# Patient Record
Sex: Female | Born: 1995 | Hispanic: No | Marital: Single | Smoking: Never smoker
Health system: Southern US, Community
[De-identification: ages and names within clinical notes are randomized; demographics above are authoritative.]

## PROBLEM LIST (undated history)

## (undated) ENCOUNTER — Inpatient Hospital Stay (HOSPITAL_COMMUNITY): Payer: Self-pay

## (undated) DIAGNOSIS — F329 Major depressive disorder, single episode, unspecified: Secondary | ICD-10-CM

## (undated) DIAGNOSIS — F419 Anxiety disorder, unspecified: Secondary | ICD-10-CM

## (undated) DIAGNOSIS — F319 Bipolar disorder, unspecified: Secondary | ICD-10-CM

## (undated) DIAGNOSIS — D649 Anemia, unspecified: Secondary | ICD-10-CM

## (undated) DIAGNOSIS — O2301 Infections of kidney in pregnancy, first trimester: Secondary | ICD-10-CM

## (undated) DIAGNOSIS — F32A Depression, unspecified: Secondary | ICD-10-CM

## (undated) DIAGNOSIS — R51 Headache: Secondary | ICD-10-CM

## (undated) DIAGNOSIS — R519 Headache, unspecified: Secondary | ICD-10-CM

## (undated) HISTORY — DX: Anxiety disorder, unspecified: F41.9

## (undated) HISTORY — PX: TONSILLECTOMY: SUR1361

## (undated) HISTORY — DX: Infections of kidney in pregnancy, first trimester: O23.01

---
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
## 2017-01-02 ENCOUNTER — Encounter: Payer: Self-pay | Admitting: Family Medicine

## 2017-02-02 ENCOUNTER — Ambulatory Visit: Payer: Medicaid Other | Admitting: Family Medicine

## 2017-02-15 ENCOUNTER — Encounter: Payer: Self-pay | Admitting: Family Medicine

## 2017-02-15 ENCOUNTER — Other Ambulatory Visit (HOSPITAL_COMMUNITY)
Admission: RE | Admit: 2017-02-15 | Discharge: 2017-02-15 | Disposition: A | Discharge status: Home / Self Care | Payer: Medicaid Other | Source: Ambulatory Visit | Attending: Family Medicine | Admitting: Family Medicine

## 2017-02-15 ENCOUNTER — Ambulatory Visit (INDEPENDENT_AMBULATORY_CARE_PROVIDER_SITE_OTHER): Payer: Medicaid Other | Admitting: Internal Medicine

## 2017-02-15 ENCOUNTER — Encounter: Payer: Self-pay | Admitting: Internal Medicine

## 2017-02-15 VITALS — BP 122/80 | HR 57 | Temp 98.7°F | Ht 62.0 in | Wt 129.8 lb

## 2017-02-15 DIAGNOSIS — N898 Other specified noninflammatory disorders of vagina: Secondary | ICD-10-CM | POA: Diagnosis present

## 2017-02-15 LAB — POCT WET PREP (WET MOUNT)
CLUE CELLS WET PREP WHIFF POC: POSITIVE
TRICHOMONAS WET PREP HPF POC: ABSENT

## 2017-02-15 MED ORDER — METRONIDAZOLE 500 MG PO TABS: 500.0000 mg | ORAL_TABLET | Freq: Two times a day (BID) | ORAL | 0 refills | Status: DC

## 2017-02-15 NOTE — Progress Notes (Signed)
   Subjective:    Tara Gonzalez [place] - 21 y.o. female MRN [id number]  Date of birth: 08/05/1996  HPI  Tara Gonzalez Strayer initially presents for concern for UTI. Reports that symptoms are white, thick vaginal discharge without pruritis. Very minimal odor to the discharge. She denies dysuria, urinary frequency, and urgency. Initially declines STD testing but then asks to be tested. Denies fevers, chills, back pain, nausea and vomiting.   -  reports that she has never smoked. She has never used smokeless tobacco. - Review of Systems: Per HPI. - Past Medical History: Patient Active Problem List   Diagnosis Date Noted  . Grief reaction 09/11/2015  . Abnormal liver function tests 08/01/2015  . Supervision of normal pregnancy in third trimester 05/13/2015  . Family history of high cholesterol 04/16/2013  . Bipolar disorder (HCC) 05/02/2012   - Medications: reviewed and updated   Objective:   Physical Exam BP 122/80 (BP Location: Left Arm, Patient Position: Sitting, Cuff Size: Normal)   Pulse (!) 57   Temp 98.7 F (37.1 C) (Oral)   Ht 5\' 2"  (1.575 m)   Wt 129 lb 12.8 oz (58.9 kg)   SpO2 98%   BMI 23.74 kg/m  Gen: NAD, alert, cooperative with exam, well-appearing Abd: SNTND, BS present, no guarding or organomegaly  Assessment & Plan:   1. Vaginal discharge Patient refused pelvic exam but was willing to perform self collected swabs. Although she initially presented with concern for UTI, patient does not have any symptoms concerning for cystitis and she also refused UA collection. We prep demonstrated BV.  - POCT Wet Prep [facility](Wet [place]) - Cervicovaginal ancillary only - metroNIDAZOLE (FLAGYL) 500 MG tablet; Take 1 tablet (500 mg total) by mouth 2 (two) times daily.  Dispense: 14 tablet; Refill: 0   Marcy Sirenatherine Alezandra Egli, D.O. 02/15/2017, 10:59 AM PGY-2, [facility]

## 2017-02-16 LAB — CERVICOVAGINAL ANCILLARY ONLY
CHLAMYDIA, DNA PROBE: NEGATIVE
NEISSERIA GONORRHEA: NEGATIVE

## 2017-02-23 ENCOUNTER — Encounter: Payer: Self-pay | Admitting: *Deleted

## 2017-03-28 ENCOUNTER — Ambulatory Visit: Payer: Medicaid Other | Admitting: Student in an Organized Health Care Education/Training Program

## 2017-03-28 NOTE — Progress Notes (Deleted)
   CC: ***  HPI: Tara Gonzalez is a 21 y.o. female with PMH significant for bipolar disorder, family history of high cholesterol who [facility] with *** of *** duration.   Overdue health maintenance ***  Review of Symptoms:  See HPI for ROS.   CC, SH/smoking status, and VS noted.  Objective: There were no vitals taken for this visit. GEN: NAD, alert, cooperative, and pleasant.*** EYE: no conjunctival injection, pupils equally round and reactive to light ENMT: normal tympanic light reflex, no nasal polyps,no rhinorrhea, no pharyngeal erythema or exudates NECK: full ROM, no thyromegaly RESPIRATORY: clear to auscultation bilaterally with no wheezes, rhonchi or rales, good effort CV: RRR, no m/r/g, no peripheral edema GI: soft, non-tender, non-distended, no hepatosplenomegaly SKIN: warm and dry, no rashes or lesions NEURO: II-XII grossly intact, normal gait, peripheral sensation intact PSYCH: AAOx3, appropriate affect  Flu Vaccine: *** Tdap Vaccine: *** - every 9527yrs - (<3 lifetime doses or unknown): all wounds -- look up need for Tetanus IG - (>=3 lifetime doses): clean/minor wound if >6027yrs from previous; all other wounds if >476yrs from previous Zoster Vaccine: *** (those >50yo, once) Pneumonia Vaccine: *** (those w/ risk factors) - (<4475yr) Both: Immunocompromised, cochlear implant, CSF leak, asplenic, sickle cell, Chronic Renal Failure - (<1475yr) PPSV-23 only: Heart dz, lung disease, DM, tobacco abuse, alcoholism, cirrhosis/liver disease. - (>6475yr): PPSV13 then PPSV23 in 6-12mths;  - (>3775yr): repeat PPSV23 once if pt received prior to 19yo and 176yrs have passed  Assessment and plan:  No problem-specific Assessment & Plan notes found for this encounter.   No orders of the defined types were placed in this encounter.   No orders of the defined types were placed in this encounter.    Howard PouchLauren Ayden Hardwick, MD,MS,  PGY2 03/28/2017 5:29 AM

## 2017-03-30 ENCOUNTER — Encounter: Payer: Self-pay | Admitting: Student in an Organized Health Care Education/Training Program

## 2017-03-30 ENCOUNTER — Ambulatory Visit (INDEPENDENT_AMBULATORY_CARE_PROVIDER_SITE_OTHER): Payer: Medicaid Other | Admitting: Student in an Organized Health Care Education/Training Program

## 2017-03-30 VITALS — BP 120/52 | HR 85 | Temp 98.5°F | Wt 130.8 lb

## 2017-03-30 DIAGNOSIS — Z3491 Encounter for supervision of normal pregnancy, unspecified, first trimester: Secondary | ICD-10-CM | POA: Diagnosis not present

## 2017-03-30 DIAGNOSIS — N912 Amenorrhea, unspecified: Secondary | ICD-10-CM | POA: Diagnosis not present

## 2017-03-30 DIAGNOSIS — Z3490 Encounter for supervision of normal pregnancy, unspecified, unspecified trimester: Secondary | ICD-10-CM | POA: Insufficient documentation

## 2017-03-30 LAB — POCT URINE PREGNANCY: PREG TEST UR: POSITIVE — AB

## 2017-03-30 NOTE — Patient Instructions (Signed)
Congratulations on your pregnancy! It was a pleasure seeing you today in our clinic.   - On your way out today please schedule a lab visit for your initial prenatal blood work and urine studies - Please schedule an initial OB visit for about 4-6 weeks from now  Our clinic's number is [phone]. Please call with questions or concerns about what we discussed today.  Be well, Dr. Mosetta PuttFeng

## 2017-03-30 NOTE — Progress Notes (Signed)
   CC: pregnancy test  HPI: Dagmar Haityana Dorey is a 21 y.o. female with [facility] pregnancy test.  Review of Symptoms:  See HPI for ROS.   CC, SH/smoking status, and VS noted.  Objective: BP (!) 120/52   Pulse 85   Temp 98.5 F (36.9 C) (Oral)   Wt 130 lb 12.8 oz (59.3 kg)   SpO2 100%   BMI 23.92 kg/m  GEN: NAD, alert, cooperative, and pleasant. RESPIRATORY: clear to auscultation bilaterally with no wheezes, rhonchi or rales, good effort CV: RRR, no m/r/g, no peripheral edema GI: soft, non-tender, non-distended, no hepatosplenomegaly  Assessment and plan:  Pregnant and not yet delivered in first trimester - dates ~[redacted] weeks pregnant by LMP - recommend prenatal vitamin - OB labs ordered, patient prefers to come back for future lab visit - f/u in 4 weeks for initial prenatal visit - return precautions advised   Orders Placed This Encounter  Procedures  . Culture, OB Urine    Standing Status:   Future    Standing Expiration Date:   03/30/2018  . Obstetric Panel, Including HIV    Standing Status:   Future    Standing Expiration Date:   03/30/2018  . POCT urine pregnancy  . POCT urinalysis dipstick    future   Howard PouchLauren Shantera Monts, MD,MS,  PGY2 03/30/2017 5:30 PM

## 2017-03-30 NOTE — Assessment & Plan Note (Signed)
-   dates ~[redacted] weeks pregnant by LMP - recommend prenatal vitamin - OB labs ordered, patient prefers to come back for future lab visit - f/u in 4 weeks for initial prenatal visit - return precautions advised

## 2017-03-31 ENCOUNTER — Other Ambulatory Visit: Payer: Medicaid Other

## 2017-03-31 DIAGNOSIS — Z3491 Encounter for supervision of normal pregnancy, unspecified, first trimester: Secondary | ICD-10-CM

## 2017-04-01 LAB — OBSTETRIC PANEL, INCLUDING HIV
Antibody Screen: NEGATIVE
BASOS ABS: 0 10*3/uL (ref 0.0–0.2)
Basos: 0 %
EOS (ABSOLUTE): 0.1 10*3/uL (ref 0.0–0.4)
Eos: 1 %
HIV SCREEN 4TH GENERATION: NONREACTIVE
Hematocrit: 38.4 % (ref 34.0–46.6)
Hemoglobin: 12.3 g/dL (ref 11.1–15.9)
Hepatitis B Surface Ag: NEGATIVE
Immature Grans (Abs): 0 10*3/uL (ref 0.0–0.1)
Immature Granulocytes: 0 %
LYMPHS ABS: 2.1 10*3/uL (ref 0.7–3.1)
Lymphs: 24 %
MCH: 29.7 pg (ref 26.6–33.0)
MCHC: 32 g/dL (ref 31.5–35.7)
MCV: 93 fL (ref 79–97)
Monocytes Absolute: 0.6 10*3/uL (ref 0.1–0.9)
Monocytes: 7 %
NEUTROS ABS: 5.9 10*3/uL (ref 1.4–7.0)
Neutrophils: 68 %
PLATELETS: 212 10*3/uL (ref 150–379)
RBC: 4.14 x10E6/uL (ref 3.77–5.28)
RDW: 13.7 % (ref 12.3–15.4)
RPR: NONREACTIVE
Rh Factor: POSITIVE
Rubella Antibodies, IGG: 4.82 index (ref >0.99)
WBC: 8.7 10*3/uL (ref 3.4–10.8)

## 2017-04-03 LAB — URINE CULTURE, OB REFLEX

## 2017-04-03 LAB — CULTURE, OB URINE

## 2017-04-05 ENCOUNTER — Other Ambulatory Visit: Payer: Self-pay | Admitting: Student in an Organized Health Care Education/Training Program

## 2017-04-05 MED ORDER — CEFDINIR 300 MG PO CAPS: 300.0000 mg | ORAL_CAPSULE | Freq: Two times a day (BID) | ORAL | 0 refills | Status: DC

## 2017-04-05 NOTE — Progress Notes (Unsigned)
Called patient regarding positive urine culture on OB screening. No option to LVM on her machine. Emergency contact is her mother who was present at the patient's lab visit. I called to get a good phone number for the patient, however the mother's phone numbers both went to voicemail. LVM asking patient to call back the office.  It is a appropriate to treat as a UTI despite being asymptomatic because she is pregnant. I spoke with pharmacy because the bacteria is not sensitive to several antibiotics, and will plan to treat with cefdinir for 10 days.   Will send 10 day course to pharmacy. Will continue to try to get in touch with patient. Plan for test of cure urine at first prenatal visit.

## 2017-04-06 ENCOUNTER — Telehealth: Payer: Self-pay | Admitting: Student in an Organized Health Care Education/Training Program

## 2017-04-06 NOTE — Telephone Encounter (Signed)
Attempted to call patient, patient's mom at home and at her mobile number again this AM. Unable to reach anyone.  Will forward to CMA's - please try to get in contact with patient and let her know that she has bacteria in her urine that needs to be treated with antibiotics because she is pregnant. I sent a course of antibiotics to her pharmacy.

## 2017-04-07 NOTE — Telephone Encounter (Signed)
Pt mom informed at visit on 04/07/17 when she brought pt son in. Lamonte SakaiZimmerman Rumple, April D, [place]

## 2017-04-10 ENCOUNTER — Encounter: Payer: Self-pay | Admitting: Student in an Organized Health Care Education/Training Program

## 2017-04-19 ENCOUNTER — Ambulatory Visit: Payer: Medicaid Other | Admitting: Student in an Organized Health Care Education/Training Program

## 2017-04-19 ENCOUNTER — Encounter: Payer: Self-pay | Admitting: Student in an Organized Health Care Education/Training Program

## 2017-04-19 ENCOUNTER — Ambulatory Visit (INDEPENDENT_AMBULATORY_CARE_PROVIDER_SITE_OTHER): Payer: Medicaid Other | Admitting: Student in an Organized Health Care Education/Training Program

## 2017-04-19 VITALS — BP 118/70 | HR 94 | Temp 97.9°F | Wt 129.4 lb

## 2017-04-19 DIAGNOSIS — R8290 Unspecified abnormal findings in urine: Secondary | ICD-10-CM | POA: Diagnosis not present

## 2017-04-19 DIAGNOSIS — R8271 Bacteriuria: Secondary | ICD-10-CM

## 2017-04-19 MED ORDER — VITAMIN B-6 25 MG PO TABS: 25.0000 mg | ORAL_TABLET | Freq: Every day | ORAL | 0 refills | Status: DC

## 2017-04-19 MED ORDER — CEFDINIR 300 MG PO CAPS: 300.0000 mg | ORAL_CAPSULE | Freq: Two times a day (BID) | ORAL | 0 refills | Status: DC

## 2017-04-19 MED ORDER — CEFTRIAXONE SODIUM 1 G IJ SOLR
1.0000 g | Freq: Once | INTRAMUSCULAR | Status: AC
  Administered 2017-04-19: 1 g via INTRAMUSCULAR

## 2017-04-19 NOTE — Patient Instructions (Signed)
It was a pleasure seeing you today in our clinic. Today we discussed the bacteria in your urine. Here is the treatment plan we have discussed and agreed upon together:  - I sent a script to your pharmacy for antibiotics. You will also receive antibiotics as a shot today in the office. - I look forward to seeing you at your first OB prenatal visit on Monday!   Our clinic's number is [phone]. Please call with questions or concerns about what we discussed today.  Be well, Dr. Mosetta PuttFeng

## 2017-04-24 ENCOUNTER — Other Ambulatory Visit (HOSPITAL_COMMUNITY)
Admission: RE | Admit: 2017-04-24 | Discharge: 2017-04-24 | Disposition: A | Discharge status: Home / Self Care | Payer: Medicaid Other | Source: Ambulatory Visit | Attending: Family Medicine | Admitting: Family Medicine

## 2017-04-24 ENCOUNTER — Ambulatory Visit (INDEPENDENT_AMBULATORY_CARE_PROVIDER_SITE_OTHER): Payer: Medicaid Other | Admitting: Student in an Organized Health Care Education/Training Program

## 2017-04-24 ENCOUNTER — Encounter: Payer: Self-pay | Admitting: Student in an Organized Health Care Education/Training Program

## 2017-04-24 VITALS — BP 102/60 | HR 71 | Temp 98.3°F | Wt 131.4 lb

## 2017-04-24 DIAGNOSIS — Z3A08 8 weeks gestation of pregnancy: Secondary | ICD-10-CM | POA: Diagnosis not present

## 2017-04-24 DIAGNOSIS — Z3481 Encounter for supervision of other normal pregnancy, first trimester: Secondary | ICD-10-CM | POA: Diagnosis not present

## 2017-04-24 MED ORDER — COMPLETENATE 29-1 MG PO CHEW: 1.0000 | CHEWABLE_TABLET | Freq: Every day | ORAL | 6 refills | Status: DC

## 2017-04-24 NOTE — Progress Notes (Signed)
Tara Gonzalez is a 21 y.o. yo G2P1001 at Unknown who presents for her initial prenatal visit. Pregnancy is not planned She reports morning sickness. She  Is not taking PNV - asks that a chewable PNC be prescribed. See flow sheet for details.  PMH, POBH, FH, meds, allergies and Social Hx reviewed.  PMH  POBH Patient has one prior pregnancy delivered with induction for postdates. Complications during that pregnancy include pyelonephritis requiring hospitalization for abx. No high blood pressures or high sugars during that pregnancy per patient report.  Social Hx Lives at home with Mom and two brothers.  Prenatal Exam: Gen: Well nourished, well developed.  No distress.  Vitals noted. HEENT: Normocephalic, atraumatic.  Neck supple without cervical lymphadenopathy, thyromegaly or thyroid nodules.  Fair dentition. CV: RRR no murmur, gallops or rubs Lungs: CTAB.  Normal respiratory effort without wheezes or rales. Abd: soft, NTND. +BS.  Uterus not appreciated above pelvis. GU: Normal external female genitalia without lesions.  Normal vaginal, well rugated without lesions. No vaginal discharge.  Bimanual exam: No adnexal mass or TTP. No CMT.  Uterus size 2-3 finger breadths below umbilicus Ext: No clubbing, cyanosis or edema. Psych: Normal grooming and dress.  Not depressed or anxious appearing.  Normal thought content and process without flight of ideas or looseness of associations.  Assessment & Plan: 1) 21 y.o. yo G2P1001 at 8 weeks via LMP doing well.  Current pregnancy issues include morning sickness - B6 prescribed. - Dating is not reliable. Ultrasound ordered at today's visit - Prenatal labs reviewed, notable for asymptomatic bacteruria, currently completing antibiotic course. Plan for TOC at next visit. - Genetic screening offered: patient declines. - Early glucola is not indicated.  - Patient has a history of +GC/Chla - will retest at today's visit - denies pain, loss of fluid, or  bleeding - PHQ-9 and Pregnancy Medical Home forms completed and reviewed. PHQ9 score of 3 at today's visit.  Bleeding and pain precautions reviewed. Importance of prenatal vitamins reviewed.  Follow up in 4 weeks.

## 2017-04-24 NOTE — Patient Instructions (Addendum)
It was a pleasure seeing you today in our clinic. Congratulations on your pregnancy! Here is the treatment plan we have discussed and agreed upon together:  - Please complete your course of antibiotics. We can retest your urine at your follow up visit - You will need an ultrasound for reliable dating for your pregnancy - I sent prenatal vitamins (chewable) to your pharmacy. You should take these daily. - We drew labs at today's visit. I can call you with these results. - If you notice bleeding or severe pain, these would be reasons to seek medical assistance right away.  Please schedule your next OB visit for 4 weeks from now.  Our clinic's number is [phone]. Please call with questions or concerns about what we discussed today.   Be well, Dr. Mosetta Putt

## 2017-04-24 NOTE — Progress Notes (Signed)
   CC: Urinary Tract Infection  HPI: Tara Gonzalez is a 21 y.o. female who is pregnant and was found to have asymptomatic bacturia. She was contacted to be treated outpatient however did not fill her prescriptions and presents today for medication and bacturia treatment.  Urine cultures from initial OB 7/27 notable for 50-100K species enterobacter aerogenes, sensitive to ceftriaxone and cefepime.   Patient denies all urinary symptoms.  Attempts were made to notify the patient of these results. Eventually she was contacted and she came in today to the office.  Review of Symptoms:  See HPI for ROS.   CC, SH/smoking status, and VS noted.  Objective: BP 118/70   Pulse 94   Temp 97.9 F (36.6 C)   Wt 129 lb 6.4 oz (58.7 kg)   LMP 02/28/2017   BMI 23.67 kg/m  GEN: NAD, alert, cooperative, and pleasant. PSYCH: AAOx3, appropriate affect  Assessment and plan:  Asymptomatic bacteriuria - ceftriaxone 1g in the office today - omnicef 300 mg BID x10 days - TOC at 12 week OB visit  Patient has initial OB visit scheduled for Monday. Gave B6 today because she mentions morning sickness.  Meds ordered this encounter  Medications  . cefdinir (OMNICEF) 300 MG capsule    Sig: Take 1 capsule (300 mg total) by mouth 2 (two) times daily.    Dispense:  20 capsule    Refill:  0  . vitamin B-6 (PYRIDOXINE) 25 MG tablet    Sig: Take 1 tablet (25 mg total) by mouth daily.    Dispense:  30 tablet    Refill:  0  . cefTRIAXone (ROCEPHIN) injection 1 g     Howard Pouch, MD,MS,  PGY2 04/24/2017 3:11 PM

## 2017-04-24 NOTE — Assessment & Plan Note (Signed)
-   ceftriaxone 1g in the office today - omnicef 300 mg BID x10 days - TOC at 12 week OB visit

## 2017-04-26 ENCOUNTER — Encounter: Payer: Self-pay | Admitting: Student in an Organized Health Care Education/Training Program

## 2017-04-26 LAB — CERVICOVAGINAL ANCILLARY ONLY
CHLAMYDIA, DNA PROBE: NEGATIVE
NEISSERIA GONORRHEA: NEGATIVE

## 2017-05-02 ENCOUNTER — Ambulatory Visit (HOSPITAL_COMMUNITY)
Admission: RE | Admit: 2017-05-02 | Discharge: 2017-05-02 | Disposition: A | Discharge status: Home / Self Care | Payer: Medicaid Other | Source: Ambulatory Visit | Attending: Family Medicine | Admitting: Family Medicine

## 2017-05-02 DIAGNOSIS — Z3689 Encounter for other specified antenatal screening: Secondary | ICD-10-CM | POA: Diagnosis not present

## 2017-05-02 DIAGNOSIS — Z3A08 8 weeks gestation of pregnancy: Secondary | ICD-10-CM

## 2017-05-04 ENCOUNTER — Inpatient Hospital Stay (HOSPITAL_COMMUNITY)
Admission: AD | Admit: 2017-05-04 | Discharge: 2017-05-04 | Disposition: A | Discharge status: Home / Self Care | Payer: Medicaid Other | Source: Ambulatory Visit | Attending: Obstetrics & Gynecology | Admitting: Obstetrics & Gynecology

## 2017-05-04 ENCOUNTER — Encounter (HOSPITAL_COMMUNITY): Payer: Self-pay

## 2017-05-04 DIAGNOSIS — F319 Bipolar disorder, unspecified: Secondary | ICD-10-CM | POA: Diagnosis not present

## 2017-05-04 DIAGNOSIS — R1010 Upper abdominal pain, unspecified: Secondary | ICD-10-CM | POA: Diagnosis not present

## 2017-05-04 DIAGNOSIS — F419 Anxiety disorder, unspecified: Secondary | ICD-10-CM | POA: Diagnosis not present

## 2017-05-04 DIAGNOSIS — Z79899 Other long term (current) drug therapy: Secondary | ICD-10-CM | POA: Insufficient documentation

## 2017-05-04 DIAGNOSIS — R12 Heartburn: Secondary | ICD-10-CM | POA: Insufficient documentation

## 2017-05-04 DIAGNOSIS — O26892 Other specified pregnancy related conditions, second trimester: Secondary | ICD-10-CM | POA: Insufficient documentation

## 2017-05-04 DIAGNOSIS — O99342 Other mental disorders complicating pregnancy, second trimester: Secondary | ICD-10-CM | POA: Diagnosis not present

## 2017-05-04 DIAGNOSIS — O212 Late vomiting of pregnancy: Secondary | ICD-10-CM | POA: Diagnosis present

## 2017-05-04 DIAGNOSIS — O219 Vomiting of pregnancy, unspecified: Secondary | ICD-10-CM

## 2017-05-04 DIAGNOSIS — Z3A09 9 weeks gestation of pregnancy: Secondary | ICD-10-CM | POA: Insufficient documentation

## 2017-05-04 HISTORY — DX: Headache: R51

## 2017-05-04 HISTORY — DX: Depression, unspecified: F32.A

## 2017-05-04 HISTORY — DX: Anemia, unspecified: D64.9

## 2017-05-04 HISTORY — DX: Headache, unspecified: R51.9

## 2017-05-04 HISTORY — DX: Major depressive disorder, single episode, unspecified: F32.9

## 2017-05-04 LAB — COMPREHENSIVE METABOLIC PANEL
ALBUMIN: 4.8 g/dL (ref 3.5–5.0)
ALK PHOS: 62 U/L (ref 38–126)
ALT: 19 U/L (ref 14–54)
AST: 28 U/L (ref 15–41)
Anion gap: 11 (ref 5–15)
BILIRUBIN TOTAL: 1 mg/dL (ref 0.3–1.2)
BUN: 9 mg/dL (ref 6–20)
CO2: 22 mmol/L (ref 22–32)
CREATININE: 0.54 mg/dL (ref 0.44–1.00)
Calcium: 9.9 mg/dL (ref 8.9–10.3)
Chloride: 102 mmol/L (ref 101–111)
GFR calc Af Amer: >60 mL/min (ref >60)
GFR calc non Af Amer: >60 mL/min (ref >60)
GLUCOSE: 92 mg/dL (ref 65–99)
POTASSIUM: 4.1 mmol/L (ref 3.5–5.1)
Sodium: 135 mmol/L (ref 135–145)
TOTAL PROTEIN: 8.2 g/dL — AB (ref 6.5–8.1)

## 2017-05-04 LAB — CBC WITH DIFFERENTIAL/PLATELET
BASOS PCT: 0 %
Basophils Absolute: 0 10*3/uL (ref 0.0–0.1)
Eosinophils Absolute: 0.1 10*3/uL (ref 0.0–0.7)
Eosinophils Relative: 1 %
HEMATOCRIT: 39 % (ref 36.0–46.0)
Hemoglobin: 13.7 g/dL (ref 12.0–15.0)
LYMPHS PCT: 25 %
Lymphs Abs: 2.2 10*3/uL (ref 0.7–4.0)
MCH: 30.9 pg (ref 26.0–34.0)
MCHC: 35.1 g/dL (ref 30.0–36.0)
MCV: 87.8 fL (ref 78.0–100.0)
Monocytes Absolute: 0.4 10*3/uL (ref 0.1–1.0)
Monocytes Relative: 4 %
NEUTROS ABS: 6.2 10*3/uL (ref 1.7–7.7)
NEUTROS PCT: 70 %
Platelets: 249 10*3/uL (ref 150–400)
RBC: 4.44 MIL/uL (ref 3.87–5.11)
RDW: 13 % (ref 11.5–15.5)
WBC: 8.8 10*3/uL (ref 4.0–10.5)

## 2017-05-04 LAB — URINALYSIS, ROUTINE W REFLEX MICROSCOPIC
Bacteria, UA: NONE SEEN
Bilirubin Urine: NEGATIVE
Glucose, UA: NEGATIVE mg/dL
Hgb urine dipstick: NEGATIVE
KETONES UR: NEGATIVE mg/dL
Nitrite: NEGATIVE
Protein, ur: 30 mg/dL — AB
Specific Gravity, Urine: 1.023 (ref 1.005–1.030)
pH: 7 (ref 5.0–8.0)

## 2017-05-04 LAB — LIPASE, BLOOD: Lipase: 23 U/L (ref 11–51)

## 2017-05-04 MED ORDER — METOCLOPRAMIDE HCL 5 MG/ML IJ SOLN
10.0000 mg | Freq: Once | INTRAMUSCULAR | Status: AC
  Administered 2017-05-04: 10 mg via INTRAVENOUS
  Filled 2017-05-04: qty 2

## 2017-05-04 MED ORDER — FAMOTIDINE IN NACL 20-0.9 MG/50ML-% IV SOLN
20.0000 mg | Freq: Once | INTRAVENOUS | Status: AC
  Administered 2017-05-04: 20 mg via INTRAVENOUS
  Filled 2017-05-04: qty 50

## 2017-05-04 MED ORDER — PROMETHAZINE HCL 25 MG PO TABS: 25.0000 mg | ORAL_TABLET | Freq: Four times a day (QID) | ORAL | 0 refills | Status: DC | PRN

## 2017-05-04 MED ORDER — PROMETHAZINE HCL 25 MG/ML IJ SOLN
25.0000 mg | Freq: Once | INTRAVENOUS | Status: AC
  Administered 2017-05-04: 25 mg via INTRAVENOUS
  Filled 2017-05-04: qty 1

## 2017-05-04 MED ORDER — METOCLOPRAMIDE HCL 10 MG PO TABS: 10.0000 mg | ORAL_TABLET | Freq: Four times a day (QID) | ORAL | 0 refills | Status: DC

## 2017-05-04 MED ORDER — LACTATED RINGERS IV BOLUS (SEPSIS)
1000.0000 mL | Freq: Once | INTRAVENOUS | Status: AC
  Administered 2017-05-04: 1000 mL via INTRAVENOUS

## 2017-05-04 MED ORDER — FAMOTIDINE 20 MG PO TABS: 20.0000 mg | ORAL_TABLET | Freq: Two times a day (BID) | ORAL | 2 refills | Status: DC

## 2017-05-04 NOTE — MAU Provider Note (Signed)
History     CSN: [id number]  Arrival date and time: 05/04/17 0927   First Provider Initiated Contact with Patient 05/04/17 1003      Chief Complaint  Patient presents with  . Emesis   HPI  Ms. Tara Gonzalez is a 21 y.o. G2P1001 at 8372w2d who presents to MAU today with complaint of nausea and vomiting. This has been an issue throughout the pregnancy, but worse over the last few days. She has been taking B6 but does not feel it is helping. She has had heartburn and upper abdominal pain associated with this. She denies lower abdominal pain, vaginal bleeding or fever.    OB History    Gravida Para Term Preterm AB Living   2 1 1     1    SAB TAB Ectopic Multiple Live Births         0 1      Past Medical History:  Diagnosis Date  . Anemia    during last pregnancy diagnosed   . Anxiety   . Bipolar disorder (HCC)   . Bipolar disorder (HCC)   . Depression    on going started diagnosed as child. not currently on medication   . Headache   . Pyelonephritis affecting pregnancy in first trimester     Past Surgical History:  Procedure Laterality Date  . TONSILLECTOMY      Family History  Problem Relation Age of Onset  . Hypertension Mother   . Hyperlipidemia Mother   . Diabetes Maternal Grandmother        type II  . Congenital Murmur Maternal Grandmother        with VSD  . Diabetes Maternal Grandfather        type II  . Asthma Brother   . Autism spectrum disorder Brother     Social History  Substance Use Topics  . Smoking status: Never Smoker  . Smokeless tobacco: Never Used  . Alcohol use No    Allergies: No Known Allergies  Prescriptions Prior to Admission  Medication Sig Dispense Refill Last Dose  . cefdinir (OMNICEF) 300 MG capsule Take 1 capsule (300 mg total) by mouth 2 (two) times daily. 20 capsule 0 05/03/2017 at Unknown time  . vitamin B-6 (PYRIDOXINE) 25 MG tablet Take 1 tablet (25 mg total) by mouth daily. 30 tablet 0 Past Week at Unknown time  .  acetaminophen (TYLENOL) 325 MG tablet Take 2 tablets (650 mg total) by mouth every 4 (four) hours as needed (for pain scale < 4). (Patient not taking: Reported on 05/04/2017) 60 tablet 1 Not Taking at Unknown time  . prenatal vitamin w/FE, FA (NATACHEW) 29-1 MG CHEW chewable tablet Chew 1 tablet by mouth daily at 12 noon. (Patient not taking: Reported on 05/04/2017) 30 tablet 6 Not Taking at Unknown time    Review of Systems  Constitutional: Negative for fever.  Gastrointestinal: Positive for abdominal pain, nausea and vomiting. Negative for constipation and diarrhea.  Genitourinary: Negative for dysuria, vaginal bleeding and vaginal discharge.   Physical Exam   Blood pressure 117/68, pulse 75, temperature 98.2 F (36.8 C), temperature source Oral, resp. rate 16, weight 125 lb 8 oz (56.9 kg), last menstrual period 02/28/2017, unknown if currently breastfeeding.  Physical Exam  Nursing note and vitals reviewed. Constitutional: She is oriented to person, place, and time. She appears well-developed and well-nourished. No distress.  HENT:  Head: Normocephalic and atraumatic.  Cardiovascular: Normal rate.   Respiratory: Effort normal.  GI: Soft. Bowel  sounds are normal. She exhibits no distension and no mass. There is tenderness (mild epigastric). There is no rebound and no guarding.  Neurological: She is alert and oriented to person, place, and time.  Skin: Skin is warm and dry. No erythema.  Psychiatric: She has a normal mood and affect.    Results for orders placed or performed during the hospital encounter of 05/04/17 (from the past 24 hour(s))  Urinalysis, Routine w reflex microscopic     Status: Abnormal   Collection Time: 05/04/17  9:50 AM  Result Value Ref Range   Color, Urine YELLOW YELLOW   APPearance HAZY (A) CLEAR   Specific Gravity, Urine 1.023 1.005 - 1.030   pH 7.0 5.0 - 8.0   Glucose, UA NEGATIVE NEGATIVE mg/dL   Hgb urine dipstick NEGATIVE NEGATIVE   Bilirubin Urine  NEGATIVE NEGATIVE   Ketones, ur NEGATIVE NEGATIVE mg/dL   Protein, ur 30 (A) NEGATIVE mg/dL   Nitrite NEGATIVE NEGATIVE   Leukocytes, UA MODERATE (A) NEGATIVE   RBC / HPF 0-5 0 - 5 RBC/hpf   WBC, UA 0-5 0 - 5 WBC/hpf   Bacteria, UA NONE SEEN NONE SEEN   Squamous Epithelial / LPF 0-5 (A) NONE SEEN   Mucus PRESENT   CBC with Differential/Platelet     Status: None   Collection Time: 05/04/17 10:50 AM  Result Value Ref Range   WBC 8.8 4.0 - 10.5 K/uL   RBC 4.44 3.87 - 5.11 MIL/uL   Hemoglobin 13.7 12.0 - 15.0 g/dL   HCT 40.9 81.1 - 91.4 %   MCV 87.8 78.0 - 100.0 fL   MCH 30.9 26.0 - 34.0 pg   MCHC 35.1 30.0 - 36.0 g/dL   RDW 78.2 95.6 - 21.3 %   Platelets 249 150 - 400 K/uL   Neutrophils Relative % 70 %   Neutro Abs 6.2 1.7 - 7.7 K/uL   Lymphocytes Relative 25 %   Lymphs Abs 2.2 0.7 - 4.0 K/uL   Monocytes Relative 4 %   Monocytes Absolute 0.4 0.1 - 1.0 K/uL   Eosinophils Relative 1 %   Eosinophils Absolute 0.1 0.0 - 0.7 K/uL   Basophils Relative 0 %   Basophils Absolute 0.0 0.0 - 0.1 K/uL  Comprehensive metabolic panel     Status: Abnormal   Collection Time: 05/04/17 10:50 AM  Result Value Ref Range   Sodium 135 135 - 145 mmol/L   Potassium 4.1 3.5 - 5.1 mmol/L   Chloride 102 101 - 111 mmol/L   CO2 22 22 - 32 mmol/L   Glucose, Bld 92 65 - 99 mg/dL   BUN 9 6 - 20 mg/dL   Creatinine, Ser 0.86 0.44 - 1.00 mg/dL   Calcium 9.9 8.9 - 57.8 mg/dL   Total Protein 8.2 (H) 6.5 - 8.1 g/dL   Albumin 4.8 3.5 - 5.0 g/dL   AST 28 15 - 41 U/L   ALT 19 14 - 54 U/L   Alkaline Phosphatase 62 38 - 126 U/L   Total Bilirubin 1.0 0.3 - 1.2 mg/dL   GFR calc non Af Amer >60 >60 mL/min   GFR calc Af Amer >60 >60 mL/min   Anion gap 11 5 - 15  Lipase, blood     Status: None   Collection Time: 05/04/17 10:50 AM  Result Value Ref Range   Lipase 23 11 - 51 U/L    MAU Course  Procedures None  MDM UA, CBC, CMP today  IV fluids - 2  liters IV LR given  Phenergan 25 mg infusion 10 mg Reglan  given Patient able to tolerate PO while in MAU  Assessment and Plan  A: SIUP at [redacted]w[redacted]d Nausea and vomiting in pregnancy prior to [redacted] weeks gestation   P:  Discharge home Rx for Phenergan, Reglan and Pepcid given/sent to patient's pharmacy  Diet for N/V in pregnancy discussed and included on AVS Patient advised to follow-up with [facility] as scheduled for routine prenatal visits or sooner if symptoms worsen Patient may return to MAU as needed or if her condition were to change or worsen   Vonzella Nipple, PA-C 05/04/2017, 2:51 PM

## 2017-05-04 NOTE — Discharge Instructions (Signed)
Morning Sickness °Morning sickness is when you feel sick to your stomach (nauseous) during pregnancy. You may feel sick to your stomach and throw up (vomit). You may feel sick in the morning, but you can feel this way any time of day. Some women feel very sick to their stomach and cannot stop throwing up (hyperemesis gravidarum). °Follow these instructions at home: °· Only take medicines as told by your doctor. °· Take multivitamins as told by your doctor. Taking multivitamins before getting pregnant can stop or lessen the harshness of morning sickness. °· Eat dry toast or unsalted crackers before getting out of bed. °· Eat 5 to 6 small meals a day. °· Eat dry and bland foods like rice and baked potatoes. °· Do not drink liquids with meals. Drink between meals. °· Do not eat greasy, fatty, or spicy foods. °· Have someone cook for you if the smell of food causes you to feel sick or throw up. °· If you feel sick to your stomach after taking prenatal vitamins, take them at night or with a snack. °· Eat protein when you need a snack (nuts, yogurt, cheese). °· Eat unsweetened gelatins for dessert. °· Wear a bracelet used for sea sickness (acupressure wristband). °· Go to a doctor that puts thin needles into certain body points (acupuncture) to improve how you feel. °· Do not smoke. °· Use a humidifier to keep the air in your house free of odors. °· Get lots of fresh air. °Contact a doctor if: °· You need medicine to feel better. °· You feel dizzy or lightheaded. °· You are losing weight. °Get help right away if: °· You feel very sick to your stomach and cannot stop throwing up. °· You pass out (faint). °This information is not intended to replace advice given to you by your health care provider. Make sure you discuss any questions you have with your health care provider. °Document Released: 09/29/2004 Document Revised: 01/28/2016 Document Reviewed: 02/06/2013 °Elsevier Interactive Patient Education © 2017 [employer] °Eating Plan for Hyperemesis Gravidarum °Hyperemesis gravidarum is a severe form of morning sickness. Because this condition causes severe nausea and vomiting, it can lead to dehydration, malnutrition, and weight loss. One way to lessen the symptoms of nausea and vomiting is to follow the eating plan for hyperemesis gravidarum. It is often used along with prescribed medicines to control your symptoms. °What can I do to relieve my symptoms? °Listen to your body. Everyone is different and has different preferences. Find what works best for you. Take any of the following actions that are helpful to you: °· Eat and drink slowly. °· Eat 5-6 small meals daily instead of 3 large meals. °· Eat crackers before you get out of bed in the morning. °· Try having a snack in the middle of the night. °· Starchy foods are usually tolerated well. Examples include cereal, toast, bread, potatoes, pasta, rice, and pretzels. °· Ginger may help with nausea. Add ¼ tsp ground ginger to hot tea or choose ginger tea. °· Try drinking 100% fruit juice or an electrolyte drink. An electrolyte drink contains sodium, potassium, and chloride. °· Continue to take your prenatal vitamins as told by your health care provider. If you are having trouble taking your prenatal vitamins, talk with your health care provider about different options. °· Include at least 1 serving of protein with your meals and snacks. Protein options include meats or poultry, beans, nuts, eggs, and yogurt. Try eating a protein-rich snack before bed. Examples   of these snacks include cheese and crackers or half of a peanut butter or turkey sandwich. °· Consider eliminating foods that trigger your symptoms. These may include spicy foods, coffee, high-fat foods, very sweet foods, and acidic foods. °· Try meals that have more protein combined with bland, salty, lower-fat, and dry foods, such as nuts, seeds, pretzels, crackers, and cereal. °· Talk with your healthcare provider  about starting a supplement of vitamin B6. °· Have fluids that are cold, clear, and carbonated or sour. Examples include lemonade, ginger ale, lemon-lime soda, ice water, and sparkling water. °· Try lemon or mint tea. °· Try brushing your teeth or using a mouth rinse after meals. ° °What should I avoid to reduce my symptoms? °Avoiding some of the following things may help reduce your symptoms. °· Foods with strong smells. Try eating meals in well-ventilated areas that are free of odors. °· Drinking water or other beverages with meals. Try not to drink anything during the 30 minutes before and after your meals. °· Drinking more than 1 cup of fluid at a time. Sometimes using a straw helps. °· Fried or high-fat foods, such as butter and cream sauces. °· Spicy foods. °· Skipping meals as best as you can. Nausea can be more intense on an empty stomach. If you cannot tolerate food at that time, do not force it. Try sucking on ice chips or other frozen items, and make up for missed calories later. °· Lying down within 2 hours after eating. °· Environmental triggers. These may include smoky rooms, closed spaces, rooms with strong smells, warm or humid places, overly loud and noisy rooms, and rooms with motion or flickering lights. °· Quick and sudden changes in your movement. ° °This information is not intended to replace advice given to you by your health care provider. Make sure you discuss any questions you have with your health care provider. °Document Released: 06/19/2007 Document Revised: 04/20/2016 Document Reviewed: 03/22/2016 °Elsevier Interactive Patient Education © 2018 Elsevier Inc. ° °

## 2017-05-04 NOTE — MAU Note (Signed)
Throwing up non stop past week. Can't hold anything down. Pain in upper abdomen and upper back. Had kidney infection with last pregnancy. Feeling like having another one now. Feels like body is "shuting down".

## 2017-05-18 ENCOUNTER — Other Ambulatory Visit: Payer: Self-pay | Admitting: Student in an Organized Health Care Education/Training Program

## 2017-05-18 ENCOUNTER — Telehealth: Payer: Self-pay | Admitting: Student in an Organized Health Care Education/Training Program

## 2017-05-18 DIAGNOSIS — O210 Mild hyperemesis gravidarum: Secondary | ICD-10-CM

## 2017-05-18 MED ORDER — PROMETHAZINE HCL 25 MG PO TABS: 25.0000 mg | ORAL_TABLET | Freq: Three times a day (TID) | ORAL | 0 refills | Status: DC | PRN

## 2017-05-18 NOTE — Telephone Encounter (Signed)
Precepted with Dr. Leveda AnnaHensel and Dr. Doroteo GlassmanPhelps. Phenergan refilled.

## 2017-05-18 NOTE — Telephone Encounter (Signed)
Pt informed. Sharon T Saunders, CMA  

## 2017-05-18 NOTE — Telephone Encounter (Signed)
Mother is calling to get a refill on her daughter's Phenergan. She is pregnant and can not keep any food down. She went to MAU and they gave her the Phenergan. She has been on nausea medication with her first pregnancy. Please call ASAP so that they know we are sending this in. jw

## 2017-05-18 NOTE — Telephone Encounter (Signed)
Please call and let the patient know I refilled her phenergan.  Thank you, LF

## 2017-05-20 ENCOUNTER — Inpatient Hospital Stay (HOSPITAL_COMMUNITY)
Admission: AD | Admit: 2017-05-20 | Discharge: 2017-05-20 | Disposition: A | Discharge status: Home / Self Care | Payer: Medicaid Other | Source: Ambulatory Visit | Attending: Obstetrics and Gynecology | Admitting: Obstetrics and Gynecology

## 2017-05-20 ENCOUNTER — Encounter (HOSPITAL_COMMUNITY): Payer: Self-pay

## 2017-05-20 DIAGNOSIS — Z3A11 11 weeks gestation of pregnancy: Secondary | ICD-10-CM | POA: Diagnosis not present

## 2017-05-20 DIAGNOSIS — R1010 Upper abdominal pain, unspecified: Secondary | ICD-10-CM | POA: Diagnosis present

## 2017-05-20 DIAGNOSIS — R1110 Vomiting, unspecified: Secondary | ICD-10-CM | POA: Diagnosis present

## 2017-05-20 DIAGNOSIS — O9989 Other specified diseases and conditions complicating pregnancy, childbirth and the puerperium: Secondary | ICD-10-CM | POA: Diagnosis not present

## 2017-05-20 DIAGNOSIS — O26891 Other specified pregnancy related conditions, first trimester: Secondary | ICD-10-CM | POA: Diagnosis present

## 2017-05-20 DIAGNOSIS — O219 Vomiting of pregnancy, unspecified: Secondary | ICD-10-CM | POA: Insufficient documentation

## 2017-05-20 LAB — URINALYSIS, ROUTINE W REFLEX MICROSCOPIC
BILIRUBIN URINE: NEGATIVE
GLUCOSE, UA: NEGATIVE mg/dL
HGB URINE DIPSTICK: NEGATIVE
Ketones, ur: 20 mg/dL — AB
Leukocytes, UA: NEGATIVE
NITRITE: NEGATIVE
Protein, ur: 100 mg/dL — AB
SPECIFIC GRAVITY, URINE: 1.023 (ref 1.005–1.030)
pH: 9 — ABNORMAL HIGH (ref 5.0–8.0)

## 2017-05-20 MED ORDER — METOCLOPRAMIDE HCL 5 MG/ML IJ SOLN
10.0000 mg | INTRAMUSCULAR | Status: AC
  Administered 2017-05-20: 10 mg via INTRAVENOUS
  Filled 2017-05-20: qty 2

## 2017-05-20 MED ORDER — DEXTROSE 5 % IN LACTATED RINGERS IV BOLUS
1000.0000 mL | Freq: Once | INTRAVENOUS | Status: AC
  Administered 2017-05-20: 1000 mL via INTRAVENOUS

## 2017-05-20 MED ORDER — ONDANSETRON HCL 40 MG/20ML IJ SOLN
8.0000 mg | Freq: Once | INTRAMUSCULAR | Status: AC
  Administered 2017-05-20: 8 mg via INTRAVENOUS
  Filled 2017-05-20: qty 4

## 2017-05-20 MED ORDER — ONDANSETRON 8 MG PO TBDP: 8.0000 mg | ORAL_TABLET | Freq: Three times a day (TID) | ORAL | 0 refills | Status: DC | PRN

## 2017-05-20 MED ORDER — M.V.I. ADULT IV INJ
Freq: Once | INTRAVENOUS | Status: AC
  Administered 2017-05-20: 10:00:00 via INTRAVENOUS
  Filled 2017-05-20: qty 1000

## 2017-05-20 MED ORDER — FAMOTIDINE IN NACL 20-0.9 MG/50ML-% IV SOLN
20.0000 mg | Freq: Once | INTRAVENOUS | Status: AC
  Administered 2017-05-20: 20 mg via INTRAVENOUS
  Filled 2017-05-20: qty 50

## 2017-05-20 NOTE — MAU Note (Signed)
Ongoing vomiting,hasn't kept anything down in 4 days. Bad indigestion.

## 2017-05-20 NOTE — MAU Provider Note (Signed)
History     CSN: [id number]  Arrival date and time: 05/20/17 9629   First Provider Initiated Contact with Patient 05/20/17 [phone]      Chief Complaint  Patient presents with  . Emesis  . Heartburn   Ms. Tara Gonzalez is a 21 y.o. G2P1001 at [redacted]w[redacted]d gestation presenting with complaints of ongoing vomiting -- "unable to keep anything down" for 4 days.  This is an on-going issue throughout this pregnancy.  She takes Phenergan, Reglan and Vitamin B6 with minimal to no relief.  She also complains of upper abdominal pain from "bad" indigestion.  She denies VB or fever.  Emesis   This is a recurrent problem. The current episode started more than 1 month ago. The problem occurs 5 to 10 times per day. The problem has been waxing and waning. The emesis has an appearance of bile. There has been no fever. Associated symptoms include abdominal pain (upper abdomen; indigestion). She has tried diet change, increased fluids and sleep (phenergan, reglan & vitamin B6) for the symptoms. The treatment provided no relief.    Past Medical History:  Diagnosis Date  . Anemia    during last pregnancy diagnosed   . Anxiety   . Bipolar disorder (HCC)   . Bipolar disorder (HCC)   . Depression    on going started diagnosed as child. not currently on medication   . Headache   . Pyelonephritis affecting pregnancy in first trimester     Past Surgical History:  Procedure Laterality Date  . TONSILLECTOMY      Family History  Problem Relation Age of Onset  . Hypertension Mother   . Hyperlipidemia Mother   . Diabetes Maternal Grandmother        type II  . Congenital Murmur Maternal Grandmother        with VSD  . Diabetes Maternal Grandfather        type II  . Asthma Brother   . Autism spectrum disorder Brother     Social History  Substance Use Topics  . Smoking status: Never Smoker  . Smokeless tobacco: Never Used  . Alcohol use No    Allergies: No Known Allergies  No prescriptions prior to  admission.    Review of Systems  Constitutional: Positive for appetite change and fatigue.  HENT: Negative.   Eyes: Negative.   Respiratory: Negative.   Cardiovascular: Negative.   Gastrointestinal: Positive for abdominal pain (upper abdomen; indigestion), nausea and vomiting.  Endocrine: Negative.   Genitourinary: Negative.   Musculoskeletal: Negative.   Skin: Negative.   Allergic/Immunologic: Negative.   Neurological: Negative.   Hematological: Negative.   Psychiatric/Behavioral: Negative.    Physical Exam   Patient Vitals for the past 24 hrs:  BP Temp Temp src Pulse Resp Weight  05/20/17 1145 120/75 - - - - -  05/20/17 0812 135/80 98.4 F (36.9 C) Oral (!) 101 16 -  05/20/17 0802 - - - - - 54.1 kg (119 lb 4 oz)  Weight 54.1 kg (119 lb 4 oz), last menstrual period 02/28/2017.  Physical Exam  Nursing note and vitals reviewed. Constitutional: She is oriented to person, place, and time. She appears well-developed and well-nourished.  HENT:  Head: Normocephalic.  Eyes: Pupils are equal, round, and reactive to light.  Neck: Normal range of motion.  Cardiovascular: Normal rate, regular rhythm and normal heart sounds.   Respiratory: Effort normal and breath sounds normal.  GI: Soft. Bowel sounds are decreased. There is tenderness in the epigastric  area.  Genitourinary:  Genitourinary Comments: deferred  Musculoskeletal: Normal range of motion.  Neurological: She is alert and oriented to person, place, and time.  Skin: Skin is warm and dry.  Psychiatric: She has a normal mood and affect. Her behavior is normal. Judgment and thought content normal.    MAU Course  Procedures  MDM [facility] IVFs: D5LR 1000 ml @ bolus rate Zofran 8 mg IVPB Pepcid 20 mg IVPB MVI in LR 1000 ml @ 500 ml/hr Reglan 10 mg IVP -- vomiting improved  Results for orders placed or performed during the hospital encounter of 05/20/17 (from the past 24 hour(s))  Urinalysis, Routine w reflex microscopic      Status: Abnormal   Collection Time: 05/20/17  8:00 AM  Result Value Ref Range   Color, Urine AMBER (A) YELLOW   APPearance HAZY (A) CLEAR   Specific Gravity, Urine 1.023 1.005 - 1.030   pH 9.0 (H) 5.0 - 8.0   Glucose, UA NEGATIVE NEGATIVE mg/dL   Hgb urine dipstick NEGATIVE NEGATIVE   Bilirubin Urine NEGATIVE NEGATIVE   Ketones, ur 20 (A) NEGATIVE mg/dL   Protein, ur 161 (A) NEGATIVE mg/dL   Nitrite NEGATIVE NEGATIVE   Leukocytes, UA NEGATIVE NEGATIVE   RBC / HPF 0-5 0 - 5 RBC/hpf   WBC, UA 0-5 0 - 5 WBC/hpf   Bacteria, UA RARE (A) NONE SEEN   Squamous Epithelial / LPF 0-5 (A) NONE SEEN   Mucus PRESENT     Assessment and Plan  Hyperemesis - Rx for Zofran 8 mg ODT every 8 hours prn N/V  - Advised to take Zofran only when Phenergan and Reglan are not working - Keep scheduled appt with [facility] on 9/26  Discharge home Patient verbalized an understanding of the plan of care and agrees.   Raelyn Mora, MSN, CNM 05/20/2017, 8:30 AM

## 2017-05-31 ENCOUNTER — Ambulatory Visit (INDEPENDENT_AMBULATORY_CARE_PROVIDER_SITE_OTHER): Payer: Medicaid Other | Admitting: Student in an Organized Health Care Education/Training Program

## 2017-05-31 VITALS — BP 112/66 | HR 113 | Temp 98.5°F | Wt 125.4 lb

## 2017-05-31 DIAGNOSIS — Z3482 Encounter for supervision of other normal pregnancy, second trimester: Secondary | ICD-10-CM

## 2017-05-31 DIAGNOSIS — Z3480 Encounter for supervision of other normal pregnancy, unspecified trimester: Secondary | ICD-10-CM | POA: Diagnosis present

## 2017-05-31 DIAGNOSIS — R8271 Bacteriuria: Secondary | ICD-10-CM | POA: Diagnosis not present

## 2017-05-31 LAB — POCT URINALYSIS DIP (MANUAL ENTRY)
BILIRUBIN UA: NEGATIVE
Blood, UA: NEGATIVE
GLUCOSE UA: NEGATIVE mg/dL
Ketones, POC UA: NEGATIVE mg/dL
NITRITE UA: NEGATIVE
Protein Ur, POC: NEGATIVE mg/dL
Spec Grav, UA: >=1.03 — AB (ref 1.010–1.025)
Urobilinogen, UA: 0.2 E.U./dL
pH, UA: 6 (ref 5.0–8.0)

## 2017-05-31 NOTE — Progress Notes (Signed)
Tara Gonzalez is a 21 y.o. G2P1001 at [redacted]w[redacted]d here for routine follow up.  Patient had significant nausea and vomiting in the first trimester. This week she reports that nausea and vomiting is significantly improved. Likely because she is in the second trimester.  She reports no complaints. See flow sheet for details.  FHT: 164 Fundal height: 13 cm Pain/pressure: none LOF none N/V none Contractions None  She is now gaining weight. Discussed importance of staying hydrated. Patient is not interested in genetic screening. This pregnancy patient did have asymptomatic positive urine culture, test of cure was collected at today's visit. Anatomy ultrasound scheduled.  Bleeding and pain precautions reviewed. Follow up 4 weeks.  Howard Pouch, MD PGY-2 [facility]

## 2017-05-31 NOTE — Patient Instructions (Addendum)
It was a pleasure seeing you today in our clinic. Today we discussed your pregnancy. Here is the treatment plan we have discussed and agreed upon together: Your baby's heart rate was 164 today! You are measuring right at 13 weeks, and you are 13 weeks one day today. We scheduled your anatomy scan today. We tested your urine today to make sure the bacteria has resolved.   Our clinic's number is [phone]. Please call with questions or concerns about what we discussed today.  Be well, Dr. Mosetta Putt

## 2017-06-01 ENCOUNTER — Other Ambulatory Visit: Payer: Self-pay | Admitting: Student in an Organized Health Care Education/Training Program

## 2017-06-01 DIAGNOSIS — R8271 Bacteriuria: Secondary | ICD-10-CM

## 2017-06-01 NOTE — Telephone Encounter (Signed)
Called and spoke with Melah's mom to let her know we need Alysha to come in to give another urine sample. We meant to get a urine culture at yesterday's visit but today we only got a UA. Urine culture order placed in epic, she'll come in as a lab visit.  Howard Pouch, MD PGY-2 [facility]

## 2017-06-18 ENCOUNTER — Encounter (HOSPITAL_COMMUNITY): Payer: Self-pay

## 2017-06-18 ENCOUNTER — Inpatient Hospital Stay (HOSPITAL_COMMUNITY)
Admission: AD | Admit: 2017-06-18 | Discharge: 2017-06-19 | Disposition: A | Discharge status: Home / Self Care | Payer: Medicaid Other | Source: Ambulatory Visit | Attending: Obstetrics & Gynecology | Admitting: Obstetrics & Gynecology

## 2017-06-18 DIAGNOSIS — E876 Hypokalemia: Secondary | ICD-10-CM

## 2017-06-18 DIAGNOSIS — O210 Mild hyperemesis gravidarum: Secondary | ICD-10-CM | POA: Diagnosis not present

## 2017-06-18 DIAGNOSIS — O211 Hyperemesis gravidarum with metabolic disturbance: Secondary | ICD-10-CM | POA: Insufficient documentation

## 2017-06-18 DIAGNOSIS — Z3A15 15 weeks gestation of pregnancy: Secondary | ICD-10-CM | POA: Insufficient documentation

## 2017-06-18 LAB — URINALYSIS, ROUTINE W REFLEX MICROSCOPIC
Bacteria, UA: NONE SEEN
Bilirubin Urine: NEGATIVE
Glucose, UA: NEGATIVE mg/dL
Hgb urine dipstick: NEGATIVE
Ketones, ur: 80 mg/dL — AB
Leukocytes, UA: NEGATIVE
NITRITE: NEGATIVE
PROTEIN: 30 mg/dL — AB
SPECIFIC GRAVITY, URINE: 1.027 (ref 1.005–1.030)
pH: 5 (ref 5.0–8.0)

## 2017-06-18 MED ORDER — LACTATED RINGERS IV SOLN: INTRAVENOUS | Status: DC

## 2017-06-18 MED ORDER — PROMETHAZINE HCL 25 MG/ML IJ SOLN: 12.5000 mg | Freq: Once | INTRAMUSCULAR | Status: DC

## 2017-06-18 MED ORDER — FAMOTIDINE IN NACL 20-0.9 MG/50ML-% IV SOLN
20.0000 mg | Freq: Once | INTRAVENOUS | Status: AC
  Administered 2017-06-19: 20 mg via INTRAVENOUS
  Filled 2017-06-18: qty 50

## 2017-06-18 MED ORDER — LACTATED RINGERS IV BOLUS (SEPSIS)
500.0000 mL | Freq: Once | INTRAVENOUS | Status: AC
  Administered 2017-06-18: 1000 mL via INTRAVENOUS

## 2017-06-18 MED ORDER — PROMETHAZINE HCL 25 MG/ML IJ SOLN
12.5000 mg | Freq: Once | INTRAMUSCULAR | Status: AC
  Administered 2017-06-19: 12.5 mg via INTRAVENOUS
  Filled 2017-06-18: qty 1

## 2017-06-18 NOTE — MAU Note (Signed)
Caught a cold 3-4 days ago, since then has had increased n/v, body aches and chest congestion.  No diarrhea.  No VB or discharge.  Does not remember whether she has taken any cold medications.  States her son recently had a cold also. Has not taken her temperature in past few days so not sure if she's been febrile.

## 2017-06-19 DIAGNOSIS — O210 Mild hyperemesis gravidarum: Secondary | ICD-10-CM

## 2017-06-19 LAB — CBC
HCT: 38.1 % (ref 36.0–46.0)
HEMOGLOBIN: 13.4 g/dL (ref 12.0–15.0)
MCH: 30.5 pg (ref 26.0–34.0)
MCHC: 35.2 g/dL (ref 30.0–36.0)
MCV: 86.8 fL (ref 78.0–100.0)
Platelets: 299 10*3/uL (ref 150–400)
RBC: 4.39 MIL/uL (ref 3.87–5.11)
RDW: 13.3 % (ref 11.5–15.5)
WBC: 13.8 10*3/uL — AB (ref 4.0–10.5)

## 2017-06-19 LAB — COMPREHENSIVE METABOLIC PANEL
ALBUMIN: 3.8 g/dL (ref 3.5–5.0)
ALK PHOS: 64 U/L (ref 38–126)
ALT: 52 U/L (ref 14–54)
AST: 34 U/L (ref 15–41)
Anion gap: 11 (ref 5–15)
BUN: 14 mg/dL (ref 6–20)
CALCIUM: 9.7 mg/dL (ref 8.9–10.3)
CO2: 28 mmol/L (ref 22–32)
CREATININE: 0.53 mg/dL (ref 0.44–1.00)
Chloride: 97 mmol/L — ABNORMAL LOW (ref 101–111)
GFR calc Af Amer: >60 mL/min (ref >60)
GFR calc non Af Amer: >60 mL/min (ref >60)
Glucose, Bld: 100 mg/dL — ABNORMAL HIGH (ref 65–99)
Potassium: 3.3 mmol/L — ABNORMAL LOW (ref 3.5–5.1)
SODIUM: 136 mmol/L (ref 135–145)
TOTAL PROTEIN: 7.3 g/dL (ref 6.5–8.1)
Total Bilirubin: 0.9 mg/dL (ref 0.3–1.2)

## 2017-06-19 MED ORDER — SODIUM CHLORIDE 0.9 % IV SOLN
8.0000 mg | Freq: Once | INTRAVENOUS | Status: AC
  Administered 2017-06-19: 8 mg via INTRAVENOUS
  Filled 2017-06-19: qty 4

## 2017-06-19 MED ORDER — POTASSIUM CHLORIDE CRYS ER 20 MEQ PO TBCR: 20.0000 meq | EXTENDED_RELEASE_TABLET | Freq: Two times a day (BID) | ORAL | 0 refills | Status: DC

## 2017-06-19 MED ORDER — ONDANSETRON 8 MG PO TBDP: 8.0000 mg | ORAL_TABLET | Freq: Three times a day (TID) | ORAL | 0 refills | Status: DC | PRN

## 2017-06-19 MED ORDER — LACTATED RINGERS IV SOLN: INTRAVENOUS | Status: DC

## 2017-06-19 NOTE — MAU Note (Signed)
NO VOMITING WHILE IN MAU 

## 2017-06-19 NOTE — MAU Note (Signed)
PO ICE CHIPS  

## 2017-06-19 NOTE — MAU Note (Signed)
PO ICE CHIPS AGAIN

## 2017-06-19 NOTE — MAU Note (Signed)
VOMITED 75CC CLEAR LIQUID

## 2017-06-19 NOTE — MAU Provider Note (Signed)
Patient Tara Gonzalez is a 21 y.o. G2P1001 at [redacted]w[redacted]d here with complaints of N/V for the past three days.  She also has acid reflux and back pain from vomiting.  She denies bleeding, leaking of fluid.  She says that she had not had problems keeping any food down until she got sick with a cold 4 days ago and stopped taking her medication.    History     CSN: [id number]  Arrival date and time: 06/18/17 2227   None     Chief Complaint  Patient presents with  . Emesis  . Nausea   Emesis   This is a new problem. The current episode started in the past 7 days. The problem occurs more than 10 times per day. The problem has been unchanged. The emesis has an appearance of stomach contents and bile. There has been no fever. Associated symptoms include abdominal pain, chills and coughing.   Patient has an inconsistent explanation of how she has been taking her medication. She states that she was taking her anti-nausea medication daily, but that it wasn't working so she stopped taking it daily and started taking it PRN.   OB History    Gravida Para Term Preterm AB Living   SAB TAB Ectopic Multiple Live Births         0 1      Past Medical History:  Diagnosis Date  . Anemia    during last pregnancy diagnosed   . Anxiety   . Bipolar disorder (HCC)   . Bipolar disorder (HCC)   . Depression    on going started diagnosed as child. not currently on medication   . Headache   . Pyelonephritis affecting pregnancy in first trimester     Past Surgical History:  Procedure Laterality Date  . TONSILLECTOMY      Family History  Problem Relation Age of Onset  . Hypertension Mother   . Hyperlipidemia Mother   . Diabetes Maternal Grandmother        type II  . Congenital Murmur Maternal Grandmother        with VSD  . Diabetes Maternal Grandfather        type II  . Asthma Brother   . Autism spectrum disorder Brother     Social History  Substance Use Topics  . Smoking  status: Never Smoker  . Smokeless tobacco: Never Used  . Alcohol use No    Allergies: No Known Allergies  Prescriptions Prior to Admission  Medication Sig Dispense Refill Last Dose  . famotidine (PEPCID) 20 MG tablet Take 1 tablet (20 mg total) by mouth 2 (two) times daily. 30 tablet 2 05/20/2017 at Unknown time  . prenatal vitamin w/FE, FA (NATACHEW) 29-1 MG CHEW chewable tablet Chew 1 tablet by mouth daily at 12 noon. (Patient not taking: Reported on 05/04/2017) 30 tablet 6 Not Taking at Unknown time    Review of Systems  Constitutional: Positive for chills.  Respiratory: Positive for cough.   Gastrointestinal: Positive for abdominal pain and vomiting.   Physical Exam   Blood pressure 115/80, pulse (!) 103, temperature 97.8 F (36.6 C), temperature source Axillary, resp. rate 19, height  (1.549 m), weight 121 lb (54.9 kg), last menstrual period 02/28/2017, SpO2 100 %, unknown if currently breastfeeding.  Physical Exam  Constitutional: She is oriented to person, place, and time. She appears well-developed.  HENT:  Head: Normocephalic.  Neck: Normal range  of motion.  Respiratory: Effort normal.  GI: Soft.  Musculoskeletal: Normal range of motion.  Neurological: She is alert and oriented to person, place, and time. She has normal reflexes.  Skin: Skin is warm.  Psychiatric: She has a normal mood and affect.    MAU Course  Procedures  MDM -phenergan 12.5 in LR/ Patient did not feel better so then given 8 mg of zofran IV.  -UA: positive for ketones -pepcid IV -FHR is 165 by doppler  Patient feeling better now; has not had any more vomiting since Zofran and is keeping down sprite and water.  Assessment and Plan   1. Hyperemesis affecting pregnancy, antepartum   2. Hypokalemia    2. Patient stable for discharge with RX for Zofran ODT and 3 days of potassium chloride. Recommended to patient that she follow eating plan for hyperemesis and take her Zofran q 8 hours,  regardless of symptoms or not.  3. Encouraged patient to stay hydrated; she should return to the MAU if she cannot keep down liquids.  4. Return precautions given.  Charlesetta Garibaldi Kooistra 06/19/2017, 12:27 AM

## 2017-06-19 NOTE — Discharge Instructions (Signed)
Eating Plan for Hyperemesis Gravidarum °Hyperemesis gravidarum is a severe form of morning sickness. Because this condition causes severe nausea and vomiting, it can lead to dehydration, malnutrition, and weight loss. One way to lessen the symptoms of nausea and vomiting is to follow the eating plan for hyperemesis gravidarum. It is often used along with prescribed medicines to control your symptoms. °What can I do to relieve my symptoms? °Listen to your body. Everyone is different and has different preferences. Find what works best for you. Take any of the following actions that are helpful to you: °· Eat and drink slowly. °· Eat 5-6 small meals daily instead of 3 large meals. °· Eat crackers before you get out of bed in the morning. °· Try having a snack in the middle of the night. °· Starchy foods are usually tolerated well. Examples include cereal, toast, bread, potatoes, pasta, rice, and pretzels. °· Ginger may help with nausea. Add ¼ tsp ground ginger to hot tea or choose ginger tea. °· Try drinking 100% fruit juice or an electrolyte drink. An electrolyte drink contains sodium, potassium, and chloride. °· Continue to take your prenatal vitamins as told by your health care provider. If you are having trouble taking your prenatal vitamins, talk with your health care provider about different options. °· Include at least 1 serving of protein with your meals and snacks. Protein options include meats or poultry, beans, nuts, eggs, and yogurt. Try eating a protein-rich snack before bed. Examples of these snacks include cheese and crackers or half of a peanut butter or turkey sandwich. °· Consider eliminating foods that trigger your symptoms. These may include spicy foods, coffee, high-fat foods, very sweet foods, and acidic foods. °· Try meals that have more protein combined with bland, salty, lower-fat, and dry foods, such as nuts, seeds, pretzels, crackers, and cereal. °· Talk with your healthcare provider about  starting a supplement of vitamin B6. °· Have fluids that are cold, clear, and carbonated or sour. Examples include lemonade, ginger ale, lemon-lime soda, ice water, and sparkling water. °· Try lemon or mint tea. °· Try brushing your teeth or using a mouth rinse after meals. ° °What should I avoid to reduce my symptoms? °Avoiding some of the following things may help reduce your symptoms. °· Foods with strong smells. Try eating meals in well-ventilated areas that are free of odors. °· Drinking water or other beverages with meals. Try not to drink anything during the 30 minutes before and after your meals. °· Drinking more than 1 cup of fluid at a time. Sometimes using a straw helps. °· Fried or high-fat foods, such as butter and cream sauces. °· Spicy foods. °· Skipping meals as best as you can. Nausea can be more intense on an empty stomach. If you cannot tolerate food at that time, do not force it. Try sucking on ice chips or other frozen items, and make up for missed calories later. °· Lying down within 2 hours after eating. °· Environmental triggers. These may include smoky rooms, closed spaces, rooms with strong smells, warm or humid places, overly loud and noisy rooms, and rooms with motion or flickering lights. °· Quick and sudden changes in your movement. ° °This information is not intended to replace advice given to you by your health care provider. Make sure you discuss any questions you have with your health care provider. °Document Released: 06/19/2007 Document Revised: 04/20/2016 Document Reviewed: 03/22/2016 °Elsevier Interactive Patient Education © 2018 Elsevier Inc[employer] °Morning Sickness °Morning sickness   is when you feel sick to your stomach (nauseous) during pregnancy. You may feel sick to your stomach and throw up (vomit). You may feel sick in the morning, but you can feel this way any time of day. Some women feel very sick to their stomach and cannot stop throwing up (hyperemesis gravidarum). °Follow  these instructions at home: °· Only take medicines as told by your doctor. °· Take multivitamins as told by your doctor. Taking multivitamins before getting pregnant can stop or lessen the harshness of morning sickness. °· Eat dry toast or unsalted crackers before getting out of bed. °· Eat 5 to 6 small meals a day. °· Eat dry and bland foods like rice and baked potatoes. °· Do not drink liquids with meals. Drink between meals. °· Do not eat greasy, fatty, or spicy foods. °· Have someone cook for you if the smell of food causes you to feel sick or throw up. °· If you feel sick to your stomach after taking prenatal vitamins, take them at night or with a snack. °· Eat protein when you need a snack (nuts, yogurt, cheese). °· Eat unsweetened gelatins for dessert. °· Wear a bracelet used for sea sickness (acupressure wristband). °· Go to a doctor that puts thin needles into certain body points (acupuncture) to improve how you feel. °· Do not smoke. °· Use a humidifier to keep the air in your house free of odors. °· Get lots of fresh air. °Contact a doctor if: °· You need medicine to feel better. °· You feel dizzy or lightheaded. °· You are losing weight. °Get help right away if: °· You feel very sick to your stomach and cannot stop throwing up. °· You pass out (faint). °This information is not intended to replace advice given to you by your health care provider. Make sure you discuss any questions you have with your health care provider. °Document Released: 09/29/2004 Document Revised: 01/28/2016 Document Reviewed: 02/06/2013 °Elsevier Interactive Patient Education © 2017 Elsevier Inc. ° °

## 2017-06-27 ENCOUNTER — Encounter (HOSPITAL_COMMUNITY): Payer: Self-pay | Admitting: Family Medicine

## 2017-07-04 ENCOUNTER — Ambulatory Visit (HOSPITAL_COMMUNITY)
Admission: RE | Admit: 2017-07-04 | Discharge: 2017-07-04 | Disposition: A | Discharge status: Home / Self Care | Payer: Medicaid Other | Source: Ambulatory Visit | Attending: Family Medicine | Admitting: Family Medicine

## 2017-07-04 ENCOUNTER — Other Ambulatory Visit: Payer: Self-pay | Admitting: Student in an Organized Health Care Education/Training Program

## 2017-07-04 DIAGNOSIS — Z3482 Encounter for supervision of other normal pregnancy, second trimester: Secondary | ICD-10-CM | POA: Diagnosis present

## 2017-07-04 DIAGNOSIS — Z3689 Encounter for other specified antenatal screening: Secondary | ICD-10-CM

## 2017-07-04 DIAGNOSIS — Z3480 Encounter for supervision of other normal pregnancy, unspecified trimester: Secondary | ICD-10-CM

## 2017-07-17 ENCOUNTER — Ambulatory Visit (INDEPENDENT_AMBULATORY_CARE_PROVIDER_SITE_OTHER): Payer: Medicaid Other | Admitting: Student in an Organized Health Care Education/Training Program

## 2017-07-17 ENCOUNTER — Encounter: Payer: Self-pay | Admitting: Student in an Organized Health Care Education/Training Program

## 2017-07-17 VITALS — BP 100/70 | HR 84 | Temp 98.4°F | Wt 144.2 lb

## 2017-07-17 DIAGNOSIS — Z3482 Encounter for supervision of other normal pregnancy, second trimester: Secondary | ICD-10-CM

## 2017-07-17 NOTE — Progress Notes (Signed)
Dagmar Haityana Ozimek is a 21 y.o. G2P1001 at 1950w6d here for routine follow up.  She reports fetal movement, no pain or pressure, no contractions..  See flow sheet for details.  A/P: Pregnancy at 6350w6d.  Doing well.   Pregnancy issues include  1. asymptomatic bacturia which was treated. Will get TOC today. 2. Hyperemesis gravida which has resolved. 3.  Anatomy scan reviewed, problems are not noted. However because it was a difficult study, it was recommended that we follow up with repeat test in 6-8 weeks. Ordered today.  Planning for circumcision Considering nexplanon Preterm labor precautions reviewed. Follow up 4 weeks. Plan to [facility].

## 2017-07-17 NOTE — Patient Instructions (Signed)
It was a pleasure seeing you today in our clinic. Today we discussed your ultrasound and pregnancy. Here is the treatment plan we have discussed and agreed upon together:  Please schedule a follow up visit to be seen in 4 weeks.  Our clinic's number is [phone]. Please call with questions or concerns about what we discussed today.  Be well, Dr. Mosetta PuttFeng

## 2017-07-19 ENCOUNTER — Other Ambulatory Visit: Payer: Self-pay | Admitting: Student in an Organized Health Care Education/Training Program

## 2017-07-19 DIAGNOSIS — Z3492 Encounter for supervision of normal pregnancy, unspecified, second trimester: Secondary | ICD-10-CM

## 2017-07-19 LAB — CULTURE, OB URINE

## 2017-07-19 LAB — URINE CULTURE, OB REFLEX

## 2017-07-19 NOTE — Progress Notes (Signed)
Repeat OB urine culture as lab visit, 25-50k lactobacilli on last one.

## 2017-07-20 ENCOUNTER — Other Ambulatory Visit: Payer: Medicaid Other

## 2017-07-20 ENCOUNTER — Telehealth: Payer: Self-pay | Admitting: *Deleted

## 2017-07-20 NOTE — Telephone Encounter (Signed)
Pts mom informed (accompanies to every visit).  She will bring in this afternoon. Fleeger, Maryjo RochesterJessica Dawn, CMA

## 2017-07-20 NOTE — Telephone Encounter (Signed)
-----   Message from Howard PouchLauren Feng, MD sent at 07/19/2017 12:18 PM EST ----- Regarding: OB urine Please call patient and ask her to come for lab visit in to give urine to repeat her test. She had bacteria in her urine and we need to check to see if she needs antibiotics, but we will start by repeating urine test.  Thank you  ----- Message ----- From: Interface, [facility] Lab Results In Sent: 07/19/2017   8:19 AM To: Howard PouchLauren Feng, MD

## 2017-07-21 ENCOUNTER — Ambulatory Visit (HOSPITAL_COMMUNITY)
Admission: RE | Admit: 2017-07-21 | Discharge: 2017-07-21 | Disposition: A | Discharge status: Home / Self Care | Payer: Medicaid Other | Source: Ambulatory Visit | Attending: Family Medicine | Admitting: Family Medicine

## 2017-07-21 DIAGNOSIS — Z3482 Encounter for supervision of other normal pregnancy, second trimester: Secondary | ICD-10-CM

## 2017-08-15 ENCOUNTER — Ambulatory Visit (HOSPITAL_COMMUNITY)
Admission: RE | Admit: 2017-08-15 | Discharge: 2017-08-15 | Disposition: A | Discharge status: Home / Self Care | Payer: Medicaid Other | Source: Ambulatory Visit | Attending: Family Medicine | Admitting: Family Medicine

## 2017-08-15 ENCOUNTER — Other Ambulatory Visit: Payer: Self-pay | Admitting: Student in an Organized Health Care Education/Training Program

## 2017-08-15 DIAGNOSIS — Z0489 Encounter for examination and observation for other specified reasons: Secondary | ICD-10-CM

## 2017-08-15 DIAGNOSIS — Z3A24 24 weeks gestation of pregnancy: Secondary | ICD-10-CM

## 2017-08-15 DIAGNOSIS — Z362 Encounter for other antenatal screening follow-up: Secondary | ICD-10-CM | POA: Insufficient documentation

## 2017-08-15 DIAGNOSIS — IMO0002 Reserved for concepts with insufficient information to code with codable children: Secondary | ICD-10-CM

## 2017-08-17 ENCOUNTER — Other Ambulatory Visit: Payer: Self-pay | Admitting: Student in an Organized Health Care Education/Training Program

## 2017-08-17 DIAGNOSIS — Z362 Encounter for other antenatal screening follow-up: Secondary | ICD-10-CM

## 2017-08-17 DIAGNOSIS — Z3A24 24 weeks gestation of pregnancy: Secondary | ICD-10-CM

## 2017-08-25 ENCOUNTER — Ambulatory Visit (INDEPENDENT_AMBULATORY_CARE_PROVIDER_SITE_OTHER): Payer: Medicaid Other | Admitting: Student in an Organized Health Care Education/Training Program

## 2017-08-25 ENCOUNTER — Other Ambulatory Visit: Payer: Self-pay

## 2017-08-25 ENCOUNTER — Encounter: Payer: Self-pay | Admitting: Student in an Organized Health Care Education/Training Program

## 2017-08-25 VITALS — BP 100/50 | HR 93 | Temp 98.4°F | Ht 61.0 in | Wt 155.4 lb

## 2017-08-25 DIAGNOSIS — Z331 Pregnant state, incidental: Secondary | ICD-10-CM | POA: Diagnosis not present

## 2017-08-25 DIAGNOSIS — Z3482 Encounter for supervision of other normal pregnancy, second trimester: Secondary | ICD-10-CM | POA: Diagnosis not present

## 2017-08-25 DIAGNOSIS — Z131 Encounter for screening for diabetes mellitus: Secondary | ICD-10-CM

## 2017-08-25 LAB — POCT 1 HR PRENATAL GLUCOSE: GLUCOSE 1 HR PRENATAL, POC: 144 mg/dL

## 2017-08-25 NOTE — Patient Instructions (Addendum)
It was a pleasure seeing you today in our clinic. Today we discussed your prenatal care. Here is the treatment plan we have discussed and agreed upon together:  Please schedule a visit [facility] for 4 weeks from now.  Our clinic's number is [phone]. Please call with questions or concerns about what we discussed today.  Be well, Dr. Mosetta PuttFeng   Second Trimester of Pregnancy The second trimester is from week 13 through week 28, month 4 through 6. This is often the time in pregnancy that you feel your best. Often times, morning sickness has lessened or quit. You may have more energy, and you may get hungry more often. Your unborn baby (fetus) is growing rapidly. At the end of the sixth month, he or she is about 9 inches long and weighs about 1 pounds. You will likely feel the baby move (quickening) between 18 and 20 weeks of pregnancy. Follow these instructions at home:  Avoid all smoking, herbs, and alcohol. Avoid drugs not approved by your doctor.  Do not use any tobacco products, including cigarettes, chewing tobacco, and electronic cigarettes. If you need help quitting, ask your doctor. You may get counseling or other support to help you quit.  Only take medicine as told by your doctor. Some medicines are safe and some are not during pregnancy.  Exercise only as told by your doctor. Stop exercising if you start having cramps.  Eat regular, healthy meals.  Wear a good support bra if your breasts are tender.  Do not use hot tubs, steam rooms, or saunas.  Wear your seat belt when driving.  Avoid raw meat, uncooked cheese, and liter boxes and soil used by cats.  Take your prenatal vitamins.  Take 1500-2000 milligrams of calcium daily starting at the 20th week of pregnancy until you deliver your baby.  Try taking medicine that helps you poop (stool softener) as needed, and if your doctor approves. Eat more fiber by eating fresh fruit, vegetables, and whole grains. Drink  enough fluids to keep your pee (urine) clear or pale yellow.  Take warm water baths (sitz baths) to soothe pain or discomfort caused by hemorrhoids. Use hemorrhoid cream if your doctor approves.  If you have puffy, bulging veins (varicose veins), wear support hose. Raise (elevate) your feet for 15 minutes, 3-4 times a day. Limit salt in your diet.  Avoid heavy lifting, wear low heals, and sit up straight.  Rest with your legs raised if you have leg cramps or low back pain.  Visit your dentist if you have not gone during your pregnancy. Use a soft toothbrush to brush your teeth. Be gentle when you floss.  You can have sex (intercourse) unless your doctor tells you not to.  Go to your doctor visits. Get help if:  You feel dizzy.  You have mild cramps or pressure in your lower belly (abdomen).  You have a nagging pain in your belly area.  You continue to feel sick to your stomach (nauseous), throw up (vomit), or have watery poop (diarrhea).  You have bad smelling fluid coming from your vagina.  You have pain with peeing (urination). Get help right away if:  You have a fever.  You are leaking fluid from your vagina.  You have spotting or bleeding from your vagina.  You have severe belly cramping or pain.  You lose or gain weight rapidly.  You have trouble catching your breath and have chest pain.  You notice sudden or extreme  puffiness (swelling) of your face, hands, ankles, feet, or legs.  You have not felt the baby move in over an hour.  You have severe headaches that do not go away with medicine.  You have vision changes. This information is not intended to replace advice given to you by your health care provider. Make sure you discuss any questions you have with your health care provider. Document Released: 11/16/2009 Document Revised: 01/28/2016 Document Reviewed: 10/23/2012 Elsevier Interactive Patient Education  2017 Elsevier Inc.  

## 2017-08-25 NOTE — Progress Notes (Signed)
Tara Gonzalez is a 21 y.o. G2P1001 at 331w3d here for routine follow up.  She reports +fetal movement, no loss of fluid, no  . See flow sheet for details.  A/P: Pregnancy at 671w3d.  Doing well.    Preterm labor and fetal movement precautions reviewed. Follow up in 4 weeks, [facility].  1. asymptomatic bacturia which was treated. [facility] 25[facility] Asked patient to follow up for repeat test, however this is first time she's been back in the clinic. - repeat OB urine - if continues to have bacteria, will plan to reach out to OB   2. Hyperemesis gravida has resolved.  3.  Anatomy scan reviewed, problems are not noted. Repeat scan was completed and reviewed, to be scanned into chart.  4. Failed 1 hr GTT. 3 Hr GTT scheduled.  Howard PouchLauren Monti Jilek, MD PGY-2 [facility]

## 2017-08-27 ENCOUNTER — Encounter: Payer: Self-pay | Admitting: Student in an Organized Health Care Education/Training Program

## 2017-09-01 ENCOUNTER — Other Ambulatory Visit: Payer: Medicaid Other

## 2017-09-05 NOTE — L&D Delivery Note (Addendum)
Operative Delivery Note At 11:23 PM a viable female was delivered via Vaginal, Spontaneous.  Presentation: vertex; Position: Right,, Occiput,, Anterior.  Delivery of the head: 12/08/2017 11:22 PM First maneuver: 12/08/2017 11:22 PM, McRoberts Second maneuver: 12/08/2017 11:22 PM, Suprapubic Pressure Third maneuver: 12/08/2017 11:22 PM, attempt to deliver posterior arm unsuccessful but posterior shoulder rotated and allowed for anterior shoulder to deliver  Verbal consent: obtained from patient.  APGAR: 5, 9; weight pending.   Placenta status: intact   Cord: 3V, cord pH: obtained with venous blood gas: 7.371  Anesthesia:   Episiotomy: None Lacerations: 1st degree;Labial Suture Repair: 3.0 vicryl rapide x1 Est. Blood Loss (mL): 100  Mom to postpartum.  Baby to Couplet care / Skin to Skin.  Tara Gonzalez 12/09/2017, 12:04 AM  Midwife attestation: I was gloved and present for delivery in its entirety and I agree with the above resident's note.   Tara Gonzalez is a 22 y.o. female [phone] with IUP at 4151w3d admitted for IOL for postdates.  She progressed with augmentation to complete and pushed less than 30 minutes to deliver.  Shoulder dystocia lasting 1 minute with McRoberts and suprapubic pressure unsuccessful. Initially Dr Tara Gonzalez was delivering provider but with initial maneuvers not successful, I attempted to deliver posterior arm which was unsuccessful but this rotated the posterior shoulder and the anterior shoulder delivered. Cord clamping done prior to 1 minute due to poor transition after shoulder dystocia. Infant quickly responded to resuscitation and remained with mother.  APGAR 8 at 5 minutes.  Placenta intact and spontaneous, bleeding minimal.  Intact perineum.  Mom and baby stable prior to transfer to postpartum. She plans on breastfeeding and formula feeding. She requests Nexplanon for birth control.  Tara Gonzalez, CNM 3:43 AM

## 2017-09-06 ENCOUNTER — Other Ambulatory Visit: Payer: Medicaid Other

## 2017-09-19 ENCOUNTER — Other Ambulatory Visit: Payer: Self-pay | Admitting: Student in an Organized Health Care Education/Training Program

## 2017-09-19 ENCOUNTER — Other Ambulatory Visit: Payer: Medicaid Other

## 2017-09-19 DIAGNOSIS — Z3482 Encounter for supervision of other normal pregnancy, second trimester: Secondary | ICD-10-CM

## 2017-09-19 MED ORDER — ONDANSETRON HCL 4 MG PO TABS: 4.0000 mg | ORAL_TABLET | Freq: Once | ORAL | 0 refills | Status: AC

## 2017-09-19 NOTE — Progress Notes (Signed)
Ordered zofran x2 for patient to bring to [facility] because she vomited when taking the syrup today. She can take one 30 minutes prior to her test and one when she gets here for her test.

## 2017-09-19 NOTE — Progress Notes (Signed)
Patient became sick before finishing the 100 gram of glucose for her 3 HR GTT. Test cancelled for today. Will ask PCP to send in zofran to patients pharmacy and try again another day. Hervey Wedig, Rodena Medinobert Lee

## 2017-09-25 ENCOUNTER — Other Ambulatory Visit: Payer: Self-pay

## 2017-09-25 ENCOUNTER — Ambulatory Visit (INDEPENDENT_AMBULATORY_CARE_PROVIDER_SITE_OTHER): Payer: Medicaid Other | Admitting: Student in an Organized Health Care Education/Training Program

## 2017-09-25 ENCOUNTER — Encounter: Payer: Self-pay | Admitting: Student in an Organized Health Care Education/Training Program

## 2017-09-25 VITALS — BP 100/60 | HR 94 | Temp 98.1°F | Wt 163.0 lb

## 2017-09-25 DIAGNOSIS — Z3A29 29 weeks gestation of pregnancy: Secondary | ICD-10-CM

## 2017-09-25 DIAGNOSIS — Z23 Encounter for immunization: Secondary | ICD-10-CM

## 2017-09-25 DIAGNOSIS — O09213 Supervision of pregnancy with history of pre-term labor, third trimester: Secondary | ICD-10-CM

## 2017-09-25 NOTE — Progress Notes (Signed)
Tara Gonzalez is a 22 y.o. G2P1001 at 5862w6d here for routine follow up.  She reports +FM, no abnormal discharge or LOF, no bleeding. No contractions. See flow sheet for details.  A/P: Pregnancy at 3462w6d.  Doing well.   Pregnancy issues include Asymptomatic bacturia, Hyperemesis gravida, Failed 1hr GTT.  1.asymptomatic bacturia which was treated. [facility] 25[facility] Asked patient to follow up for repeat test, however she did not come back as lab visit to give urine and she left without giving urine at the last visit. - repeat OB urine - if continues to have bacteria, will plan to reach out to OB   2. Hyperemesis gravida has resolved.  3.Anatomy scan reviewed, problemsare notnoted. Repeat scan was completed and reviewed, to be scanned into chart.  4. Failed 1 hr GTT.  - 3 Hr GTT attempted but patient vomited. She was given Zofran to bring to her repeat test. She reports she will come in for the repeat test tomorrow morning.  5. Usual prenatal care: - Infant feeding choice: will try breast feed, may use formula - Infant circumcision desired Yes - Tdap was given today. - CBC, RPR, and HIV were done today.   - Rh status was reviewed and patient does not need Rhogam. - Preterm labor and fetal movement precautions reviewed. - Follow up 2 weeks with OB clinic. Scheduled by CMA at today's visit.

## 2017-09-25 NOTE — Patient Instructions (Signed)
It was a pleasure seeing you today in our clinic. You will get blood work and give urine at today's visit. Please come in tomorrow for your glucose tolerance test.  Our clinic's number is [phone]. Please call with questions or concerns about what we discussed today.  Be well, Dr. Mosetta PuttFeng  Glucose Tolerance Test During Pregnancy The glucose tolerance test (GTT) is a blood test used to determine if you have developed a type of diabetes during pregnancy (gestational diabetes). This is when your body does not properly process sugar (glucose) in the food you eat, resulting in high blood glucose levels. Typically, a GTT is done after you have had a 1-hour glucose test with results that indicate you possibly have gestational diabetes. It may also be done if:  You have a history of giving birth to very large babies or have experienced repeated fetal loss (stillbirth).  You have signs and symptoms of diabetes, such as: ? Changes in your vision. ? Tingling or numbness in your hands or feet. ? Changes in hunger, thirst, and urination not otherwise explained by your pregnancy.  The GTT lasts about 3 hours. You will be given a sugar-water solution to drink at the beginning of the test. You will have blood drawn before you drink the solution and then again 1, 2, and 3 hours after you drink it. You will not be allowed to eat or drink anything else during the test. You must remain at the testing location to make sure that your blood is drawn on time. You should also avoid exercising during the test, because exercise can alter test results. How do I prepare for this test? Eat normally for 3 days prior to the GTT test, including having plenty of carbohydrate-rich foods. Do not eat or drink anything except water during the final 12 hours before the test. In addition, your health care provider may ask you to stop taking certain medicines before the test. What do the results mean? It is your responsibility to  obtain your test results. Ask the lab or department performing the test when and how you will get your results. Contact your health care provider to discuss any questions you have about your results. Range of Normal Values Ranges for normal values may vary among different labs and hospitals. You should always check with your health care provider after having lab work or other tests done to discuss whether your values are considered within normal limits. Normal levels of blood glucose are as follows:  Fasting: less than 105 mg/dL.  1 hour after drinking the solution: less than 190 mg/dL.  2 hours after drinking the solution: less than 165 mg/dL.  3 hours after drinking the solution: less than 145 mg/dL.  Some substances can interfere with GTT results. These may include:  Blood pressure and heart failure medicines, including beta blockers, furosemide, and thiazides.  Anti-inflammatory medicines, including aspirin.  Nicotine.  Some psychiatric medicines.  Meaning of Results Outside Normal Value Ranges GTT test results that are above normal values may indicate a number of health problems, such as:  Gestational diabetes.  Acute stress response.  Cushing syndrome.  Tumors such as pheochromocytoma or glucagonoma.  Long-term kidney problems.  Pancreatitis.  Hyperthyroidism.  Current infection.  Discuss your test results with your health care provider. He or she will use the results to make a diagnosis and determine a treatment plan that is right for you. This information is not intended to replace advice given to you by your health  care provider. Make sure you discuss any questions you have with your health care provider. Document Released: 02/21/2012 Document Revised: 01/28/2016 Document Reviewed: 12/27/2013 Elsevier Interactive Patient Education  [employer].

## 2017-09-26 LAB — CBC
Hematocrit: 32.8 % — ABNORMAL LOW (ref 34.0–46.6)
Hemoglobin: 11.1 g/dL (ref 11.1–15.9)
MCH: 30.1 pg (ref 26.6–33.0)
MCHC: 33.8 g/dL (ref 31.5–35.7)
MCV: 89 fL (ref 79–97)
PLATELETS: 166 10*3/uL (ref 150–379)
RBC: 3.69 x10E6/uL — ABNORMAL LOW (ref 3.77–5.28)
RDW: 13.5 % (ref 12.3–15.4)
WBC: 12.3 10*3/uL — AB (ref 3.4–10.8)

## 2017-09-26 LAB — RPR: RPR: NONREACTIVE

## 2017-09-26 LAB — HIV ANTIBODY (ROUTINE TESTING W REFLEX): HIV Screen 4th Generation wRfx: NONREACTIVE

## 2017-09-27 LAB — CULTURE, OB URINE

## 2017-09-27 LAB — URINE CULTURE, OB REFLEX: Organism ID, Bacteria: NO GROWTH

## 2017-10-19 ENCOUNTER — Encounter: Payer: Self-pay | Admitting: Family Medicine

## 2017-10-19 ENCOUNTER — Ambulatory Visit: Payer: Medicaid Other

## 2017-10-19 ENCOUNTER — Ambulatory Visit (INDEPENDENT_AMBULATORY_CARE_PROVIDER_SITE_OTHER): Payer: Medicaid Other | Admitting: Family Medicine

## 2017-10-19 VITALS — BP 100/60 | HR 94 | Temp 98.1°F | Ht 61.0 in | Wt 165.0 lb

## 2017-10-19 DIAGNOSIS — Z3483 Encounter for supervision of other normal pregnancy, third trimester: Secondary | ICD-10-CM

## 2017-10-19 MED ORDER — ONDANSETRON 4 MG PO TBDP: 4.0000 mg | ORAL_TABLET | Freq: Once | ORAL | 0 refills | Status: AC

## 2017-10-19 NOTE — Patient Instructions (Addendum)
It was nice to meet you today!  If you have any cramping/contractions, vaginal bleeding, fluid leaking, or are worried that baby is not moving normally, [facility] to [facility].   Schedule appointment for 3 hour glucose test. Sent in zofran for you to take before the test.  Also schedule appointment for pelvic exam since you did not want this today, to check out the discharge and cramping.  Next regular prenatal visit in 2 weeks with Dr. Mosetta PuttFeng.  Be well, Dr. Pollie MeyerMcIntyre

## 2017-10-24 NOTE — Progress Notes (Signed)
Dagmar Haityana Swinford is a 22 y.o. G2P1001 at 10886w2d (dated by LMP=9w u/s) here for [facility] up in [facility].  She reports increased discharge (creamy/white) but denies LOF. Denies possibility of STD exposure. Has occasional irregular contractions, which she believes are braxton hicks contractions. Declines pelvic exam today to assess these issues. Reports good fetal movement. No vaginal bleeding. Taking PNV.  See flow sheet for details.  A/P: Pregnancy at 7486w2d.  Doing well.   Pregnancy issues include:  1. Failed 1 hour GTT (144) - patient vomited during 3 hour GTT when previously attempted. rx of zofran ODT given & patient instructed to reschedule 3 hour GTT. Importance of this reinforced to patient.   2. Bipolar disorder - noted on history. Patient endorses mood swings but denies SI/HI. Does not want medications or counseling. Thinks things are manageable. Continue to monitor.  3. Irregular contractions & vaginal discharge - patient declined pelvic exam to assess these today. Discussed return precautions, also encouraged her to schedule appointment to have these assessed.   4. Routine care: - Discussed importance of flu shot. Will need to check [facility] for evidence of whether she has had this, as patient believes she may have already had it. Patient declines this today. - Tdap previously given - attempted to have patient fill out PHQ-9 and pregnancy medical home forms today but patient could not stay for visit long enough to fill these out. Recommend doing these at future visit as I could not see evidence that they were done at 28 weeks.  Preterm labor and fetal movement precautions reviewed. Follow up 2 weeks.  Levert FeinsteinBrittany McIntyre, MD [facility]

## 2017-10-30 ENCOUNTER — Encounter (HOSPITAL_COMMUNITY): Payer: Self-pay | Admitting: *Deleted

## 2017-10-30 ENCOUNTER — Other Ambulatory Visit: Payer: Self-pay

## 2017-10-30 ENCOUNTER — Inpatient Hospital Stay (HOSPITAL_COMMUNITY)
Admission: AD | Admit: 2017-10-30 | Discharge: 2017-10-30 | Disposition: A | Discharge status: Home / Self Care | Payer: Medicaid Other | Source: Ambulatory Visit | Attending: Obstetrics & Gynecology | Admitting: Obstetrics & Gynecology

## 2017-10-30 DIAGNOSIS — R109 Unspecified abdominal pain: Secondary | ICD-10-CM | POA: Diagnosis not present

## 2017-10-30 DIAGNOSIS — Z3A34 34 weeks gestation of pregnancy: Secondary | ICD-10-CM | POA: Insufficient documentation

## 2017-10-30 DIAGNOSIS — O99343 Other mental disorders complicating pregnancy, third trimester: Secondary | ICD-10-CM | POA: Insufficient documentation

## 2017-10-30 DIAGNOSIS — F319 Bipolar disorder, unspecified: Secondary | ICD-10-CM | POA: Diagnosis not present

## 2017-10-30 DIAGNOSIS — O4703 False labor before 37 completed weeks of gestation, third trimester: Secondary | ICD-10-CM | POA: Insufficient documentation

## 2017-10-30 DIAGNOSIS — Z825 Family history of asthma and other chronic lower respiratory diseases: Secondary | ICD-10-CM | POA: Diagnosis not present

## 2017-10-30 DIAGNOSIS — Z3689 Encounter for other specified antenatal screening: Secondary | ICD-10-CM | POA: Diagnosis not present

## 2017-10-30 DIAGNOSIS — Z833 Family history of diabetes mellitus: Secondary | ICD-10-CM | POA: Diagnosis not present

## 2017-10-30 DIAGNOSIS — O479 False labor, unspecified: Secondary | ICD-10-CM | POA: Diagnosis not present

## 2017-10-30 DIAGNOSIS — Z9889 Other specified postprocedural states: Secondary | ICD-10-CM | POA: Insufficient documentation

## 2017-10-30 DIAGNOSIS — Z8249 Family history of ischemic heart disease and other diseases of the circulatory system: Secondary | ICD-10-CM | POA: Diagnosis not present

## 2017-10-30 DIAGNOSIS — F419 Anxiety disorder, unspecified: Secondary | ICD-10-CM | POA: Diagnosis not present

## 2017-10-30 DIAGNOSIS — O212 Late vomiting of pregnancy: Secondary | ICD-10-CM | POA: Diagnosis not present

## 2017-10-30 DIAGNOSIS — Z79899 Other long term (current) drug therapy: Secondary | ICD-10-CM | POA: Diagnosis not present

## 2017-10-30 NOTE — MAU Provider Note (Signed)
History     CSN: [id number]  Arrival date and time: 10/30/17 4098   First Provider Initiated Contact with Patient 10/30/17 1026      Chief Complaint  Patient presents with  . Emesis  . Nausea  . Contractions   G2P1001 @34 .6 wks here with ctx. She reports ctx started yesterday afternoon. She is unsure of frequency. Pt reports ctx improved since this am, but wanted to come get checked out. Denies VB and LOF. Reports good FM. Denies urinary sx.    OB History    Gravida Para Term Preterm AB Living   2 1 1     1    SAB TAB Ectopic Multiple Live Births         0 1      Past Medical History:  Diagnosis Date  . Anemia    during last pregnancy diagnosed   . Anxiety   . Bipolar disorder (HCC)   . Bipolar disorder (HCC)   . Depression    on going started diagnosed as child. not currently on medication   . Headache   . Pyelonephritis affecting pregnancy in first trimester     Past Surgical History:  Procedure Laterality Date  . TONSILLECTOMY      Family History  Problem Relation Age of Onset  . Hypertension Mother   . Hyperlipidemia Mother   . Diabetes Maternal Grandmother        type II  . Congenital Murmur Maternal Grandmother        with VSD  . Diabetes Maternal Grandfather        type II  . Asthma Brother   . Autism spectrum disorder Brother     Social History   Tobacco Use  . Smoking status: Never Smoker  . Smokeless tobacco: Never Used  Substance Use Topics  . Alcohol use: No  . Drug use: No    Allergies: No Known Allergies  Medications Prior to Admission  Medication Sig Dispense Refill Last Dose  . prenatal vitamin w/FE, FA (NATACHEW) 29-1 MG CHEW chewable tablet Chew 1 tablet by mouth daily at 12 noon. 30 tablet 6 Taking    Review of Systems  Gastrointestinal: Positive for abdominal pain (ctx).  Genitourinary: Negative for dysuria, frequency, urgency, vaginal bleeding and vaginal discharge.   Physical Exam   Blood pressure 116/61, pulse 90,  temperature 98.3 F (36.8 C), resp. rate 17, weight 167 lb (75.8 kg), last menstrual period 02/28/2017, SpO2 100 %, unknown if currently breastfeeding.  Physical Exam  Nursing note and vitals reviewed. Constitutional: She is oriented to person, place, and time. She appears well-developed and well-nourished. No distress.  HENT:  Head: Normocephalic and atraumatic.  Neck: Normal range of motion.  Cardiovascular: Normal rate.  Respiratory: Effort normal. No respiratory distress.  GI: Soft. She exhibits no distension. There is no tenderness.  gravid  Genitourinary:  Genitourinary Comments: Cervix closed/thick/posterior  Musculoskeletal: Normal range of motion.  Neurological: She is alert and oriented to person, place, and time.  Skin: Skin is warm and dry.  Psychiatric: She has a normal mood and affect.  EFM: 135 bpm, mod variability, + accels, no decels Toco: none  MAU Course  Procedures  MDM No evidence of PTL. Recommend increased hydration and off feet as needed. Stable for discharge home.  Assessment and Plan   1. [redacted] weeks gestation of pregnancy   2. Braxton Hicks contractions   3. NST (non-stress test) reactive    Discharge home Follow up in OB  office this week as scheduled PTL precautions  Allergies as of 10/30/2017   No Known Allergies     Medication List    TAKE these medications   prenatal vitamin w/FE, FA 29-1 MG Chew chewable tablet Chew 1 tablet by mouth daily at 12 noon.      Donette LarryMelanie Kordelia Severin, CNM 10/30/2017, 10:36 AM

## 2017-10-30 NOTE — MAU Note (Signed)
Been contracting since yesterday afternoon. Got bad during the night, no leaking or bleeding.  Has been nauseated and throwing up.

## 2017-10-30 NOTE — Discharge Instructions (Signed)
Braxton Hicks Contractions °Contractions of the uterus can occur throughout pregnancy, but they are not always a sign that you are in labor. You may have practice contractions called Braxton Hicks contractions. These false labor contractions are sometimes confused with true labor. °What are Braxton Hicks contractions? °Braxton Hicks contractions are tightening movements that occur in the muscles of the uterus before labor. Unlike true labor contractions, these contractions do not result in opening (dilation) and thinning of the cervix. Toward the end of pregnancy (32-34 weeks), Braxton Hicks contractions can happen more often and may become stronger. These contractions are sometimes difficult to tell apart from true labor because they can be very uncomfortable. You should not feel embarrassed if you go to the hospital with false labor. °Sometimes, the only way to tell if you are in true labor is for your health care provider to look for changes in the cervix. The health care provider will do a physical exam and may monitor your contractions. If you are not in true labor, the exam should show that your cervix is not dilating and your water has not broken. °If there are other health problems associated with your pregnancy, it is completely safe for you to be sent home with false labor. You may continue to have Braxton Hicks contractions until you go into true labor. °How to tell the difference between true labor and false labor °True labor °· Contractions last 30-70 seconds. °· Contractions become very regular. °· Discomfort is usually felt in the top of the uterus, and it spreads to the lower abdomen and low back. °· Contractions do not go away with walking. °· Contractions usually become more intense and increase in frequency. °· The cervix dilates and gets thinner. °False labor °· Contractions are usually shorter and not as strong as true labor contractions. °· Contractions are usually irregular. °· Contractions  are often felt in the front of the lower abdomen and in the groin. °· Contractions may go away when you walk around or change positions while lying down. °· Contractions get weaker and are shorter-lasting as time goes on. °· The cervix usually does not dilate or become thin. °Follow these instructions at home: °· Take over-the-counter and prescription medicines only as told by your health care provider. °· Keep up with your usual exercises and follow other instructions from your health care provider. °· Eat and drink lightly if you think you are going into labor. °· If Braxton Hicks contractions are making you uncomfortable: °? Change your position from lying down or resting to walking, or change from walking to resting. °? Sit and rest in a tub of warm water. °? Drink enough fluid to keep your urine pale yellow. Dehydration may cause these contractions. °? Do slow and deep breathing several times an hour. °· Keep all follow-up prenatal visits as told by your health care provider. This is important. °Contact a health care provider if: °· You have a fever. °· You have continuous pain in your abdomen. °Get help right away if: °· Your contractions become stronger, more regular, and closer together. °· You have fluid leaking or gushing from your vagina. °· You pass blood-tinged mucus (bloody show). °· You have bleeding from your vagina. °· You have low back pain that you never had before. °· You feel your baby’s head pushing down and causing pelvic pressure. °· Your baby is not moving inside you as much as it used to. °Summary °· Contractions that occur before labor are called Braxton   Hicks contractions, false labor, or practice contractions. °· Braxton Hicks contractions are usually shorter, weaker, farther apart, and less regular than true labor contractions. True labor contractions usually become progressively stronger and regular and they become more frequent. °· Manage discomfort from Braxton Hicks contractions by  changing position, resting in a warm bath, drinking plenty of water, or practicing deep breathing. °This information is not intended to replace advice given to you by your health care provider. Make sure you discuss any questions you have with your health care provider. °Document Released: 01/05/2017 Document Revised: 01/05/2017 Document Reviewed: 01/05/2017 °Elsevier Interactive Patient Education © 2018 Elsevier Inc. ° °

## 2017-10-31 ENCOUNTER — Other Ambulatory Visit: Payer: Self-pay

## 2017-10-31 ENCOUNTER — Other Ambulatory Visit: Payer: Medicaid Other

## 2017-10-31 ENCOUNTER — Ambulatory Visit (INDEPENDENT_AMBULATORY_CARE_PROVIDER_SITE_OTHER): Payer: Medicaid Other | Admitting: Student in an Organized Health Care Education/Training Program

## 2017-10-31 ENCOUNTER — Encounter: Payer: Self-pay | Admitting: Student in an Organized Health Care Education/Training Program

## 2017-10-31 ENCOUNTER — Other Ambulatory Visit (HOSPITAL_COMMUNITY)
Admission: RE | Admit: 2017-10-31 | Discharge: 2017-10-31 | Disposition: A | Discharge status: Home / Self Care | Payer: Medicaid Other | Source: Ambulatory Visit | Attending: Family Medicine | Admitting: Family Medicine

## 2017-10-31 VITALS — BP 102/66 | HR 98 | Temp 98.0°F | Wt 169.0 lb

## 2017-10-31 DIAGNOSIS — Z3483 Encounter for supervision of other normal pregnancy, third trimester: Secondary | ICD-10-CM

## 2017-10-31 DIAGNOSIS — R112 Nausea with vomiting, unspecified: Secondary | ICD-10-CM | POA: Diagnosis not present

## 2017-10-31 LAB — OB RESULTS CONSOLE GBS: GBS: NEGATIVE

## 2017-10-31 LAB — POCT CBG (FASTING - GLUCOSE)-MANUAL ENTRY: GLUCOSE FASTING, POC: 89 mg/dL (ref 70–99)

## 2017-10-31 MED ORDER — ONDANSETRON 4 MG PO TBDP
4.0000 mg | ORAL_TABLET | Freq: Once | ORAL | Status: AC
  Administered 2017-10-31: 4 mg via ORAL

## 2017-10-31 NOTE — Patient Instructions (Addendum)
It was a pleasure seeing you today in our clinic.  Our clinic's number is [phone]. Please call with questions or concerns about what we discussed today.  Be well, Dr. Bethannie Iglehart   Third Trimester of Pregnancy The third trimester is from week 29 through week 42, months 7 through 9. This trimester is when your unborn baby (fetus) is growing very fast. At the end of the ninth month, the unborn baby is about 20 inches in length. It weighs about 6-10 pounds. Follow these instructions at home:  Avoid all smoking, herbs, and alcohol. Avoid drugs not approved by your doctor.  Do not use any tobacco products, including cigarettes, chewing tobacco, and electronic cigarettes. If you need help quitting, ask your doctor. You may get counseling or other support to help you quit.  Only take medicine as told by your doctor. Some medicines are safe and some are not during pregnancy.  Exercise only as told by your doctor. Stop exercising if you start having cramps.  Eat regular, healthy meals.  Wear a good support bra if your breasts are tender.  Do not use hot tubs, steam rooms, or saunas.  Wear your seat belt when driving.  Avoid raw meat, uncooked cheese, and liter boxes and soil used by cats.  Take your prenatal vitamins.  Take 1500-2000 milligrams of calcium daily starting at the 20th week of pregnancy until you deliver your baby.  Try taking medicine that helps you poop (stool softener) as needed, and if your doctor approves. Eat more fiber by eating fresh fruit, vegetables, and whole grains. Drink enough fluids to keep your pee (urine) clear or pale yellow.  Take warm water baths (sitz baths) to soothe pain or discomfort caused by hemorrhoids. Use hemorrhoid cream if your doctor approves.  If you have puffy, bulging veins (varicose veins), wear support hose. Raise (elevate) your feet for 15 minutes, 3-4 times a day. Limit salt in your diet.  Avoid heavy lifting, wear low heels, and sit up  straight.  Rest with your legs raised if you have leg cramps or low back pain.  Visit your dentist if you have not gone during your pregnancy. Use a soft toothbrush to brush your teeth. Be gentle when you floss.  You can have sex (intercourse) unless your doctor tells you not to.  Do not travel far distances unless you must. Only do so with your doctor's approval.  Take prenatal classes.  Practice driving to the hospital.  Pack your hospital bag.  Prepare the baby's room.  Go to your doctor visits. Get help if:  You are not sure if you are in labor or if your water has broken.  You are dizzy.  You have mild cramps or pressure in your lower belly (abdominal).  You have a nagging pain in your belly area.  You continue to feel sick to your stomach (nauseous), throw up (vomit), or have watery poop (diarrhea).  You have bad smelling fluid coming from your vagina.  You have pain with peeing (urination). Get help right away if:  You have a fever.  You are leaking fluid from your vagina.  You are spotting or bleeding from your vagina.  You have severe belly cramping or pain.  You lose or gain weight rapidly.  You have trouble catching your breath and have chest pain.  You notice sudden or extreme puffiness (swelling) of your face, hands, ankles, feet, or legs.  You have not felt the baby move in over an hour.    You have severe headaches that do not go away with medicine.  You have vision changes. This information is not intended to replace advice given to you by your health care provider. Make sure you discuss any questions you have with your health care provider. Document Released: 11/16/2009 Document Revised: 01/28/2016 Document Reviewed: 10/23/2012 Elsevier Interactive Patient Education  2017 Elsevier Inc.  

## 2017-10-31 NOTE — Progress Notes (Signed)
Tara Gonzalez is a 22 y.o. G2P1001 at 7546w0d here for routine follow up.  She reports +FM, no LOF, no bleeding, normal vaginal discharge.  See flow sheet for details.  A/P: Pregnancy at 446w0d.  Doing well.   Pregnancy issues include:  1. Failed 1 hour GTT (144) - patient vomited during 3 hour GTT when previously attempted.  Patient subsequently refused multiple times to have this tested again.  Patient is agreeable to having 3-hour GTT completed today.  She was given 4 mg of Zofran in the office prior to drinking.  We will follow up the results.  2. Bipolar disorder - noted on history.  Patient reports she feels her mood is under control.  PHQ 9 was 2 today.  Denying suicidal and homicidal ideation.  Continue to monitor  3. Irregular contractions --she was seen in the MAU yesterday for vaginal contractions.  NST was reassuring.  She has not been having contractions since that time.  She feels that her stomach was hard, she feels that this is because she is constantly on her feet and working.  Discussed putting her feet up as needed, staying hydrated, reasons to return to care/to the MAU.  4. Routine care:  - Went over importance of flu shot with patient. She refuses. She has not gotten this previously. - Tdap previously given - PHQ.9 filled out today, low score at 2. Pregnancy medical home forms completed today. - GBS, GC/chla collected today.  - Infant feeding choice: will trying to breast feed - Contraception choice: Nexplanon - Infant circumcision desired: yes - Preterm labor and fetal movement precautions reviewed. - Safe sleep discussed.  Follow up 2 week(s).

## 2017-11-01 ENCOUNTER — Telehealth: Payer: Self-pay

## 2017-11-01 LAB — GESTATIONAL GLUCOSE TOLERANCE
GLUCOSE 2 HOUR GTT: 121 mg/dL (ref 65–154)
Glucose, Fasting: 79 mg/dL (ref 65–94)
Glucose, GTT - 1 Hour: 93 mg/dL (ref 65–179)
Glucose, GTT - 3 Hour: 118 mg/dL (ref 65–139)

## 2017-11-01 LAB — CERVICOVAGINAL ANCILLARY ONLY
CHLAMYDIA, DNA PROBE: NEGATIVE
NEISSERIA GONORRHEA: NEGATIVE

## 2017-11-01 NOTE — Telephone Encounter (Signed)
-----   Message from Howard PouchLauren Feng, MD sent at 11/01/2017  1:35 PM EST ----- Regarding: GTT Please call and let the patient know that her test for gestational diabetes was negative.  Thank you, Lauren  ----- Message ----- From: Jennette BillBusick, Robert L, CMA Sent: 10/31/2017  10:02 AM To: Howard PouchLauren Feng, MD

## 2017-11-01 NOTE — Telephone Encounter (Signed)
Pt informed. Lavender Stanke T Dabria Wadas, CMA  

## 2017-11-03 ENCOUNTER — Encounter: Payer: Medicaid Other | Admitting: Student in an Organized Health Care Education/Training Program

## 2017-11-04 LAB — CULTURE, BETA STREP (GROUP B ONLY): STREP GP B CULTURE: NEGATIVE

## 2017-11-15 ENCOUNTER — Encounter: Payer: Medicaid Other | Admitting: Student in an Organized Health Care Education/Training Program

## 2017-11-17 ENCOUNTER — Other Ambulatory Visit: Payer: Self-pay

## 2017-11-17 ENCOUNTER — Ambulatory Visit (INDEPENDENT_AMBULATORY_CARE_PROVIDER_SITE_OTHER): Payer: Medicaid Other | Admitting: Student in an Organized Health Care Education/Training Program

## 2017-11-17 ENCOUNTER — Encounter: Payer: Self-pay | Admitting: Student in an Organized Health Care Education/Training Program

## 2017-11-17 VITALS — BP 108/62 | HR 95 | Temp 98.9°F | Wt 172.8 lb

## 2017-11-17 DIAGNOSIS — Z3483 Encounter for supervision of other normal pregnancy, third trimester: Secondary | ICD-10-CM

## 2017-11-17 NOTE — Patient Instructions (Signed)
Schedule appointment for one week from today.  Sign up for My Chart to have easy access to your labs results, and communication with your primary care physician.  Our clinic's number is [phone]. Please call with questions or concerns about what we discussed today.  Be well, Dr. Mosetta PuttFeng   [facility] Before your baby arrives it is important to:  Have all of the supplies that you will need to care for your baby.  Know where to go if there is an emergency.  Discuss the baby's arrival with other family members.  What supplies will I need?  It is recommended that you have the following supplies: Large Items  Crib.  Crib mattress.  Rear-facing infant car seat. If possible, have a trained professional check to make sure that it is installed correctly.  Feeding  6-8 bottles that are 4-5 oz in size.  6-8 nipples.  Bottle brush.  Sterilizer, or a large pan or kettle with a lid.  A way to boil and cool water.  If you will be breastfeeding: ? Breast pump. ? Nipple cream. ? Nursing bra. ? Breast pads. ? Breast shields.  If you will be formula feeding: ? Formula. ? Measuring cups. ? Measuring spoons.  Bathing  Mild baby soap and baby shampoo.  Petroleum jelly.  Soft cloth towel and washcloth.  Hooded towel.  Cotton balls.  Bath basin.  Other Supplies  Rectal thermometer.  Bulb syringe.  Baby wipes or washcloths for diaper changes.  Diaper bag.  Changing pad.  Clothing, including one-piece outfits and pajamas.  Baby nail clippers.  Receiving blankets.  Mattress pad and sheets for the crib.  Night-light for the baby's room.  Baby monitor.  2 or 3 pacifiers.  Either 24-36 cloth diapers and waterproof diaper covers or a box of disposable diapers. You may need to use as many as 10-12 diapers per day.  How do I prepare for an emergency? Prepare for an emergency by:  Knowing how to get to the nearest hospital.  Listing the  phone numbers of your baby's health care providers near your home phone and in your cell phone.  How do I prepare my family?  Decide how to handle visitors.  If you have other children: ? Talk with them about the baby coming home. Ask them how they feel about it. ? Read a book together about being a new big brother or sister. ? Find ways to let them help you prepare for the new baby. ? Have someone ready to care for them while you are in the hospital. This information is not intended to replace advice given to you by your health care provider. Make sure you discuss any questions you have with your health care provider. Document Released: 08/04/2008 Document Revised: 01/28/2016 Document Reviewed: 07/30/2014 Elsevier Interactive Patient Education  [employer]2018 [employer]

## 2017-11-17 NOTE — Progress Notes (Signed)
Tara Gonzalez is a 22 y.o. G2P1001 at 5717w3d here for routine follow up.  She reports +FM, no LOF, no discharge, no bleeding. See flow sheet for details.  A/P: Pregnancy at 5517w3d. Doing well.   Pregnancy issues noted below.  1. Muscle spasms in her back - lower back, bothers her most at night. No fevers, numbness or tingling. Normal back exam. Recommended rolling out her back against the wall with a lacrosse ball.  2. Irregular contractions - one at a time, not persistent. Labor precautions reviewed.  3. Failed 1 hour GTT - previously noted. She did pass the 3 hour GTT.   4. Bipolar d/o noted on history - no SI/HI. Continue to monitor.  5. Routine care:  Infant feeding choice: plans to try both breast and formula Contraception choice: nexplanon Infant circumcision desired: yes Pain control plan during hospital stay: planning for epidural if she needs it  GBS and gc/chlamydia testing results were not reviewed today.   Labor and fetal movement precautions reviewed. Follow up 1 week.

## 2017-11-24 ENCOUNTER — Other Ambulatory Visit: Payer: Self-pay

## 2017-11-24 ENCOUNTER — Ambulatory Visit (INDEPENDENT_AMBULATORY_CARE_PROVIDER_SITE_OTHER): Payer: Medicaid Other | Admitting: Student in an Organized Health Care Education/Training Program

## 2017-11-24 VITALS — BP 110/60 | HR 105 | Temp 98.7°F | Wt 173.0 lb

## 2017-11-24 DIAGNOSIS — Z3483 Encounter for supervision of other normal pregnancy, third trimester: Secondary | ICD-10-CM

## 2017-11-24 NOTE — Progress Notes (Signed)
Dagmar Haityana Berrett is a 22 y.o. G2P1001 at 6446w3d here for routine follow up.  She reports +FM, no LOF, no abnormal discharge, no bleeding. She is having irregular contractions, had 3 in one hour yesterday but then they went away so she did not go to MAU. See flow sheet for details.  A/P: Pregnancy at 4146w3d. Doing well.   Pregnancy issues include:  1. Irregular contractions - one at a time, not persistent. Labor precautions reviewed  2. Bipolar d/o noted on history - no SI/HI. Continue to monitor.  3. Routine care:  Infant feeding choice: plans to try both breast and formula Contraception choice: nexplanon Infant circumcision desired: yes Pain control plan during hospital stay: planning for epidural if she needs it   GBS and gc/chlamydia testing results were reviewed today.   Labor and fetal movement precautions reviewed. Follow up 1 week.

## 2017-11-24 NOTE — Patient Instructions (Addendum)
Please schedule follow up in one week.  Our clinic's number is [phone]. Please call with questions or concerns about what we discussed today.  Be well, Dr. Mosetta PuttFeng    Third Trimester of Pregnancy The third trimester is from week 29 through week 42, months 7 through 9. This trimester is when your unborn baby (fetus) is growing very fast. At the end of the ninth month, the unborn baby is about 20 inches in length. It weighs about 6-10 pounds. Follow these instructions at home:  Avoid all smoking, herbs, and alcohol. Avoid drugs not approved by your doctor.  Do not use any tobacco products, including cigarettes, chewing tobacco, and electronic cigarettes. If you need help quitting, ask your doctor. You may get counseling or other support to help you quit.  Only take medicine as told by your doctor. Some medicines are safe and some are not during pregnancy.  Exercise only as told by your doctor. Stop exercising if you start having cramps.  Eat regular, healthy meals.  Wear a good support bra if your breasts are tender.  Do not use hot tubs, steam rooms, or saunas.  Wear your seat belt when driving.  Avoid raw meat, uncooked cheese, and liter boxes and soil used by cats.  Take your prenatal vitamins.  Take 1500-2000 milligrams of calcium daily starting at the 20th week of pregnancy until you deliver your baby.  Try taking medicine that helps you poop (stool softener) as needed, and if your doctor approves. Eat more fiber by eating fresh fruit, vegetables, and whole grains. Drink enough fluids to keep your pee (urine) clear or pale yellow.  Take warm water baths (sitz baths) to soothe pain or discomfort caused by hemorrhoids. Use hemorrhoid cream if your doctor approves.  If you have puffy, bulging veins (varicose veins), wear support hose. Raise (elevate) your feet for 15 minutes, 3-4 times a day. Limit salt in your diet.  Avoid heavy lifting, wear low heels, and sit up  straight.  Rest with your legs raised if you have leg cramps or low back pain.  Visit your dentist if you have not gone during your pregnancy. Use a soft toothbrush to brush your teeth. Be gentle when you floss.  You can have sex (intercourse) unless your doctor tells you not to.  Do not travel far distances unless you must. Only do so with your doctor's approval.  Take prenatal classes.  Practice driving to the hospital.  Pack your hospital bag.  Prepare the baby's room.  Go to your doctor visits. Get help if:  You are not sure if you are in labor or if your water has broken.  You are dizzy.  You have mild cramps or pressure in your lower belly (abdominal).  You have a nagging pain in your belly area.  You continue to feel sick to your stomach (nauseous), throw up (vomit), or have watery poop (diarrhea).  You have bad smelling fluid coming from your vagina.  You have pain with peeing (urination). Get help right away if:  You have a fever.  You are leaking fluid from your vagina.  You are spotting or bleeding from your vagina.  You have severe belly cramping or pain.  You lose or gain weight rapidly.  You have trouble catching your breath and have chest pain.  You notice sudden or extreme puffiness (swelling) of your face, hands, ankles, feet, or legs.  You have not felt the baby move in over an hour.  You  have severe headaches that do not go away with medicine.  You have vision changes. This information is not intended to replace advice given to you by your health care provider. Make sure you discuss any questions you have with your health care provider. Document Released: 11/16/2009 Document Revised: 01/28/2016 Document Reviewed: 10/23/2012 Elsevier Interactive Patient Education  2017 [employer].

## 2017-11-26 ENCOUNTER — Encounter (HOSPITAL_COMMUNITY): Payer: Self-pay | Admitting: *Deleted

## 2017-11-26 ENCOUNTER — Inpatient Hospital Stay (HOSPITAL_COMMUNITY): Payer: Medicaid Other

## 2017-11-26 ENCOUNTER — Inpatient Hospital Stay (HOSPITAL_COMMUNITY)
Admission: AD | Admit: 2017-11-26 | Discharge: 2017-11-26 | Disposition: A | Discharge status: Home / Self Care | Payer: Medicaid Other | Source: Ambulatory Visit | Attending: Obstetrics and Gynecology | Admitting: Obstetrics and Gynecology

## 2017-11-26 ENCOUNTER — Other Ambulatory Visit: Payer: Self-pay

## 2017-11-26 DIAGNOSIS — O479 False labor, unspecified: Secondary | ICD-10-CM

## 2017-11-26 DIAGNOSIS — O368390 Maternal care for abnormalities of the fetal heart rate or rhythm, unspecified trimester, not applicable or unspecified: Secondary | ICD-10-CM

## 2017-11-26 DIAGNOSIS — Z0371 Encounter for suspected problem with amniotic cavity and membrane ruled out: Secondary | ICD-10-CM | POA: Diagnosis not present

## 2017-11-26 DIAGNOSIS — O219 Vomiting of pregnancy, unspecified: Secondary | ICD-10-CM | POA: Diagnosis not present

## 2017-11-26 DIAGNOSIS — O368990 Maternal care for other specified fetal problems, unspecified trimester, not applicable or unspecified: Secondary | ICD-10-CM | POA: Diagnosis present

## 2017-11-26 DIAGNOSIS — O368930 Maternal care for other specified fetal problems, third trimester, not applicable or unspecified: Secondary | ICD-10-CM | POA: Diagnosis not present

## 2017-11-26 DIAGNOSIS — Z3A38 38 weeks gestation of pregnancy: Secondary | ICD-10-CM | POA: Insufficient documentation

## 2017-11-26 DIAGNOSIS — O471 False labor at or after 37 completed weeks of gestation: Secondary | ICD-10-CM | POA: Insufficient documentation

## 2017-11-26 DIAGNOSIS — O212 Late vomiting of pregnancy: Secondary | ICD-10-CM | POA: Insufficient documentation

## 2017-11-26 LAB — URINALYSIS, ROUTINE W REFLEX MICROSCOPIC
BILIRUBIN URINE: NEGATIVE
Glucose, UA: NEGATIVE mg/dL
Hgb urine dipstick: NEGATIVE
KETONES UR: NEGATIVE mg/dL
NITRITE: NEGATIVE
PROTEIN: NEGATIVE mg/dL
Specific Gravity, Urine: 1.015 (ref 1.005–1.030)
pH: 6.5 (ref 5.0–8.0)

## 2017-11-26 LAB — URINALYSIS, MICROSCOPIC (REFLEX)

## 2017-11-26 LAB — AMNISURE RUPTURE OF MEMBRANE (ROM) NOT AT ARMC: Amnisure ROM: NEGATIVE

## 2017-11-26 MED ORDER — ONDANSETRON 8 MG PO TBDP: 8.0000 mg | ORAL_TABLET | Freq: Once | ORAL | Status: DC | PRN

## 2017-11-26 MED ORDER — ONDANSETRON 8 MG PO TBDP: 8.0000 mg | ORAL_TABLET | Freq: Once | ORAL | 1 refills | Status: DC | PRN

## 2017-11-26 NOTE — MAU Note (Signed)
Pt. Here due to vomiting x4 this morning, along with irregular contractions, 3-4 mins apart. Pain 7/10. Pain in lower abd. Positive for fetal  Movement, denies sudden gush of fluid and no vaginal bleeding. EFM applied - FHR 140s, toco applied, abd. Soft. Pt. Also complain of occasional "wet panties".

## 2017-11-26 NOTE — MAU Provider Note (Signed)
Chief Complaint  Patient presents with  . Emesis  . Contractions     First Provider Initiated Contact with Patient 11/26/17 [phone]      S: Tara Gonzalez  is a 22 y.o. y.o. year old 622P1001 female at 803w5d weeks gestation who presents to MAU reporting moderate contractions and vomiting x4 this morning.  Had a variable deceleration.   Contractions: Moderate, irregular Vaginal bleeding: Denies Fetal movement: Normal Leaking of fluid: Denies  Uncomplicated pregnancy.  [facility]  O:  Patient Vitals for the past 24 hrs:  BP Temp Temp src Pulse Resp SpO2 Height Weight  11/26/17 0940 - - - - - 100 % - -  11/26/17 0935 - - - - - 100 % - -  11/26/17 0930 - - - - - 100 % - -  11/26/17 0925 127/72 (!) 97.4 F (36.3 C) Oral 96 18 100 % - -  11/26/17 0920 - - - - - - 5\' 3"  (1.6 m) 176 lb (79.8 kg)   General: NAD Heart: Regular rate Lungs: Normal rate and effort Abd: Soft, NT, Gravid, S=D Pelvic: NEFG, Neg LOF, no blood.  Dilation: 1.5 Effacement (%): 30 Cervical Position: Posterior Station: -3 Presentation: Vertex Exam by:: Katrinka BlazingSmith, CNM  EFM: 140, Moderate variability, 15 x 15 accelerations, variable deceleration x 1. No other decels after 1 hour 20 minutes of monitoring Toco: Irregular, mild-moderate  Also reported having wet underwear to RN (not to CNM). No leaking on pelvic exam. Will send amnisure.  Amnisure neg. Discussed Hx, labs, exam w/ Dr. Earlene Plateravis. Agrees w/ POC. New orders: check AFI.   AFI 13.   MDM - N/V this morning. None in MAU. Declines meds. May have been 2/2 contractions this morning. - Isolated variable decel w/ otherwise reactive tracing. None more decels on prolonged monitoring. Nml AFI. Fetal status overall reassuring.  - Braxton Hicks contractions. No evidence of active preterm labor. - No evidence of ROM.  A: 183w5d week IUP Not in labor 1. Variable fetal heart rate decelerations, antepartum   2. No leakage of amniotic  fluid into vagina   3. [redacted] weeks gestation of pregnancy   4. Nausea/vomiting in pregnancy   5. False labor    P: Discharge home in stable condition. Labor precautions and fetal kick counts. Follow-up as scheduled for prenatal visit or sooner as needed if symptoms worsen. Return to maternity admissions as needed if symptoms worsen.  Katrinka BlazingSmith, [place], CNM 11/26/2017 11:54 AM  2

## 2017-11-26 NOTE — MAU Note (Signed)
Pt. refuses Zofran at this time.

## 2017-11-26 NOTE — Discharge Instructions (Signed)

## 2017-11-26 NOTE — MAU Note (Addendum)
Amnisure collected and sent to lab.

## 2017-11-27 ENCOUNTER — Inpatient Hospital Stay (HOSPITAL_COMMUNITY)
Admission: AD | Admit: 2017-11-27 | Discharge: 2017-11-27 | Disposition: A | Discharge status: Home / Self Care | Payer: Medicaid Other | Source: Ambulatory Visit | Attending: Obstetrics & Gynecology | Admitting: Obstetrics & Gynecology

## 2017-11-27 ENCOUNTER — Telehealth: Payer: Self-pay | Admitting: Student in an Organized Health Care Education/Training Program

## 2017-11-27 ENCOUNTER — Encounter (HOSPITAL_COMMUNITY): Payer: Self-pay

## 2017-11-27 DIAGNOSIS — Z3A39 39 weeks gestation of pregnancy: Secondary | ICD-10-CM | POA: Diagnosis not present

## 2017-11-27 DIAGNOSIS — Z3483 Encounter for supervision of other normal pregnancy, third trimester: Secondary | ICD-10-CM | POA: Diagnosis not present

## 2017-11-27 DIAGNOSIS — O479 False labor, unspecified: Secondary | ICD-10-CM

## 2017-11-27 NOTE — Discharge Instructions (Signed)
Braxton Hicks Contractions °Contractions of the uterus can occur throughout pregnancy, but they are not always a sign that you are in labor. You may have practice contractions called Braxton Hicks contractions. These false labor contractions are sometimes confused with true labor. °What are Braxton Hicks contractions? °Braxton Hicks contractions are tightening movements that occur in the muscles of the uterus before labor. Unlike true labor contractions, these contractions do not result in opening (dilation) and thinning of the cervix. Toward the end of pregnancy (32-34 weeks), Braxton Hicks contractions can happen more often and may become stronger. These contractions are sometimes difficult to tell apart from true labor because they can be very uncomfortable. You should not feel embarrassed if you go to the hospital with false labor. °Sometimes, the only way to tell if you are in true labor is for your health care provider to look for changes in the cervix. The health care provider will do a physical exam and may monitor your contractions. If you are not in true labor, the exam should show that your cervix is not dilating and your water has not broken. °If there are other health problems associated with your pregnancy, it is completely safe for you to be sent home with false labor. You may continue to have Braxton Hicks contractions until you go into true labor. °How to tell the difference between true labor and false labor °True labor °· Contractions last 30-70 seconds. °· Contractions become very regular. °· Discomfort is usually felt in the top of the uterus, and it spreads to the lower abdomen and low back. °· Contractions do not go away with walking. °· Contractions usually become more intense and increase in frequency. °· The cervix dilates and gets thinner. °False labor °· Contractions are usually shorter and not as strong as true labor contractions. °· Contractions are usually irregular. °· Contractions  are often felt in the front of the lower abdomen and in the groin. °· Contractions may go away when you walk around or change positions while lying down. °· Contractions get weaker and are shorter-lasting as time goes on. °· The cervix usually does not dilate or become thin. °Follow these instructions at home: °· Take over-the-counter and prescription medicines only as told by your health care provider. °· Keep up with your usual exercises and follow other instructions from your health care provider. °· Eat and drink lightly if you think you are going into labor. °· If Braxton Hicks contractions are making you uncomfortable: °? Change your position from lying down or resting to walking, or change from walking to resting. °? Sit and rest in a tub of warm water. °? Drink enough fluid to keep your urine pale yellow. Dehydration may cause these contractions. °? Do slow and deep breathing several times an hour. °· Keep all follow-up prenatal visits as told by your health care provider. This is important. °Contact a health care provider if: °· You have a fever. °· You have continuous pain in your abdomen. °Get help right away if: °· Your contractions become stronger, more regular, and closer together. °· You have fluid leaking or gushing from your vagina. °· You pass blood-tinged mucus (bloody show). °· You have bleeding from your vagina. °· You have low back pain that you never had before. °· You feel your baby’s head pushing down and causing pelvic pressure. °· Your baby is not moving inside you as much as it used to. °Summary °· Contractions that occur before labor are called Braxton   Hicks contractions, false labor, or practice contractions. °· Braxton Hicks contractions are usually shorter, weaker, farther apart, and less regular than true labor contractions. True labor contractions usually become progressively stronger and regular and they become more frequent. °· Manage discomfort from Braxton Hicks contractions by  changing position, resting in a warm bath, drinking plenty of water, or practicing deep breathing. °This information is not intended to replace advice given to you by your health care provider. Make sure you discuss any questions you have with your health care provider. °Document Released: 01/05/2017 Document Revised: 01/05/2017 Document Reviewed: 01/05/2017 °Elsevier Interactive Patient Education © 2018 Elsevier Inc. ° °

## 2017-11-27 NOTE — MAU Note (Signed)
Pt reports she has had 4 contracttions in 1 hr.  Denies any vaginal bleeding but feels like she is at the "beginign stage of loosing her mucus Plug.

## 2017-11-27 NOTE — MAU Note (Signed)
I have communicated with Dr. Doroteo GlassmanPhelps and reviewed vital signs:  Vitals:   11/27/17 1428 11/27/17 1429  BP:  129/65  Pulse:  98  Resp: 16   Temp: 98.5 F (36.9 C)     Vaginal exam:  Dilation: 1.5 Effacement (%): Thick Cervical Position: Posterior Station: -3 Exam by:: Ginnie Smartachel Nataliah Hatlestad RN,   Also reviewed contraction pattern and that non-stress test is reactive.  It has been documented that patient is not contracting and had no cervical change since yesterday not indicating active labor.  Patient denies any other complaints.  Based on this report provider has given order for discharge.  A discharge order and diagnosis entered by a provider.   Labor discharge instructions reviewed with patient.

## 2017-11-27 NOTE — Telephone Encounter (Signed)
Spoke to Dr. Gwendolyn GrantWalden. He said [facility] not [facility]. Sunday SpillersSharon T Veatrice Eckstein, CMA

## 2017-11-27 NOTE — Telephone Encounter (Signed)
Pt mother called and said the pt is [redacted] weeks pregnant and having contractions. [facility] and they keep sending the pt home. Pt's legs keep going numb while she is having these contractions [facility]. Please call the pt's mother to discuss this asap. Call [phone]. Please advise

## 2017-12-01 ENCOUNTER — Ambulatory Visit (INDEPENDENT_AMBULATORY_CARE_PROVIDER_SITE_OTHER): Payer: Medicaid Other | Admitting: Student in an Organized Health Care Education/Training Program

## 2017-12-01 ENCOUNTER — Other Ambulatory Visit: Payer: Self-pay

## 2017-12-01 ENCOUNTER — Encounter: Payer: Self-pay | Admitting: Student in an Organized Health Care Education/Training Program

## 2017-12-01 VITALS — BP 100/60 | HR 96 | Temp 98.3°F | Wt 176.0 lb

## 2017-12-01 DIAGNOSIS — Z3483 Encounter for supervision of other normal pregnancy, third trimester: Secondary | ICD-10-CM

## 2017-12-01 NOTE — Patient Instructions (Signed)
It was a pleasure seeing you today in our clinic. Here is the treatment plan we have discussed and agreed upon together:  Please schedule follow up to be seen in 1 week.  Our clinic's number is [phone]. Please call with questions or concerns about what we discussed today.  Be well, Dr. Mosetta PuttFeng

## 2017-12-01 NOTE — Progress Notes (Signed)
Tara Gonzalez is a 22 y.o. G2P1001 at 7771w3d here for routine follow up.  She reports +FM, no LOF, no bleeding, +contractions, but none today. Seen in MAU x2 since last visit for false labor. See flow sheet for details.  A/P: Pregnancy at 6171w3d. Doing well.   Pregnancy issues include: 1. Irregular contractions - seen in MAU x2 for false labor and determined she was not yet in labor. Discussed labor precautions.  2.Bipolar d/o noted on history - no SI/HI. Continue to monitor.  3. Routine care: Infant feeding choice:plans totry bothbreast and formula Contraception choice:nexplanon Infant circumcision desired:yes Pain control plan during hospital stay: planning for epidural if she needs it   GBS and gc/chlamydia testing results were reviewed today.   Labor and fetal movement precautions reviewed. Follow up 1 week.

## 2017-12-07 ENCOUNTER — Other Ambulatory Visit: Payer: Self-pay

## 2017-12-07 ENCOUNTER — Ambulatory Visit (INDEPENDENT_AMBULATORY_CARE_PROVIDER_SITE_OTHER): Payer: Medicaid Other | Admitting: Internal Medicine

## 2017-12-07 ENCOUNTER — Encounter: Payer: Self-pay | Admitting: Internal Medicine

## 2017-12-07 DIAGNOSIS — Z3483 Encounter for supervision of other normal pregnancy, third trimester: Secondary | ICD-10-CM | POA: Diagnosis not present

## 2017-12-07 NOTE — Patient Instructions (Addendum)
Thank you for coming in! You are almost there! You Induction date is tomorrow 12/08/17 at 7: 30AM  We have scheduled you for twice weekly testing which is routine testing we do after 40 weeks.    Fetal Movement Counts Patient Name: ________________________________________________ Patient Due Date: ____________________ What is a fetal movement count? A fetal movement count is the number of times that you feel your baby move during a certain amount of time. This may also be called a fetal kick count. A fetal movement count is recommended for every pregnant woman. You may be asked to start counting fetal movements as early as week 28 of your pregnancy. Pay attention to when your baby is most active. You may notice your baby's sleep and wake cycles. You may also notice things that make your baby move more. You should do a fetal movement count:  When your baby is normally most active.  At the same time each day.  A good time to count movements is while you are resting, after having something to eat and drink. How do I count fetal movements? 1. Find a quiet, comfortable area. Sit, or lie down on your side. 2. Write down the date, the start time and stop time, and the number of movements that you felt between those two times. Take this information with you to your health care visits. 3. For 2 hours, count kicks, flutters, swishes, rolls, and jabs. You should feel at least 10 movements during 2 hours. 4. You may stop counting after you have felt 10 movements. 5. If you do not feel 10 movements in 2 hours, have something to eat and drink. Then, keep resting and counting for 1 hour. If you feel at least 4 movements during that hour, you may stop counting. Contact a health care provider if:  You feel fewer than 4 movements in 2 hours.  Your baby is not moving like he or she usually does. Date: ____________ Start time: ____________ Stop time: ____________ Movements: ____________ Date: ____________ Start  time: ____________ Stop time: ____________ Movements: ____________ Date: ____________ Start time: ____________ Stop time: ____________ Movements: ____________ Date: ____________ Start time: ____________ Stop time: ____________ Movements: ____________ Date: ____________ Start time: ____________ Stop time: ____________ Movements: ____________ Date: ____________ Start time: ____________ Stop time: ____________ Movements: ____________ Date: ____________ Start time: ____________ Stop time: ____________ Movements: ____________ Date: ____________ Start time: ____________ Stop time: ____________ Movements: ____________ Date: ____________ Start time: ____________ Stop time: ____________ Movements: ____________ This information is not intended to replace advice given to you by your health care provider. Make sure you discuss any questions you have with your health care provider. Document Released: 09/21/2006 Document Revised: 04/20/2016 Document Reviewed: 10/01/2015 Elsevier Interactive Patient Education  [employer]2018 Elsevier Inc.

## 2017-12-07 NOTE — Progress Notes (Signed)
Tara Gonzalez is a 22 y.o. G2P1001 at 7475w2d for routine follow up.  She reports no contractions, no leakage of fluid, or vaginal bleeding. Reports of normal fetal movement.    See flow sheet for details.  A/P: Pregnancy at 7875w2d.  Doing well.   Pregnancy issues include:  Bipolar DO noted in history: no SI/HI. Monitoring   Infant feeding choice: both  Contraception choice: Nexplanon  Infant circumcision desired yes GBS/GC/CZ results  were reviewed again. GBS negative Labor precautions reviewed. Kick counts reviewed. Induction scheduled for 12/08/17 at 7:30AM

## 2017-12-08 ENCOUNTER — Inpatient Hospital Stay (HOSPITAL_COMMUNITY)
Admission: RE | Admit: 2017-12-08 | Discharge: 2017-12-10 | DRG: 807 | Disposition: A | Discharge status: Home / Self Care | Payer: Medicaid Other | Source: Ambulatory Visit | Attending: Obstetrics & Gynecology | Admitting: Obstetrics & Gynecology

## 2017-12-08 ENCOUNTER — Inpatient Hospital Stay (HOSPITAL_COMMUNITY): Payer: Medicaid Other | Admitting: Anesthesiology

## 2017-12-08 ENCOUNTER — Encounter (HOSPITAL_COMMUNITY): Payer: Self-pay

## 2017-12-08 DIAGNOSIS — O9902 Anemia complicating childbirth: Secondary | ICD-10-CM | POA: Diagnosis present

## 2017-12-08 DIAGNOSIS — Z3A40 40 weeks gestation of pregnancy: Secondary | ICD-10-CM

## 2017-12-08 DIAGNOSIS — O480 Post-term pregnancy: Secondary | ICD-10-CM | POA: Diagnosis present

## 2017-12-08 DIAGNOSIS — D649 Anemia, unspecified: Secondary | ICD-10-CM | POA: Diagnosis present

## 2017-12-08 DIAGNOSIS — R1110 Vomiting, unspecified: Secondary | ICD-10-CM

## 2017-12-08 LAB — TYPE AND SCREEN
ABO/RH(D): O POS
Antibody Screen: NEGATIVE

## 2017-12-08 LAB — CBC
HCT: 32.8 % — ABNORMAL LOW (ref 36.0–46.0)
Hemoglobin: 10.5 g/dL — ABNORMAL LOW (ref 12.0–15.0)
MCH: 29.4 pg (ref 26.0–34.0)
MCHC: 32 g/dL (ref 30.0–36.0)
MCV: 91.9 fL (ref 78.0–100.0)
PLATELETS: 160 10*3/uL (ref 150–400)
RBC: 3.57 MIL/uL — ABNORMAL LOW (ref 3.87–5.11)
RDW: 14 % (ref 11.5–15.5)
WBC: 13.5 10*3/uL — ABNORMAL HIGH (ref 4.0–10.5)

## 2017-12-08 LAB — RPR: RPR Ser Ql: NONREACTIVE

## 2017-12-08 MED ORDER — EPHEDRINE 5 MG/ML INJ: 10.0000 mg | INTRAVENOUS | Status: DC | PRN

## 2017-12-08 MED ORDER — TERBUTALINE SULFATE 1 MG/ML IJ SOLN: 0.2500 mg | Freq: Once | INTRAMUSCULAR | Status: DC | PRN

## 2017-12-08 MED ORDER — MISOPROSTOL 50MCG HALF TABLET
50.0000 ug | ORAL_TABLET | Freq: Once | ORAL | Status: AC
  Administered 2017-12-08: 50 ug via ORAL
  Filled 2017-12-08: qty 1

## 2017-12-08 MED ORDER — FENTANYL 2.5 MCG/ML BUPIVACAINE 1/10 % EPIDURAL INFUSION (WH - ANES)
14.0000 mL/h | INTRAMUSCULAR | Status: DC | PRN
  Administered 2017-12-08: 14 mL/h via EPIDURAL
  Filled 2017-12-08: qty 100

## 2017-12-08 MED ORDER — OXYTOCIN 40 UNITS IN LACTATED RINGERS INFUSION - SIMPLE MED
1.0000 m[IU]/min | INTRAVENOUS | Status: DC
  Administered 2017-12-08: 2 m[IU]/min via INTRAVENOUS
  Filled 2017-12-08: qty 1000

## 2017-12-08 MED ORDER — OXYTOCIN 40 UNITS IN LACTATED RINGERS INFUSION - SIMPLE MED
2.5000 [IU]/h | INTRAVENOUS | Status: DC
  Administered 2017-12-09: 2.5 [IU]/h via INTRAVENOUS

## 2017-12-08 MED ORDER — ONDANSETRON HCL 4 MG/2ML IJ SOLN: 4.0000 mg | Freq: Four times a day (QID) | INTRAMUSCULAR | Status: DC | PRN

## 2017-12-08 MED ORDER — PHENYLEPHRINE 40 MCG/ML (10ML) SYRINGE FOR IV PUSH (FOR BLOOD PRESSURE SUPPORT): 80.0000 ug | PREFILLED_SYRINGE | INTRAVENOUS | Status: DC | PRN

## 2017-12-08 MED ORDER — ACETAMINOPHEN 325 MG PO TABS: 650.0000 mg | ORAL_TABLET | ORAL | Status: DC | PRN

## 2017-12-08 MED ORDER — LACTATED RINGERS IV SOLN
INTRAVENOUS | Status: DC
  Administered 2017-12-08 (×3): via INTRAVENOUS

## 2017-12-08 MED ORDER — OXYCODONE-ACETAMINOPHEN 5-325 MG PO TABS: 2.0000 | ORAL_TABLET | ORAL | Status: DC | PRN

## 2017-12-08 MED ORDER — LIDOCAINE HCL (PF) 1 % IJ SOLN
INTRAMUSCULAR | Status: DC | PRN
  Administered 2017-12-08 (×2): 6 mL

## 2017-12-08 MED ORDER — LIDOCAINE HCL (PF) 1 % IJ SOLN: 30.0000 mL | INTRAMUSCULAR | Status: DC | PRN

## 2017-12-08 MED ORDER — LACTATED RINGERS IV SOLN: 500.0000 mL | Freq: Once | INTRAVENOUS | Status: DC

## 2017-12-08 MED ORDER — PHENYLEPHRINE 40 MCG/ML (10ML) SYRINGE FOR IV PUSH (FOR BLOOD PRESSURE SUPPORT)
80.0000 ug | PREFILLED_SYRINGE | INTRAVENOUS | Status: DC | PRN
  Filled 2017-12-08: qty 10

## 2017-12-08 MED ORDER — LACTATED RINGERS IV SOLN: 500.0000 mL | INTRAVENOUS | Status: DC | PRN

## 2017-12-08 MED ORDER — OXYTOCIN BOLUS FROM INFUSION
500.0000 mL | Freq: Once | INTRAVENOUS | Status: AC
  Administered 2017-12-08: 500 mL via INTRAVENOUS

## 2017-12-08 MED ORDER — DIPHENHYDRAMINE HCL 50 MG/ML IJ SOLN: 12.5000 mg | INTRAMUSCULAR | Status: DC | PRN

## 2017-12-08 MED ORDER — SOD CITRATE-CITRIC ACID 500-334 MG/5ML PO SOLN: 30.0000 mL | ORAL | Status: DC | PRN

## 2017-12-08 MED ORDER — OXYCODONE-ACETAMINOPHEN 5-325 MG PO TABS: 1.0000 | ORAL_TABLET | ORAL | Status: DC | PRN

## 2017-12-08 NOTE — Anesthesia Procedure Notes (Signed)
Epidural Patient location during procedure: OB Start time: 12/08/2017 3:58 PM End time: 12/08/2017 4:01 PM  Staffing Anesthesiologist: Leilani AbleHatchett, Skiler Tye, MD Performed: anesthesiologist   Preanesthetic Checklist Completed: patient identified, site marked, surgical consent, pre-op evaluation, timeout performed, IV checked, risks and benefits discussed and monitors and equipment checked  Epidural Patient position: sitting Prep: site prepped and draped and DuraPrep Patient monitoring: continuous pulse ox and blood pressure Approach: midline Location: L3-L4 Injection technique: LOR air  Needle:  Needle type: Tuohy  Needle gauge: 17 G Needle length: 9 cm and 9 Needle insertion depth: 5 cm cm Catheter type: closed end flexible Catheter size: 19 Gauge Catheter at skin depth: 10 cm Test dose: negative and Other  Assessment Sensory level: T9 Events: blood not aspirated, injection not painful, no injection resistance, negative IV test and no paresthesia

## 2017-12-08 NOTE — Anesthesia Pain Management Evaluation Note (Signed)
  CRNA Pain Management Visit Note  Patient: Tara Gonzalez, 22 y.o., female  "Hello I am a member of the anesthesia team at [facility]. We have an anesthesia team available at all times to provide care throughout the hospital, including epidural management and anesthesia for C-section. I don't know your plan for the delivery whether it a natural birth, water birth, IV sedation, nitrous supplementation, doula or epidural, but we want to meet your pain goals."   1.Was your pain managed to your expectations on prior hospitalizations?   Yes   2.What is your expectation for pain management during this hospitalization?     Epidural  3.How can we help you reach that goal? epidural  Record the patient's initial score and the patient's pain goal.   Pain: 0  Pain Goal: 5 [facility] wants you to be able to say your pain was always managed very well.  Tara Gonzalez 12/08/2017

## 2017-12-08 NOTE — Anesthesia Preprocedure Evaluation (Signed)
Anesthesia Evaluation  Patient identified by MRN, date of birth, ID band Patient awake    Reviewed: Allergy & Precautions, H&P , NPO status , Patient's Chart, lab work & pertinent test results  Airway Mallampati: I  TM Distance: >3 FB Neck ROM: full    Dental no notable dental hx. (+) Teeth Intact   Pulmonary neg pulmonary ROS,    Pulmonary exam normal breath sounds clear to auscultation       Cardiovascular negative cardio ROS Normal cardiovascular exam Rhythm:regular Rate:Normal     Neuro/Psych    GI/Hepatic negative GI ROS, Neg liver ROS,   Endo/Other  negative endocrine ROS  Renal/GU negative Renal ROS  negative genitourinary   Musculoskeletal   Abdominal (+) + obese,   Peds  Hematology  (+) Blood dyscrasia, anemia ,   Anesthesia Other Findings   Reproductive/Obstetrics (+) Pregnancy                             Anesthesia Physical Anesthesia Plan  ASA: II  Anesthesia Plan: Epidural   Post-op Pain Management:    Induction:   PONV Risk Score and Plan:   Airway Management Planned:   Additional Equipment:   Intra-op Plan:   Post-operative Plan:   Informed Consent: I have reviewed the patients History and Physical, chart, labs and discussed the procedure including the risks, benefits and alternatives for the proposed anesthesia with the patient or authorized representative who has indicated his/her understanding and acceptance.     Plan Discussed with:   Anesthesia Plan Comments:         Anesthesia Quick Evaluation

## 2017-12-08 NOTE — Progress Notes (Signed)
Labor Progress Note Dagmar Haityana Kading is a 22 y.o. G2P1001 at 862w3d presented for postdates IOL  S:  Patient comfortable  O:  BP 125/74   Pulse 94   Temp 98.1 F (36.7 C) (Oral)   Ht 5\' 3"  (1.6 m)   Wt 178 lb 1.3 oz (80.8 kg)   LMP 02/28/2017   BMI 31.55 kg/m   Fetal Tracing:  Baseline: 130 Variability: moderate Accels: 15x15 Decels: none  Toco: 2-4  CVE: Dilation: 3 Effacement (%): 60 Station: -2 Presentation: Vertex Exam by:: Foley,rn   A&P: 22 y.o. G2P1001 2162w3d postdates IOL #Labor: Progressing well. Pitocin started 2x2 #Pain: per patient request #FWB: Cat 1 #GBS negative  Rolm Bookbinderaroline M Shayden Bobier, CNM 2:03 PM

## 2017-12-08 NOTE — H&P (Signed)
OBSTETRIC ADMISSION HISTORY AND PHYSICAL  Tara Gonzalez is a 22 y.o. female G2P1001 with IUP at [redacted]w[redacted]d by 9 week u/s presenting for IOL for postdates. She reports +FMs, No LOF, no VB, no blurry vision, headaches or peripheral edema, and RUQ pain.  She plans on breast and bottle feeding. She request Nexplanon for birth control. She received her prenatal care at [facility]   Dating: By 9 week u/s --->  Estimated Date of Delivery: 12/05/17  Clinic [facility] Prenatal Labs  Dating LMP c/w 9 week U/S Blood type: O pos  Genetic Screen 1 Screen: Declines   AFP:     Quad:     NIPS: Antibody: negative  Anatomic [place] 18w: normal, ask for F/u: Rubella: immune  GTT Early: Not indicated              Third trimester: negative RPR: nonreactive  Flu vaccine given HBsAg: negative  TDaP vaccine                                               Rhogam: HIV: nonreactive   Baby Food  breast and formula                                             GBS: Negative   Contraception Nexplanon Pap: n/a  Circumcision Yes   Pediatrician  CF:  Support Person  [facility]  Prenatal Classes  Hgb electrophoresis:   Prenatal History/Complications:  Past Medical History: Past Medical History:  Diagnosis Date  . Anemia    during last pregnancy diagnosed   . Anxiety   . Bipolar disorder (HCC)   . Bipolar disorder (HCC)   . Depression    on going started diagnosed as child. not currently on medication   . Headache   . Pyelonephritis affecting pregnancy in first trimester     Past Surgical History: Past Surgical History:  Procedure Laterality Date  . TONSILLECTOMY      Obstetrical History: OB History    Gravida  2   Para  1   Term  1   Preterm      AB      Living  1     SAB      TAB      Ectopic      Multiple  0   Live Births  1           Social History: Social History   Socioeconomic History  . Marital status: Single    Spouse name: Not on file  . Number of children: Not on file  . Years of education: Not on  file  . Highest education level: Not on file  Occupational History  . Occupation: Consulting civil engineer   Social Needs  . Financial resource strain: Not on file  . Food insecurity:    Worry: Not on file    Inability: Not on file  . Transportation needs:    Medical: Not on file    Non-medical: Not on file  Tobacco Use  . Smoking status: Never Smoker  . Smokeless tobacco: Never Used  Substance and Sexual Activity  . Alcohol use: No  . Drug use: No  . Sexual activity: Not Currently    Birth control/protection: None  Lifestyle  . Physical activity:    Days per week: Not on file    Minutes per session: Not on file  . Stress: Not on file  Relationships  . Social connections:    Talks on phone: Not on file    Gets together: Not on file    Attends religious service: Not on file    Active member of club or organization: Not on file    Attends meetings of clubs or organizations: Not on file    Relationship status: Not on file  Other Topics Concern  . Not on file  Social History Narrative   Lives with mom, grandma, 2 younger brothers and one older sister.   Does not know dad or his side of the family.     Family History: Family History  Problem Relation Age of Onset  . Hypertension Mother   . Hyperlipidemia Mother   . Diabetes Maternal Grandmother        type II  . Congenital Murmur Maternal Grandmother        with VSD  . Diabetes Maternal Grandfather        type II  . Asthma Brother   . Autism spectrum disorder Brother     Allergies: No Known Allergies  Medications Prior to Admission  Medication Sig Dispense Refill Last Dose  . ondansetron (ZOFRAN-ODT) 8 MG disintegrating tablet Take 1 tablet (8 mg total) by mouth once as needed for nausea or vomiting. (Patient not taking: Reported on 11/27/2017) 20 tablet 1 Not Taking at Unknown time  . prenatal vitamin w/FE, FA (NATACHEW) 29-1 MG CHEW chewable tablet Chew 1 tablet by mouth daily at 12 noon. 30 tablet 6 11/27/2017 at Unknown time      Review of Systems   All systems reviewed and negative except as stated in HPI  Blood pressure 117/62, pulse (!) 101, temperature 98.5 F (36.9 C), temperature source Oral, height 5\' 3"  (1.6 m), weight 178 lb 1.3 oz (80.8 kg), last menstrual period 02/28/2017, unknown if currently breastfeeding. General appearance: alert, cooperative and no distress Lungs: clear to auscultation bilaterally Heart: regular rate and rhythm Abdomen: soft, non-tender; bowel sounds normal Pelvic: n/a Extremities: Homans sign is negative, no sign of DVT DTR's +2 Presentation: cephalic Fetal monitoringBaseline: 140 bpm, Variability: Good {> 6 bpm), Accelerations: Reactive and Decelerations: Absent Uterine activityNone Dilation: 3 Effacement (%): 50 Station: -2 Exam by:: J.Cox, RN   Prenatal labs: ABO, Rh: O/Positive/-- (07/27 1407) Antibody: Negative (07/27 1407) Rubella: 4.82 (07/27 1407) RPR: Non Reactive (01/21 1123)  HBsAg: Negative (07/27 1407)  HIV: Non Reactive (01/21 1123)  GBS: Negative (02/26 0000)   Prenatal Transfer Tool  Maternal Diabetes: No Genetic Screening: Normal Maternal Ultrasounds/Referrals: Normal Fetal Ultrasounds or other Referrals:  None Maternal Substance Abuse:  No Significant Maternal Medications:  None Significant Maternal Lab Results: None  Results for orders placed or performed during the hospital encounter of 12/08/17 (from the past 24 hour(s))  CBC   Collection Time: 12/08/17  7:57 AM  Result Value Ref Range   WBC 13.5 (H) 4.0 - 10.5 K/uL   RBC 3.57 (L) 3.87 - 5.11 MIL/uL   Hemoglobin 10.5 (L) 12.0 - 15.0 g/dL   HCT 84.1 (L) 32.4 - 40.1 %   MCV 91.9 78.0 - 100.0 fL   MCH 29.4 26.0 - 34.0 pg   MCHC 32.0 30.0 - 36.0 g/dL   RDW 02.7 25.3 - 66.4 %   Platelets 160 150 - 400 K/uL  Patient Active Problem List   Diagnosis Date Noted  . Indication for care in labor or delivery 12/08/2017  . Hyperemesis 05/20/2017  . Supervision of normal pregnancy  03/30/2017  . Grief reaction 09/11/2015  . Abnormal liver function tests 08/01/2015  . Asymptomatic bacteriuria 06/09/2015  . Family history of high cholesterol 04/16/2013  . Bipolar disorder (HCC) 05/02/2012    Assessment/Plan:  Tara Gonzalez is a 22 y.o. G2P1001 at 3536w3d here for postdates IOL  #Labor: cytotec for cervical ripening #Pain: Per patient request #FWB: Cat 1 #ID:  GBS neg #MOF: both #MOC: Nexplanon #Circ:  outpatient  Rolm Bookbinderaroline M Akasia Ahmad, CNM  12/08/2017, 8:57 AM

## 2017-12-08 NOTE — Progress Notes (Signed)
LABOR PROGRESS NOTE  Subjective:  Patient seen and examined for progress of labor. Patient comfortable with epidural.   Objective:  Vitals:   12/08/17 2031 12/08/17 2101 12/08/17 2131 12/08/17 2201  BP: 124/75 123/71 133/76 131/67  Pulse: 88 92 73 74  Resp: 16 16 16 16   Temp:      TempSrc:      SpO2:      Weight:      Height:       Dilation: 8 Effacement (%): 90 Station: Plus 1 Presentation: Vertex Exam by:: Dr. Abelardo DieselMcMullen FHT: 150 bpm, moderate variability, accelerations present, absent decelerations TOCO: irregular, every 3-5 minutes  Assessment/Plan: 22 y.o. G2P1001 128w3d here for IOL due to PD.  Labor: stage 2 Preeclampsia: N/A Fetal wellbeing: category 1 Pain control: epidural I/D: neg Anticipated MOD: continue expectant management, anticipate SVD  Durward Parcelavid McMullen, DO, PGY-2 12/08/2017, 11:03 PM

## 2017-12-09 ENCOUNTER — Encounter (HOSPITAL_COMMUNITY): Payer: Self-pay

## 2017-12-09 DIAGNOSIS — O480 Post-term pregnancy: Secondary | ICD-10-CM

## 2017-12-09 DIAGNOSIS — Z3A40 40 weeks gestation of pregnancy: Secondary | ICD-10-CM

## 2017-12-09 MED ORDER — ONDANSETRON HCL 4 MG/2ML IJ SOLN: 4.0000 mg | INTRAMUSCULAR | Status: DC | PRN

## 2017-12-09 MED ORDER — PRENATAL MULTIVITAMIN CH
1.0000 | ORAL_TABLET | Freq: Every day | ORAL | Status: DC
  Administered 2017-12-09: 1 via ORAL
  Filled 2017-12-09 (×2): qty 1

## 2017-12-09 MED ORDER — WITCH HAZEL-GLYCERIN EX PADS: 1.0000 "application " | MEDICATED_PAD | CUTANEOUS | Status: DC | PRN

## 2017-12-09 MED ORDER — ACETAMINOPHEN 325 MG PO TABS
650.0000 mg | ORAL_TABLET | ORAL | Status: DC | PRN
  Administered 2017-12-09 – 2017-12-10 (×3): 650 mg via ORAL
  Filled 2017-12-09 (×3): qty 2

## 2017-12-09 MED ORDER — COCONUT OIL OIL
1.0000 "application " | TOPICAL_OIL | Status: DC | PRN
  Filled 2017-12-09: qty 120

## 2017-12-09 MED ORDER — OXYCODONE HCL 5 MG PO TABS
10.0000 mg | ORAL_TABLET | Freq: Four times a day (QID) | ORAL | Status: DC | PRN
  Administered 2017-12-09 – 2017-12-10 (×3): 10 mg via ORAL
  Filled 2017-12-09 (×5): qty 2

## 2017-12-09 MED ORDER — SIMETHICONE 80 MG PO CHEW: 80.0000 mg | CHEWABLE_TABLET | ORAL | Status: DC | PRN

## 2017-12-09 MED ORDER — BENZOCAINE-MENTHOL 20-0.5 % EX AERO
1.0000 "application " | INHALATION_SPRAY | CUTANEOUS | Status: DC | PRN
  Administered 2017-12-09: 1 via TOPICAL
  Filled 2017-12-09 (×2): qty 56

## 2017-12-09 MED ORDER — IBUPROFEN 600 MG PO TABS
600.0000 mg | ORAL_TABLET | Freq: Four times a day (QID) | ORAL | Status: DC
  Administered 2017-12-09 – 2017-12-10 (×6): 600 mg via ORAL
  Filled 2017-12-09 (×6): qty 1

## 2017-12-09 MED ORDER — DIPHENHYDRAMINE HCL 25 MG PO CAPS: 25.0000 mg | ORAL_CAPSULE | Freq: Four times a day (QID) | ORAL | Status: DC | PRN

## 2017-12-09 MED ORDER — SENNOSIDES-DOCUSATE SODIUM 8.6-50 MG PO TABS
2.0000 | ORAL_TABLET | ORAL | Status: DC
  Administered 2017-12-09: 2 via ORAL
  Filled 2017-12-09 (×2): qty 2

## 2017-12-09 MED ORDER — TETANUS-DIPHTH-ACELL PERTUSSIS 5-2.5-18.5 LF-MCG/0.5 IM SUSP: 0.5000 mL | Freq: Once | INTRAMUSCULAR | Status: DC

## 2017-12-09 MED ORDER — ONDANSETRON HCL 4 MG PO TABS: 4.0000 mg | ORAL_TABLET | ORAL | Status: DC | PRN

## 2017-12-09 MED ORDER — ZOLPIDEM TARTRATE 5 MG PO TABS: 5.0000 mg | ORAL_TABLET | Freq: Every evening | ORAL | Status: DC | PRN

## 2017-12-09 MED ORDER — DIBUCAINE 1 % RE OINT
1.0000 "application " | TOPICAL_OINTMENT | RECTAL | Status: DC | PRN
  Filled 2017-12-09: qty 28

## 2017-12-09 NOTE — Anesthesia Postprocedure Evaluation (Signed)
Anesthesia Post Note  Patient: Tara Gonzalez  Procedure(s) Performed: AN AD HOC LABOR EPIDURAL     Patient location during evaluation: Mother Baby Anesthesia Type: Epidural Level of consciousness: awake and alert and oriented Pain management: satisfactory to patient Vital Signs Assessment: post-procedure vital signs reviewed and stable Respiratory status: respiratory function stable Cardiovascular status: stable Postop Assessment: no headache, no backache, epidural receding, patient able to bend at knees, no signs of nausea or vomiting and adequate PO intake Anesthetic complications: no    Last Vitals:  Vitals:   12/09/17 0238 12/09/17 0608  BP: (!) 118/54 (!) 112/52  Pulse: 86 74  Resp: 18 18  Temp: 37.1 C 36.8 C  SpO2:      Last Pain:  Vitals:   12/09/17 0608  TempSrc: Oral  PainSc: 8    Pain Goal:                 Jahmez Bily

## 2017-12-09 NOTE — Progress Notes (Signed)
Mother of baby was referred for history of depression and anxiety. Referral screened out by CSW because per chart review, patient's diagnosis of bipolar was greater than three years ago. Patient formerly on Risperdal, but is not on any psychotropic medications at this time. Per prenatal record, patient had stable mood throughout pregnancy and consistently denied suicidal and homicidal ideations.   Please contact CSW if mother of baby requests, if needs arise, or if mother of baby scores greater than a nine or answers yes to question ten on [place] Postpartum Depression Screen.  Edwin Dadaarol Faraaz Wolin, MSW, LCSW-A Clinical Social Worker [facility] [phone]

## 2017-12-09 NOTE — Progress Notes (Addendum)
Rn reviewed admission paperwork and discussed [place] depression information. Mom stated she would like to sign admission paperwork in am.

## 2017-12-09 NOTE — Progress Notes (Signed)
Post Partum Day 1 Subjective: up ad lib, voiding, tolerating PO, + flatus and abdominal and perineal pain, only slighly improved after ibuprofen  Objective: Blood pressure (!) 112/52, pulse 74, temperature 98.3 F (36.8 C), temperature source Oral, resp. rate 18, height 5\' 3"  (1.6 m), weight 178 lb 1.3 oz (80.8 kg), last menstrual period 02/28/2017, SpO2 100 %, unknown if currently breastfeeding.  Physical Exam:  General: alert, cooperative and no distress Lochia: appropriate Uterine Fundus: firm Incision: n/a DVT Evaluation: No evidence of DVT seen on physical exam.  Recent Labs    12/08/17 0757  HGB 10.5*  HCT 32.8*    Assessment/Plan: Plan for discharge tomorrow, Breastfeeding and Contraception Nexplanon prior to discharge  Add oxycodone 10 mg Q 6 hours PRN for breakthrough pain after ibuprofen and Tylenol are given   LOS: 1 day   Sharen CounterLisa Leftwich-Kirby 12/09/2017, 6:22 AM

## 2017-12-10 ENCOUNTER — Other Ambulatory Visit: Payer: Self-pay

## 2017-12-10 NOTE — Progress Notes (Signed)
D/c instructions reviewed, signed & given.  Pt deny ques/concerns at this time. To be d/c home in stable condition with sig other.

## 2017-12-10 NOTE — Discharge Summary (Addendum)
OB Discharge Summary     Patient Name: Tara Gonzalez DOB: 03/21/1996 MRN: [id number]  Date of admission: 12/08/2017 Delivering MD: Wendee BeaversMCMULLEN, DAVID J   Date of discharge: 12/10/2017  Admitting diagnosis: INDUCTION Intrauterine pregnancy: 1190w3d     Secondary diagnosis:  Active Problems:   Indication for care in labor or delivery   Spontaneous vaginal delivery   Shoulder dystocia, delivered  Additional problems: see above     Discharge diagnosis: Term Pregnancy Delivered                                                                                                Post partum procedures:none  Augmentation: AROM, Pitocin and Cytotec  Complications: Shoulder dystocia delivery lasting 1 minute  Hospital course:  Induction of Labor With Vaginal Delivery   22 y.o. yo G2P2002 at 4590w3d was admitted to the hospital 12/08/2017 for induction of labor.  Indication for induction: Postdates.  Patient had an uncomplicated labor course as follows: Membrane Rupture Time/Date: 8:50 PM ,12/08/2017   Intrapartum Procedures: Episiotomy: None [1]                                         Lacerations:  1st degree [2];Labial [10]  Patient had delivery of a Viable infant.  Information for the patient's newborn:  [name], Boy Esmae [[id number]][[id number]]  Delivery Method: Vag-Spont   12/08/2017  Details of delivery can be found in separate delivery note.  Patient had a routine postpartum course. Patient is discharged home 12/10/17.  Physical exam  Vitals:   12/09/17 1315 12/09/17 1445 12/09/17 1900 12/10/17 0516  BP: 116/67  123/79 121/72  Pulse: 80  80 83  Resp: 18  20 18   Temp: (!) 100.8 F (38.2 C) 99 F (37.2 C) 98.6 F (37 C) 98 F (36.7 C)  TempSrc: Oral Oral Oral Oral  SpO2:      Weight:      Height:       General: alert and no distress Lochia: appropriate Uterine Fundus: firm DVT Evaluation: No evidence of DVT seen on physical exam. Labs: Lab Results  Component Value Date   WBC 13.5 (H) 12/08/2017    HGB 10.5 (L) 12/08/2017   HCT 32.8 (L) 12/08/2017   MCV 91.9 12/08/2017   PLT 160 12/08/2017   CMP Latest Ref Rng & Units 06/19/2017  Glucose 65 - 99 mg/dL 562(Z100(H)  BUN 6 - 20 mg/dL 14  Creatinine 3.080.44 - 6.571.00 mg/dL 8.460.53  Sodium 962135 - 952145 mmol/L 136  Potassium 3.5 - 5.1 mmol/L 3.3(L)  Chloride 101 - 111 mmol/L 97(L)  CO2 22 - 32 mmol/L 28  Calcium 8.9 - 10.3 mg/dL 9.7  Total Protein 6.5 - 8.1 g/dL 7.3  Total Bilirubin 0.3 - 1.2 mg/dL 0.9  Alkaline Phos 38 - 126 U/L 64  AST 15 - 41 U/L 34  ALT 14 - 54 U/L 52    Discharge instruction: per After Visit Summary and "Baby and Me Booklet".  After visit meds:  Allergies as of 12/10/2017   No Known Allergies     Medication List    STOP taking these medications   ondansetron 8 MG disintegrating tablet Commonly known as:  ZOFRAN-ODT   prenatal vitamin w/FE, FA 29-1 MG Chew chewable tablet       Diet: routine diet  Activity: Advance as tolerated. Pelvic rest for 6 weeks.   Outpatient follow up:2 weeks Follow up Appt: Future Appointments  Date Time Provider Department Center  12/25/2017  1:30 PM Casey Burkitt, MD [facility]   Follow up Visit:No follow-ups on file.  Postpartum contraception: Nexplanon  Newborn Data: Live born female  Birth Weight: 7 lb 12.2 oz (3521 g) APGAR: 5, 9  Newborn Delivery   Birth date/time:  12/08/2017 23:23:00 Delivery type:  Vaginal, Spontaneous     Baby Feeding: Bottle and Breast Disposition:home with mother   12/10/2017 Wendee Beavers, DO  I assessed this pt and agree with the above assessment

## 2017-12-10 NOTE — Discharge Instructions (Signed)
Vaginal Delivery, Care After °Refer to this sheet in the next few weeks. These instructions provide you with information about caring for yourself after vaginal delivery. Your health care provider may also give you more specific instructions. Your treatment has been planned according to current medical practices, but problems sometimes occur. Call your health care provider if you have any problems or questions. °What can I expect after the procedure? °After vaginal delivery, it is common to have: °· Some bleeding from your vagina. °· Soreness in your abdomen, your vagina, and the area of skin between your vaginal opening and your anus (perineum). °· Pelvic cramps. °· Fatigue. ° °Follow these instructions at home: °Medicines °· Take over-the-counter and prescription medicines only as told by your health care provider. °· If you were prescribed an antibiotic medicine, take it as told by your health care provider. Do not stop taking the antibiotic until it is finished. °Driving ° °· Do not drive or operate heavy machinery while taking prescription pain medicine. °· Do not drive for 24 hours if you received a sedative. °Lifestyle °· Do not drink alcohol. This is especially important if you are breastfeeding or taking medicine to relieve pain. °· Do not use tobacco products, including cigarettes, chewing tobacco, or e-cigarettes. If you need help quitting, ask your health care provider. °Eating and drinking °· Drink at least 8 eight-ounce glasses of water every day unless you are told not to by your health care provider. If you choose to breastfeed your baby, you may need to drink more water than this. °· Eat high-fiber foods every day. These foods may help prevent or relieve constipation. High-fiber foods include: °? Whole grain cereals and breads. °? Brown rice. °? Beans. °? Fresh fruits and vegetables. °Activity °· Return to your normal activities as told by your health care provider. Ask your health care provider  what activities are safe for you. °· Rest as much as possible. Try to rest or take a nap when your baby is sleeping. °· Do not lift anything that is heavier than your baby or 10 lb (4.5 kg) until your health care provider says that it is safe. °· Talk with your health care provider about when you can engage in sexual activity. This may depend on your: °? Risk of infection. °? Rate of healing. °? Comfort and desire to engage in sexual activity. °Vaginal Care °· If you have an episiotomy or a vaginal tear, check the area every day for signs of infection. Check for: °? More redness, swelling, or pain. °? More fluid or blood. °? Warmth. °? Pus or a bad smell. °· Do not use tampons or douches until your health care provider says this is safe. °· Watch for any blood clots that may pass from your vagina. These may look like clumps of dark red, brown, or black discharge. °General instructions °· Keep your perineum clean and dry as told by your health care provider. °· Wear loose, comfortable clothing. °· Wipe from front to back when you use the toilet. °· Ask your health care provider if you can shower or take a bath. If you had an episiotomy or a perineal tear during labor and delivery, your health care provider may tell you not to take baths for a certain length of time. °· Wear a bra that supports your breasts and fits you well. °· If possible, have someone help you with household activities and help care for your baby for at least a few days after   you leave the hospital. °· Keep all follow-up visits for you and your baby as told by your health care provider. This is important. °Contact a health care provider if: °· You have: °? Vaginal discharge that has a bad smell. °? Difficulty urinating. °? Pain when urinating. °? A sudden increase or decrease in the frequency of your bowel movements. °? More redness, swelling, or pain around your episiotomy or vaginal tear. °? More fluid or blood coming from your episiotomy or  vaginal tear. °? Pus or a bad smell coming from your episiotomy or vaginal tear. °? A fever. °? A rash. °? Little or no interest in activities you used to enjoy. °? Questions about caring for yourself or your baby. °· Your episiotomy or vaginal tear feels warm to the touch. °· Your episiotomy or vaginal tear is separating or does not appear to be healing. °· Your breasts are painful, hard, or turn red. °· You feel unusually sad or worried. °· You feel nauseous or you vomit. °· You pass large blood clots from your vagina. If you pass a blood clot from your vagina, save it to show to your health care provider. Do not flush blood clots down the toilet without having your health care provider look at them. °· You urinate more than usual. °· You are dizzy or light-headed. °· You have not breastfed at all and you have not had a menstrual period for 12 weeks after delivery. °· You have stopped breastfeeding and you have not had a menstrual period for 12 weeks after you stopped breastfeeding. °Get help right away if: °· You have: °? Pain that does not go away or does not get better with medicine. °? Chest pain. °? Difficulty breathing. °? Blurred vision or spots in your vision. °? Thoughts about hurting yourself or your baby. °· You develop pain in your abdomen or in one of your legs. °· You develop a severe headache. °· You faint. °· You bleed from your vagina so much that you fill two sanitary pads in one hour. °This information is not intended to replace advice given to you by your health care provider. Make sure you discuss any questions you have with your health care provider. °Document Released: 08/19/2000 Document Revised: 02/03/2016 Document Reviewed: 09/06/2015 °Elsevier Interactive Patient Education © 2018 [employer] ° °

## 2017-12-25 ENCOUNTER — Ambulatory Visit: Payer: Medicaid Other | Admitting: Internal Medicine

## 2018-01-02 ENCOUNTER — Encounter: Payer: Self-pay | Admitting: Internal Medicine

## 2018-01-02 ENCOUNTER — Ambulatory Visit (INDEPENDENT_AMBULATORY_CARE_PROVIDER_SITE_OTHER): Payer: Medicaid Other | Admitting: Internal Medicine

## 2018-01-02 DIAGNOSIS — Z3202 Encounter for pregnancy test, result negative: Secondary | ICD-10-CM | POA: Diagnosis not present

## 2018-01-02 DIAGNOSIS — Z30011 Encounter for initial prescription of contraceptive pills: Secondary | ICD-10-CM

## 2018-01-02 LAB — POCT URINE PREGNANCY: Preg Test, Ur: NEGATIVE

## 2018-01-02 MED ORDER — NORGESTIMATE-ETH ESTRADIOL 0.25-35 MG-MCG PO TABS: 1.0000 | ORAL_TABLET | Freq: Every day | ORAL | 11 refills | Status: DC

## 2018-01-02 MED ORDER — POLYETHYLENE GLYCOL 3350 17 GM/SCOOP PO POWD: ORAL | 1 refills | Status: DC

## 2018-01-02 NOTE — Patient Instructions (Signed)
Ms. Mccarrell,  Please take miralax, 1 capful daily, for constipation.   Start sprintec daily for birth control. As best you can, take this around the same time everyday.  Best, Dr. Sampson Goon

## 2018-01-03 ENCOUNTER — Encounter: Payer: Self-pay | Admitting: Internal Medicine

## 2018-01-03 DIAGNOSIS — Z30011 Encounter for initial prescription of contraceptive pills: Secondary | ICD-10-CM | POA: Insufficient documentation

## 2018-01-03 NOTE — Progress Notes (Signed)
Subjective:     Tara Gonzalez is a 22 y.o. female who presents for a postpartum visit. She is 3 weeks postpartum following a spontaneous vaginal delivery. I have fully reviewed the prenatal and intrapartum course. The delivery was at [redacted]w[redacted]d gestational weeks; she was induced for post dates. Outcome: spontaneous vaginal delivery; did have shoulder dystocia with delivery but able to deliver after McRoberts, suprapubic pressure and rotation of posterior shoulder.  Anesthesia: epidural. She had a 1st degree tear and labial tear that was repaired. She reports good healing and that stitches have dissolved. Postpartum course has been normal. Baby's course has been normal. Baby is feeding by bottle with formula; mother tried breastfeeding but did not want to continue. Bleeding pink and light (less than a period). Bowel function is abnormal--having constipation with hard stools. Bladder function is normal. Patient is not sexually active. Contraception method is initially was thinking nexplanon but would like to retry OCPs (previously had difficulty remembering to take). Postpartum depression screening: negative (score of 6, Q10 negative).   The following portions of the patient's history were reviewed and updated as appropriate: allergies, current medications, past medical history, past social history and problem list. No history of blood clots, migraines, or smoking.   Review of Systems Pertinent items are noted in HPI.   Objective:    BP 100/60 (BP Location: Right Arm, Patient Position: Sitting, Cuff Size: Normal)   Pulse 91   Temp 97.9 F (36.6 C) (Oral)   Wt 161 lb 9.6 oz (73.3 kg)   LMP 02/28/2017   SpO2 98%   BMI 28.63 kg/m   General:  cooperative, appears stated age and no distress  Lungs: clear to auscultation bilaterally  Heart:  regular rate and rhythm, S1, S2 normal, no murmur, click, rub or gallop  Abdomen: soft, non-tender; bowel sounds normal; no masses,  no organomegaly  Vagina: not  evaluated (declined)        Assessment:    Patient doing well after SVD. Does have constipation. Would like to start birth control.   Plan:    1. Contraception: OCP (estrogen/progesterone); u preg negative 2. Constipation: Recommended drinking plenty of water and ordered miralax.  3. Follow up in: as needed.    Dani Gobble, MD [facility], PGY-3

## 2018-02-14 ENCOUNTER — Ambulatory Visit (INDEPENDENT_AMBULATORY_CARE_PROVIDER_SITE_OTHER): Payer: Medicaid Other | Admitting: Internal Medicine

## 2018-02-14 ENCOUNTER — Other Ambulatory Visit: Payer: Self-pay

## 2018-02-14 ENCOUNTER — Encounter: Payer: Self-pay | Admitting: Internal Medicine

## 2018-02-14 VITALS — BP 90/60 | HR 84 | Temp 98.2°F | Ht 61.0 in | Wt 157.0 lb

## 2018-02-14 DIAGNOSIS — Z3202 Encounter for pregnancy test, result negative: Secondary | ICD-10-CM

## 2018-02-14 DIAGNOSIS — N912 Amenorrhea, unspecified: Secondary | ICD-10-CM | POA: Diagnosis not present

## 2018-02-14 LAB — POCT URINE PREGNANCY: Preg Test, Ur: NEGATIVE

## 2018-02-14 NOTE — Patient Instructions (Signed)
Tara Gonzalez,  Please return on Friday for nexplanon insertion.  Below is some information about the device.  Best, Dr. Sampson Goon  Etonogestrel implant What is this medicine? ETONOGESTREL (et oh noe JES trel) is a contraceptive (birth control) device. It is used to prevent pregnancy. It can be used for up to 3 years. This medicine may be used for other purposes; ask your health care provider or pharmacist if you have questions. COMMON BRAND NAME(S): Implanon, Nexplanon What should I tell my health care provider before I take this medicine? They need to know if you have any of these conditions: -abnormal vaginal bleeding -blood vessel disease or blood clots -cancer of the breast, cervix, or liver -depression -diabetes -gallbladder disease -headaches -heart disease or recent heart attack -high blood pressure -high cholesterol -kidney disease -liver disease -renal disease -seizures -tobacco smoker -an unusual or allergic reaction to etonogestrel, other hormones, anesthetics or antiseptics, medicines, foods, dyes, or preservatives -pregnant or trying to get pregnant -breast-feeding How should I use this medicine? This device is inserted just under the skin on the inner side of your upper arm by a health care professional. Talk to your pediatrician regarding the use of this medicine in children. Special care may be needed. Overdosage: If you think you have taken too much of this medicine contact a poison control center or emergency room at once. NOTE: This medicine is only for you. Do not share this medicine with others. What if I miss a dose? This does not apply. What may interact with this medicine? Do not take this medicine with any of the following medications: -amprenavir -bosentan -fosamprenavir This medicine may also interact with the following medications: -barbiturate medicines for inducing sleep or treating seizures -certain medicines for fungal infections like  ketoconazole and itraconazole -grapefruit juice -griseofulvin -medicines to treat seizures like carbamazepine, felbamate, oxcarbazepine, phenytoin, topiramate -modafinil -phenylbutazone -rifampin -rufinamide -some medicines to treat HIV infection like atazanavir, indinavir, lopinavir, nelfinavir, tipranavir, ritonavir -St. John's wort This list may not describe all possible interactions. Give your health care provider a list of all the medicines, herbs, non-prescription drugs, or dietary supplements you use. Also tell them if you smoke, drink alcohol, or use illegal drugs. Some items may interact with your medicine. What should I watch for while using this medicine? This product does not protect you against HIV infection (AIDS) or other sexually transmitted diseases. You should be able to feel the implant by pressing your fingertips over the skin where it was inserted. Contact your doctor if you cannot feel the implant, and use a non-hormonal birth control method (such as condoms) until your doctor confirms that the implant is in place. If you feel that the implant may have broken or become bent while in your arm, contact your healthcare provider. What side effects may I notice from receiving this medicine? Side effects that you should report to your doctor or health care professional as soon as possible: -allergic reactions like skin rash, itching or hives, swelling of the face, lips, or tongue -breast lumps -changes in emotions or moods -depressed mood -heavy or prolonged menstrual bleeding -pain, irritation, swelling, or bruising at the insertion site -scar at site of insertion -signs of infection at the insertion site such as fever, and skin redness, pain or discharge -signs of pregnancy -signs and symptoms of a blood clot such as breathing problems; changes in vision; chest pain; severe, sudden headache; pain, swelling, warmth in the leg; trouble speaking; sudden numbness or weakness of  the  face, arm or leg -signs and symptoms of liver injury like dark yellow or brown urine; general ill feeling or flu-like symptoms; light-colored stools; loss of appetite; nausea; right upper belly pain; unusually weak or tired; yellowing of the eyes or skin -unusual vaginal bleeding, discharge -signs and symptoms of a stroke like changes in vision; confusion; trouble speaking or understanding; severe headaches; sudden numbness or weakness of the face, arm or leg; trouble walking; dizziness; loss of balance or coordination Side effects that usually do not require medical attention (report to your doctor or health care professional if they continue or are bothersome): -acne -back pain -breast pain -changes in weight -dizziness -general ill feeling or flu-like symptoms -headache -irregular menstrual bleeding -nausea -sore throat -vaginal irritation or inflammation This list may not describe all possible side effects. Call your doctor for medical advice about side effects. You may report side effects to FDA at 1-800-FDA-1088. Where should I keep my medicine? This drug is given in a hospital or clinic and will not be stored at home. NOTE: This sheet is a summary. It may not cover all possible information. If you have questions about this medicine, talk to your doctor, pharmacist, or health care provider.  2018 Elsevier/Gold Standard (2016-03-10 11:19:22)

## 2018-02-14 NOTE — Progress Notes (Signed)
[facility] Progress [facility]  Subjective:  Tara Gonzalez is a 22 y.o. female who presents for missed period. She gave birth 12/09/17 and had light bleeding until the 2nd week of May. She started sprintec 01/02/18, as she was not breastfeeding, and took consistently for about 2 weeks but then kept forgetting pills. She does not desire to become pregnant again in the near future and is interested in a longer-acting form of birth control. She does not want the depo shot, as she had persistent bleeding with this x 2 months. She would like to know that she is not pregnant. She is interested in having a nexplanon inserted. ROS: No breast tenderness, occasional nausea  Social: Does not smoke  No Known Allergies  Objective: Blood pressure 90/60, pulse 84, temperature 98.2 F (36.8 C), temperature source Oral, height 5\' 1"  (1.549 m), weight 157 lb (71.2 kg), last menstrual period 01/11/2018, SpO2 99 %, not breastfeeding. Body mass index is 29.66 kg/m. Constitutional: Well-appearing female in NAD Neurological: AOx3, no focal deficits. Psychiatric: Normal mood and affect.  Vitals reviewed   Assessment/Plan: Amenorrhea - U preg negative. Counseled on different birth control options. Given bleeding with depo shot, cautioned patient that she may have bleeding with nexplanon and suggested IUD instead. Patient prefers trying nexplanon. Counseled she could have implant removed or try OCP in conjunction with it in case of bleeding.  - Provided handout on nexplanon and discussed procedure.  - Offered to place nexplanon today, but patient needed to leave with delay in u preg resulting (infant in waiting room with family member). Scheduled procedure for the end of this week.   Follow-up 6/14 for nexplanon insertion.  Dani GobbleHillary Akshaj Besancon, MD [facility], PGY-3

## 2018-02-14 NOTE — Progress Notes (Signed)
u

## 2018-02-14 NOTE — Assessment & Plan Note (Signed)
-   U preg negative. Counseled on different birth control options. Given bleeding with depo shot, cautioned patient that she may have bleeding with nexplanon and suggested IUD instead. Patient prefers trying nexplanon. Counseled she could have implant removed or try OCP in conjunction with it in case of bleeding.  - Provided handout on nexplanon and discussed procedure.  - Offered to place nexplanon today, but patient needed to leave with delay in u preg resulting (infant in waiting room with family member). Scheduled procedure for the end of this week.

## 2018-02-16 ENCOUNTER — Ambulatory Visit (INDEPENDENT_AMBULATORY_CARE_PROVIDER_SITE_OTHER): Payer: Medicaid Other | Admitting: Internal Medicine

## 2018-02-16 ENCOUNTER — Other Ambulatory Visit: Payer: Self-pay

## 2018-02-16 ENCOUNTER — Encounter: Payer: Self-pay | Admitting: Internal Medicine

## 2018-02-16 VITALS — BP 116/74 | HR 74 | Temp 98.7°F | Wt 158.0 lb

## 2018-02-16 DIAGNOSIS — Z309 Encounter for contraceptive management, unspecified: Secondary | ICD-10-CM

## 2018-02-16 DIAGNOSIS — Z30017 Encounter for initial prescription of implantable subdermal contraceptive: Secondary | ICD-10-CM

## 2018-02-16 LAB — POCT URINE PREGNANCY: PREG TEST UR: NEGATIVE

## 2018-02-16 MED ORDER — ETONOGESTREL 68 MG ~~LOC~~ IMPL
68.0000 mg | DRUG_IMPLANT | Freq: Once | SUBCUTANEOUS | Status: AC
  Administered 2018-02-16: 68 mg via SUBCUTANEOUS

## 2018-02-16 NOTE — Patient Instructions (Signed)
Miss Marina Goodellerry,  Please use condoms if you have sex in the next 7 days for pregnancy prevention.  Irregular bleeding for the first few months is not uncommon.  Keep a bandaid on the area of insertion for 5 days. You can take the pressure dressing off tomorrow.  If you have redness around the area or any fevers or drainage, please see us back, as this could be sign of infection.  Best, Dr. Sampson GoonFitzgerald

## 2018-02-18 ENCOUNTER — Encounter: Payer: Self-pay | Admitting: Internal Medicine

## 2018-02-18 DIAGNOSIS — Z30017 Encounter for initial prescription of implantable subdermal contraceptive: Secondary | ICD-10-CM | POA: Insufficient documentation

## 2018-02-18 NOTE — Progress Notes (Signed)
[facility] Progress Note  Subjective:  Tara Gonzalez is a 22 y.o. [id number] female here for Nexplanon insertion. No GYN concerns. No pap smear yet as is less than 659 years old.  No other gynecologic concerns. Reports continued bleeding when tried depo shots in the past but would still like to try nexplanon prior to other birth control options like IUD. Could not consistently remember to take OCP when tried recently. Has started to be sexually active again since giving birth in April. ROS: No abdominal pain, no vaginal discharge  No Known Allergies  Social History   Tobacco Use  . Smoking status: Never Smoker  . Smokeless tobacco: Never Used  Substance Use Topics  . Alcohol use: No    Objective: Blood pressure 116/74, pulse 74, temperature 98.7 F (37.1 C), temperature source Oral, weight 158 lb (71.7 kg), last menstrual period 02/28/2017, SpO2 98 %, unknown if currently breastfeeding. Body mass index is 29.85 kg/m. Constitutional: Pleasant, overweight female in NAD Skin: Skin is warm and dry. No rash noted.  Psychiatric: Normal mood and affect.  Vitals reviewed  Assessment/Plan: Nexplanon Insertion Procedure Patient was given informed consent, she signed consent form.  Patient does understand that irregular bleeding is a very common side effect of this medication. She was advised to have backup contraception for one week after placement. Pregnancy test in clinic today was negative.  Appropriate time out taken.  Patient's left arm was prepped and draped in the usual sterile fashion.. The ruler used to measure and mark insertion area.  Patient was prepped with alcohol swab and then injected with 2 ml of 1% lidocaine.  She was prepped with betadine, Nexplanon removed from packaging,  Device confirmed in needle, then inserted full length of needle and withdrawn per handbook instructions. Nexplanon was able to palpated in the patient's arm; patient palpated the insert herself.  There was minimal blood loss.  Patient insertion site covered with guaze and a pressure bandage to reduce any bruising.  The patient tolerated the procedure well and was given post procedure instructions.   Nexplanon insertion - Procedure completed as above. Provided after-care instructions.   Follow-up prn.  Tara GobbleHillary Joyleen Haselton, MD [facility], PGY-3

## 2018-02-18 NOTE — Assessment & Plan Note (Signed)
-   Procedure completed as above. Provided after-care instructions.

## 2018-04-20 ENCOUNTER — Ambulatory Visit: Payer: Medicaid Other | Admitting: Family Medicine

## 2018-05-15 ENCOUNTER — Ambulatory Visit (INDEPENDENT_AMBULATORY_CARE_PROVIDER_SITE_OTHER): Payer: Medicaid Other | Admitting: Family Medicine

## 2018-05-15 ENCOUNTER — Other Ambulatory Visit: Payer: Self-pay

## 2018-05-15 ENCOUNTER — Encounter: Payer: Self-pay | Admitting: Family Medicine

## 2018-05-15 VITALS — BP 110/70 | HR 60 | Temp 98.5°F | Ht 61.0 in | Wt 158.0 lb

## 2018-05-15 DIAGNOSIS — N939 Abnormal uterine and vaginal bleeding, unspecified: Secondary | ICD-10-CM

## 2018-05-15 MED ORDER — NORGESTIMATE-ETH ESTRADIOL 0.25-35 MG-MCG PO TABS: 1.0000 | ORAL_TABLET | Freq: Every day | ORAL | 3 refills | Status: DC

## 2018-05-16 NOTE — Progress Notes (Signed)
    CHIEF COMPLAINT / HPI: Vaginal bleeding most days of the month.  Since placement of her Nexplanon she is only had 10 days which did not have vaginal bleeding is quite frustrated by this.  REVIEW OF SYSTEMS: No shortness of breath, no unusual fatigue.  See HPI.  PERTINENT  PMH / PSH: I have reviewed the patient's medications, allergies, past medical and surgical history, smoking status and updated in the EMR as appropriate.   OBJECTIVE: General: Well-developed female no acute distress Psych: Alert and oriented x4.  Affect is interactive.  Speech is normal fluency and content.  Judgment is normal.  Recent and remote memory is intact.  ASSESSMENT / PLAN:  Vaginal bleeding on Nexplanon.  Will do 3 months of the addition of oral contraceptives.  See her back at that time.

## 2018-06-08 ENCOUNTER — Ambulatory Visit: Payer: Medicaid Other | Admitting: Family Medicine

## 2018-06-22 ENCOUNTER — Ambulatory Visit: Payer: Medicaid Other

## 2018-10-03 ENCOUNTER — Ambulatory Visit: Payer: Medicaid Other | Admitting: Family Medicine

## 2019-01-26 ENCOUNTER — Emergency Department (HOSPITAL_COMMUNITY)
Admission: EM | Admit: 2019-01-26 | Discharge: 2019-01-26 | Disposition: A | Discharge status: Home / Self Care | Payer: Medicaid Other | Attending: Emergency Medicine | Admitting: Emergency Medicine

## 2019-01-26 ENCOUNTER — Emergency Department (HOSPITAL_COMMUNITY): Payer: Medicaid Other

## 2019-01-26 ENCOUNTER — Other Ambulatory Visit: Payer: Self-pay

## 2019-01-26 ENCOUNTER — Encounter (HOSPITAL_COMMUNITY): Payer: Self-pay

## 2019-01-26 DIAGNOSIS — R1084 Generalized abdominal pain: Secondary | ICD-10-CM | POA: Diagnosis not present

## 2019-01-26 DIAGNOSIS — R112 Nausea with vomiting, unspecified: Secondary | ICD-10-CM | POA: Diagnosis not present

## 2019-01-26 DIAGNOSIS — R1110 Vomiting, unspecified: Secondary | ICD-10-CM | POA: Diagnosis not present

## 2019-01-26 DIAGNOSIS — E876 Hypokalemia: Secondary | ICD-10-CM

## 2019-01-26 DIAGNOSIS — R064 Hyperventilation: Secondary | ICD-10-CM | POA: Diagnosis not present

## 2019-01-26 DIAGNOSIS — R1010 Upper abdominal pain, unspecified: Secondary | ICD-10-CM | POA: Diagnosis not present

## 2019-01-26 DIAGNOSIS — Z79899 Other long term (current) drug therapy: Secondary | ICD-10-CM | POA: Diagnosis not present

## 2019-01-26 DIAGNOSIS — R079 Chest pain, unspecified: Secondary | ICD-10-CM | POA: Diagnosis not present

## 2019-01-26 DIAGNOSIS — K8020 Calculus of gallbladder without cholecystitis without obstruction: Secondary | ICD-10-CM | POA: Diagnosis not present

## 2019-01-26 DIAGNOSIS — R0902 Hypoxemia: Secondary | ICD-10-CM | POA: Diagnosis not present

## 2019-01-26 DIAGNOSIS — R52 Pain, unspecified: Secondary | ICD-10-CM | POA: Diagnosis not present

## 2019-01-26 LAB — URINALYSIS, ROUTINE W REFLEX MICROSCOPIC
Bacteria, UA: NONE SEEN
Bilirubin Urine: NEGATIVE
Glucose, UA: NEGATIVE mg/dL
Ketones, ur: 80 mg/dL — AB
Leukocytes,Ua: NEGATIVE
Nitrite: NEGATIVE
Protein, ur: 30 mg/dL — AB
Specific Gravity, Urine: 1.03 (ref 1.005–1.030)
pH: 6 (ref 5.0–8.0)

## 2019-01-26 LAB — I-STAT BETA HCG BLOOD, ED (MC, WL, AP ONLY): I-stat hCG, quantitative: <5 m[IU]/mL (ref <5)

## 2019-01-26 LAB — CBC WITH DIFFERENTIAL/PLATELET
Abs Immature Granulocytes: 0.05 10*3/uL (ref 0.00–0.07)
Basophils Absolute: 0 10*3/uL (ref 0.0–0.1)
Basophils Relative: 0 %
Eosinophils Absolute: 0 10*3/uL (ref 0.0–0.5)
Eosinophils Relative: 0 %
HCT: 41.9 % (ref 36.0–46.0)
Hemoglobin: 14.2 g/dL (ref 12.0–15.0)
Immature Granulocytes: 0 %
Lymphocytes Relative: 11 %
Lymphs Abs: 1.5 10*3/uL (ref 0.7–4.0)
MCH: 30.5 pg (ref 26.0–34.0)
MCHC: 33.9 g/dL (ref 30.0–36.0)
MCV: 90.1 fL (ref 80.0–100.0)
Monocytes Absolute: 0.8 10*3/uL (ref 0.1–1.0)
Monocytes Relative: 7 %
Neutro Abs: 10.5 10*3/uL — ABNORMAL HIGH (ref 1.7–7.7)
Neutrophils Relative %: 82 %
Platelets: 238 10*3/uL (ref 150–400)
RBC: 4.65 MIL/uL (ref 3.87–5.11)
RDW: 13.5 % (ref 11.5–15.5)
WBC: 12.9 10*3/uL — ABNORMAL HIGH (ref 4.0–10.5)
nRBC: 0 % (ref 0.0–0.2)

## 2019-01-26 LAB — COMPREHENSIVE METABOLIC PANEL
ALT: 31 U/L (ref 0–44)
AST: 22 U/L (ref 15–41)
Albumin: 5.3 g/dL — ABNORMAL HIGH (ref 3.5–5.0)
Alkaline Phosphatase: 76 U/L (ref 38–126)
Anion gap: 18 — ABNORMAL HIGH (ref 5–15)
BUN: 19 mg/dL (ref 6–20)
CO2: 17 mmol/L — ABNORMAL LOW (ref 22–32)
Calcium: 9.9 mg/dL (ref 8.9–10.3)
Chloride: 104 mmol/L (ref 98–111)
Creatinine, Ser: 0.83 mg/dL (ref 0.44–1.00)
GFR calc Af Amer: >60 mL/min (ref >60)
GFR calc non Af Amer: >60 mL/min (ref >60)
Glucose, Bld: 103 mg/dL — ABNORMAL HIGH (ref 70–99)
Potassium: 2.9 mmol/L — ABNORMAL LOW (ref 3.5–5.1)
Sodium: 139 mmol/L (ref 135–145)
Total Bilirubin: 0.9 mg/dL (ref 0.3–1.2)
Total Protein: 8.8 g/dL — ABNORMAL HIGH (ref 6.5–8.1)

## 2019-01-26 LAB — MAGNESIUM: Magnesium: 1.9 mg/dL (ref 1.7–2.4)

## 2019-01-26 LAB — LIPASE, BLOOD: Lipase: 20 U/L (ref 11–51)

## 2019-01-26 MED ORDER — POTASSIUM CHLORIDE 20 MEQ/15ML (10%) PO SOLN
40.0000 meq | Freq: Once | ORAL | Status: DC
  Filled 2019-01-26: qty 30

## 2019-01-26 MED ORDER — POTASSIUM CHLORIDE 10 MEQ/100ML IV SOLN
10.0000 meq | Freq: Once | INTRAVENOUS | Status: AC
  Administered 2019-01-26: 20:00:00 10 meq via INTRAVENOUS
  Filled 2019-01-26: qty 100

## 2019-01-26 MED ORDER — POTASSIUM CHLORIDE CRYS ER 20 MEQ PO TBCR: 20.0000 meq | EXTENDED_RELEASE_TABLET | Freq: Two times a day (BID) | ORAL | 0 refills | Status: DC

## 2019-01-26 MED ORDER — ONDANSETRON HCL 4 MG/2ML IJ SOLN
4.0000 mg | Freq: Once | INTRAMUSCULAR | Status: AC
  Administered 2019-01-26: 4 mg via INTRAVENOUS
  Filled 2019-01-26: qty 2

## 2019-01-26 MED ORDER — ONDANSETRON 4 MG PO TBDP: 4.0000 mg | ORAL_TABLET | Freq: Three times a day (TID) | ORAL | 0 refills | Status: DC | PRN

## 2019-01-26 MED ORDER — POTASSIUM CHLORIDE CRYS ER 20 MEQ PO TBCR
40.0000 meq | EXTENDED_RELEASE_TABLET | Freq: Once | ORAL | Status: AC
  Administered 2019-01-26: 40 meq via ORAL
  Filled 2019-01-26: qty 2

## 2019-01-26 MED ORDER — LACTATED RINGERS IV BOLUS
1000.0000 mL | Freq: Once | INTRAVENOUS | Status: AC
  Administered 2019-01-26: 22:00:00 1000 mL via INTRAVENOUS

## 2019-01-26 MED ORDER — HYDROCODONE-ACETAMINOPHEN 5-325 MG PO TABS: 1.0000 | ORAL_TABLET | ORAL | 0 refills | Status: DC | PRN

## 2019-01-26 MED ORDER — LACTATED RINGERS IV BOLUS
1000.0000 mL | Freq: Once | INTRAVENOUS | Status: AC
  Administered 2019-01-26: 19:00:00 1000 mL via INTRAVENOUS

## 2019-01-26 MED ORDER — PROMETHAZINE HCL 25 MG/ML IJ SOLN
25.0000 mg | Freq: Once | INTRAMUSCULAR | Status: AC
  Administered 2019-01-26: 21:00:00 25 mg via INTRAVENOUS
  Filled 2019-01-26: qty 1

## 2019-01-26 MED ORDER — MORPHINE SULFATE (PF) 4 MG/ML IV SOLN
4.0000 mg | Freq: Once | INTRAVENOUS | Status: AC
  Administered 2019-01-26: 19:00:00 4 mg via INTRAVENOUS
  Filled 2019-01-26: qty 1

## 2019-01-26 MED ORDER — PROMETHAZINE HCL 25 MG PO TABS: 25.0000 mg | ORAL_TABLET | Freq: Four times a day (QID) | ORAL | 0 refills | Status: DC | PRN

## 2019-01-26 NOTE — ED Notes (Signed)
Bed: WA06 Expected date:  Expected time:  Means of arrival:  Comments: 23 yo N/V

## 2019-01-26 NOTE — ED Notes (Signed)
Patient vomited after taking oral potassium. EDP made aware.

## 2019-01-26 NOTE — ED Notes (Signed)
Ultrasound at bedside

## 2019-01-26 NOTE — Discharge Instructions (Signed)
If you develop worsening, continued, or recurrent abdominal pain, uncontrolled vomiting, fever, chest or back pain, or any other new/concerning symptoms then return to the ER for evaluation.  

## 2019-01-26 NOTE — ED Triage Notes (Signed)
Patient presenting to ed with c/o nausea and vomiting and abdominal pain started yesterday morning.

## 2019-01-26 NOTE — ED Notes (Signed)
Attempted to administered potassium solution and patient states "You are going to have to give me the pills because I cannot drink anything that is orange, I will throw up." Encouraged patient to try to take medication, patient states "Oh hell no, I am not taking this." EDP made aware.

## 2019-01-26 NOTE — ED Provider Notes (Signed)
[facility]-EMERGENCY DEPT Provider Note   CSN: [id number] Arrival date & time: 01/26/19  1750    History   Chief Complaint No chief complaint on file.   HPI Tara Gonzalez is a 23 y.o. female.     HPI  23 year old female presents with severe abdominal pain and vomiting.  She states the vomiting started yesterday shortly after she got to [place] after driving from [place].  She states that she has been vomiting numerous times.  Since today she is been having abdominal pain in her upper abdomen and across her back.  It is worse with breathing.  Shortly before the ambulance arrived she states it feels like her whole body has been going numb.  She has been having numbness in all 4 extremities and her face.  The pain in her abdomen feels sharp.  The last few episodes of emesis have had small amounts of blood.  No chest pain.  No urinary symptoms.  Past Medical History:  Diagnosis Date  . Anemia    during last pregnancy diagnosed   . Anxiety   . Bipolar disorder (HCC)   . Bipolar disorder (HCC)   . Depression    on going started diagnosed as child. not currently on medication   . Headache   . Pyelonephritis affecting pregnancy in first trimester     Patient Active Problem List   Diagnosis Date Noted  . Nexplanon insertion 02/18/2018  . Spontaneous vaginal delivery 12/09/2017  . Hyperemesis 05/20/2017  . Grief reaction 09/11/2015  . Bipolar disorder (HCC) 05/02/2012    Past Surgical History:  Procedure Laterality Date  . TONSILLECTOMY       OB History    Gravida  2   Para  2   Term  2   Preterm      AB      Living  2     SAB      TAB      Ectopic      Multiple  0   Live Births  2            Home Medications    Prior to Admission medications   Medication Sig Start Date End Date Taking? Authorizing Provider  etonogestrel (NEXPLANON) 68 MG IMPL implant 1 each by Subdermal route once.   Yes [provider]  HYDROcodone-acetaminophen (NORCO) 5-325 MG tablet Take 1 tablet by mouth every 4 (four) hours as needed for severe pain. 01/26/19   Pricilla Loveless, MD  norgestimate-ethinyl estradiol (ORTHO-CYCLEN,SPRINTEC,PREVIFEM) 0.25-35 MG-MCG tablet Take 1 tablet by mouth daily. Patient not taking: Reported on 01/26/2019 05/15/18   Nestor Ramp, MD  ondansetron (ZOFRAN ODT) 4 MG disintegrating tablet Take 1 tablet (4 mg total) by mouth every 8 (eight) hours as needed for nausea or vomiting. 01/26/19   Pricilla Loveless, MD  polyethylene glycol powder (GLYCOLAX/MIRALAX) powder 1 capful daily mixed with beverage of your choice Patient not taking: Reported on 01/26/2019 01/02/18   Casey Burkitt, MD  potassium chloride SA (K-DUR) 20 MEQ tablet Take 1 tablet (20 mEq total) by mouth 2 (two) times daily for 5 days. 01/26/19 01/31/19  Pricilla Loveless, MD  promethazine (PHENERGAN) 25 MG tablet Take 1 tablet (25 mg total) by mouth every 6 (six) hours as needed for nausea or vomiting. 01/26/19   Pricilla Loveless, MD    Family History Family History  Problem Relation Age of Onset  . Hypertension Mother   . Hyperlipidemia Mother   .  Diabetes Maternal Grandmother        type II  . Congenital Murmur Maternal Grandmother        with VSD  . Diabetes Maternal Grandfather        type II  . Asthma Brother   . Autism spectrum disorder Brother     Social History Social History   Tobacco Use  . Smoking status: Never Smoker  . Smokeless tobacco: Never Used  Substance Use Topics  . Alcohol use: No  . Drug use: No     Allergies   Patient has no known allergies.   Review of Systems Review of Systems  Constitutional: Negative for fever.  Respiratory: Negative for shortness of breath.   Cardiovascular: Negative for chest pain.  Gastrointestinal: Positive for abdominal pain, nausea and vomiting.  Genitourinary: Negative for dysuria.  Musculoskeletal: Positive for back pain.  Neurological: Positive for  numbness.  All other systems reviewed and are negative.    Physical Exam Updated Vital Signs BP 124/81 (BP Location: Left Arm)   Pulse (!) 102   Temp 97.8 F (36.6 C) (Oral)   Resp 20   Ht 5\' 2"  (1.575 m)   SpO2 100%   BMI 28.90 kg/m   Physical Exam Vitals signs and nursing note reviewed.  Constitutional:      Appearance: She is well-developed. She is not ill-appearing or diaphoretic.  HENT:     Head: Normocephalic and atraumatic.     Right Ear: External ear normal.     Left Ear: External ear normal.     Nose: Nose normal.  Eyes:     General:        Right eye: No discharge.        Left eye: No discharge.  Cardiovascular:     Rate and Rhythm: Normal rate and regular rhythm.     Heart sounds: Normal heart sounds.  Pulmonary:     Effort: Pulmonary effort is normal.     Breath sounds: Normal breath sounds.  Abdominal:     Palpations: Abdomen is soft.     Tenderness: There is abdominal tenderness in the right upper quadrant, epigastric area and left upper quadrant. There is right CVA tenderness and left CVA tenderness.  Musculoskeletal:     Thoracic back: She exhibits tenderness.       Back:  Skin:    General: Skin is warm and dry.  Neurological:     Mental Status: She is alert.  Psychiatric:        Mood and Affect: Mood is not anxious.      ED Treatments / Results  Labs (all labs ordered are listed, but only abnormal results are displayed) Labs Reviewed  COMPREHENSIVE METABOLIC PANEL - Abnormal; Notable for the following components:      Result Value   Potassium 2.9 (*)    CO2 17 (*)    Glucose, Bld 103 (*)    Total Protein 8.8 (*)    Albumin 5.3 (*)    Anion gap 18 (*)    All other components within normal limits  CBC WITH DIFFERENTIAL/PLATELET - Abnormal; Notable for the following components:   WBC 12.9 (*)    Neutro Abs 10.5 (*)    All other components within normal limits  URINALYSIS, ROUTINE W REFLEX MICROSCOPIC - Abnormal; Notable for the  following components:   Hgb urine dipstick SMALL (*)    Ketones, ur 80 (*)    Protein, ur 30 (*)    All other  components within normal limits  LIPASE, BLOOD  MAGNESIUM  I-STAT BETA HCG BLOOD, ED ([facility], [facility], [facility] ONLY)    EKG EKG Interpretation  Date/Time:  Saturday Jan 26 2019 18:19:36 EDT Ventricular Rate:  71 PR Interval:    QRS Duration: 117 QT Interval:  553 QTC Calculation: 602 R Axis:   81 Text Interpretation:  Sinus rhythm Nonspecific intraventricular conduction delay Confirmed by Pricilla LovelessGoldston, Thai Burgueno [phone]([id number]) on 01/26/2019 6:46:10 PM   Radiology [place] Abdomen Complete  Result Date: 01/26/2019 CLINICAL DATA:  Upper abdominal pain EXAM: ABDOMEN ULTRASOUND COMPLETE COMPARISON:  None. FINDINGS: Gallbladder: Multiple gallstones are noted. There is no gallbladder wall thickening or pericholecystic free fluid. The sonographic Eulah PontMurphy sign is reported as negative. Common bile duct: Diameter: 0.3 cm Liver: No focal lesion identified. Within normal limits in parenchymal echogenicity. Portal vein is patent on color Doppler imaging with normal direction of blood flow towards the liver. IVC: No abnormality visualized. Pancreas: Visualized portion unremarkable. Spleen: Size and appearance within normal limits. Right Kidney: Length: 10.3. Echogenicity within normal limits. No mass or hydronephrosis visualized. Left Kidney: Length: 11.1. Echogenicity within normal limits. No mass or hydronephrosis visualized. Abdominal aorta: No aneurysm visualized. Other findings: None. IMPRESSION: Cholelithiasis without sonographic evidence for acute cholecystitis. Electronically Signed   By: Katherine Mantlehristopher  Green M.D.   On: 01/26/2019 20:24   Dg Chest Portable 1 View  Result Date: 01/26/2019 CLINICAL DATA:  Pain EXAM: PORTABLE CHEST 1 VIEW COMPARISON:  None. FINDINGS: The heart size and mediastinal contours are within normal limits. Both lungs are clear. The visualized skeletal structures are unremarkable. IMPRESSION: No  active disease. Electronically Signed   By: Katherine Mantlehristopher  Green M.D.   On: 01/26/2019 19:01    Procedures Procedures (including critical care time)  Medications Ordered in ED Medications  potassium chloride 20 MEQ/15ML (10%) solution 40 mEq (40 mEq Oral Refused 01/26/19 2248)  ondansetron (ZOFRAN) injection 4 mg (4 mg Intravenous Given 01/26/19 1831)  morphine 4 MG/ML injection 4 mg (4 mg Intravenous Given 01/26/19 1831)  lactated ringers bolus 1,000 mL (0 mLs Intravenous Stopped 01/26/19 1942)  potassium chloride SA (K-DUR) CR tablet 40 mEq (40 mEq Oral Given 01/26/19 2024)  potassium chloride 10 mEq in 100 mL IVPB (0 mEq Intravenous Stopped 01/26/19 2124)  promethazine (PHENERGAN) injection 25 mg (25 mg Intravenous Given 01/26/19 2123)  lactated ringers bolus 1,000 mL (0 mLs Intravenous Stopped 01/26/19 2248)     Initial Impression / Assessment and Plan / ED Course  I have reviewed the triage vital signs and the nursing notes.  Pertinent labs & imaging results that were available during my care of the patient were reviewed by me and considered in my medical decision making (see chart for details).        Patient has diffuse upper abd pain, probably from vomiting given vomiting started first. Has gallstones but no cholecystitis. No fever. No UTI/pyelo. She has chronic hypokalemia, and has a hard time tolerating oral K but more from difficulty with pills/meds rather than vomiting. Refused liquid K due to taste. Eventually could take KDur. Given tolerating PO, will d/c with K and symptom control. I doubt this is cholecystitis. Follow up with gen surgery as outpatient. Small blood probably mallory weiss. Discussed return precautions.  Final Clinical Impressions(s) / ED Diagnoses   Final diagnoses:  Vomiting in adult  Hypokalemia  Calculus of gallbladder without cholecystitis without obstruction    ED Discharge Orders         Ordered  promethazine (PHENERGAN) 25 MG tablet  Every 6 hours  PRN     01/26/19 2313    ondansetron (ZOFRAN ODT) 4 MG disintegrating tablet  Every 8 hours PRN     01/26/19 2313    HYDROcodone-acetaminophen (NORCO) 5-325 MG tablet  Every 4 hours PRN     01/26/19 2313    potassium chloride SA (K-DUR) 20 MEQ tablet  2 times daily     01/26/19 2313           Pricilla Loveless, MD 01/27/19 [phone]

## 2019-01-26 NOTE — ED Notes (Signed)
Urine sample and culture sent to lab 

## 2019-02-15 ENCOUNTER — Encounter: Payer: Self-pay | Admitting: Family Medicine

## 2019-02-15 ENCOUNTER — Other Ambulatory Visit: Payer: Self-pay

## 2019-02-15 ENCOUNTER — Ambulatory Visit (INDEPENDENT_AMBULATORY_CARE_PROVIDER_SITE_OTHER): Payer: Medicaid Other | Admitting: Family Medicine

## 2019-02-15 VITALS — BP 102/64 | HR 64

## 2019-02-15 DIAGNOSIS — Z3046 Encounter for surveillance of implantable subdermal contraceptive: Secondary | ICD-10-CM | POA: Diagnosis not present

## 2019-02-15 DIAGNOSIS — Z3009 Encounter for other general counseling and advice on contraception: Secondary | ICD-10-CM | POA: Insufficient documentation

## 2019-02-15 DIAGNOSIS — F319 Bipolar disorder, unspecified: Secondary | ICD-10-CM | POA: Diagnosis not present

## 2019-02-15 NOTE — Assessment & Plan Note (Signed)
See procedure note above.  Bandage applied to area.  Patient given return precautions and advised to keep area clean and dry today, can use bandage tomorrow and to use neosporin on the area for about 1 week. Advised that she can become pregnant and to use condoms until she decides on birth control.

## 2019-02-15 NOTE — Patient Instructions (Addendum)
Today we took out your nexplanon.  Remember that you can now technically get pregnant.  You should use a condom until you come back for birth control.  If you start to have redness at the site, fevers, chills you should call us and be seen.    Below is information about an IUD.  Make an appointment if you would like to get one and take 600mg  of ibuprofen about 45 minutes before your appointment.   I have placed a referral to Psychiatry.  If you do not hear from them in the next 2 weeks, please give us a call.  It was great to see you!  Best,  Dr. Judie PetitM   Intrauterine Device Information An intrauterine device (IUD) is a medical device that is inserted in the uterus to prevent pregnancy. It is a small, T-shaped device that has one or two nylon strings hanging down from it. The strings hang out of the lower part of the uterus (cervix) to allow for future IUD removal. There are two types of IUDs available:  Hormone IUD. This type of IUD is made of plastic and contains the hormone progestin (synthetic progesterone). A hormone IUD may last 3-5 years.  Copper IUD. This type of IUD has copper wire wrapped around it. A copper IUD may last up to 10 years. How is an IUD inserted? An IUD is inserted through the vagina and placed into the uterus with a minor medical procedure. The exact procedure for IUD insertion may vary among health care providers and hospitals. How does an IUD work? Synthetic progesterone in a hormonal IUD prevents pregnancy by:  Thickening cervical mucus to prevent sperm from entering the uterus.  Thinning the uterine lining to prevent a fertilized egg from being implanted there. Copper in a copper IUD prevents pregnancy by making the uterus and fallopian tubes produce a fluid that kills sperm. What are the advantages of an IUD? Advantages of either type of IUD  It is highly effective in preventing pregnancy.  It is reversible. You can become pregnant shortly after the IUD is  removed.  It is low-maintenance and can stay in place for a long time.  There are no estrogen-related side effects.  It can be used when breastfeeding.  It is not associated with weight gain.  It can be inserted right after childbirth, an abortion, or a miscarriage. Advantages of a hormone IUD  If it is inserted within 7 days of your period starting, it works right after it is inserted. If the hormone IUD is inserted at any other time in your cycle, you will need to use a backup method of birth control for 7 days after insertion.  It can make menstrual periods lighter.  It can reduce menstrual cramping.  It can be used for 3-5 years. Advantages of a copper IUD  It works right after it is inserted.  It can be used as a form of emergency birth control if it is inserted within 5 days after having unprotected sex.  It does not interfere with your body's natural hormones.  It can be used for 10 years. What are the disadvantages of an IUD?  An IUD may cause irregular menstrual bleeding for a period of time after insertion.  You may have pain during insertion and have cramping and vaginal bleeding after insertion.  An IUD may cut the uterus (uterine perforation) when it is inserted. This is rare.  An IUD may cause pelvic inflammatory disease (PID), which is an  infection in the uterus and fallopian tubes. This is rare, and it usually happens during the first 20 days after the IUD is inserted.  A copper IUD can make your menstrual flow heavier and more painful. How is an IUD removed?  You will lie on your back with your knees bent and your feet in footrests (stirrups).  A device will be inserted into your vagina to spread apart the vaginal walls (speculum). This will allow your health care provider to see the strings attached to the IUD.  Your health care provider will use a small instrument (forceps) to grasp the IUD strings and pull firmly until the IUD is removed. You may  have some discomfort when the IUD is removed. Your health care provider may recommend taking over-the-counter pain relievers, such as ibuprofen, before the procedure. You may also have minor spotting for a few days after the procedure. The exact procedure for IUD removal may vary among health care providers and hospitals. Is the IUD right for me? Your health care provider will make sure you are a good candidate for an IUD and will discuss the advantages, disadvantages, and possible side effects with you. Summary  An intrauterine device (IUD) is a medical device that is inserted in the uterus to prevent pregnancy. It is a small, T-shaped device that has one or two nylon strings hanging down from it.  A hormone IUD contains the hormone progestin (synthetic progesterone). A copper IUD has copper wire wrapped around it.  Synthetic progesterone in a hormone IUD prevents pregnancy by thickening cervical mucus and thinning the walls of the uterus. Copper in a copper IUD prevents pregnancy by making the uterus and fallopian tubes produce a fluid that kills sperm.  A hormone IUD can be left in place for 3-5 years. A copper IUD can be left in place for up to 10 years.  An IUD is inserted and removed by a health care provider. You may feel some pain during insertion and removal. Your health care provider may recommend taking over-the-counter pain medicine, such as ibuprofen, before an IUD procedure. This information is not intended to replace advice given to you by your health care provider. Make sure you discuss any questions you have with your health care provider. Document Released: 07/26/2004 Document Revised: 09/20/2016 Document Reviewed: 09/20/2016 Elsevier Interactive Patient Education  2019 [employer].

## 2019-02-15 NOTE — Progress Notes (Signed)
Subjective: Chief Complaint  Patient presents with  . Contraception     HPI: Tara Gonzalez is a 23 y.o. presenting to clinic today to discuss the following:  1 Nexplanon Removal  Patient presents requesting Nexplanon removal.  She states "I have that for a while and I just wanted out.  I've tried everything, I just really don't want it anymore.  I've been bleeding and I feel like the hormones are messing me up."  2 Contraception  Patient inquiring about other contraception types.  Notes that she has used Depo Provera in the past and "didn't like it."  She states that she did not like that her bleeding was unpredictable.  She notes that she does not like to take pills.  She would like to discuss other options.  3 Bipolar  Patient notes that her mother is requesting she have a referral to psychiatry.  She states "I feel like I get angry and I know I have a bunch of issues and should be on medications, but I'm not."  Denies SI/HI.  Does endorse anxiety.         ROS noted in HPI.   Past Medical, Surgical, Social, and Family History Reviewed & Updated per EMR.   Pertinent Historical Findings include:   Social History   Tobacco Use  Smoking Status Never Smoker  Smokeless Tobacco Never Used      Objective: BP 102/64   Pulse 64   LMP 02/12/2019   SpO2 99%   Breastfeeding No  Vitals and nursing notes reviewed  Physical Exam:  General: 23 y.o. female in NAD Lungs: Breathing comfortably on RA Extremities: nexplanon in place in left arm without surrounding erythema, small scar likely 2/2 insertion  PROCEDURE NOTE: [place] Patient given informed consent and signed copy in the chart. Left arm area prepped and draped in the usual sterile fashion. Three cc of lidocaine with epinephrine 1% used for local anesthesia. A small stab incision was made close to the nexplanon with scalpel. Hemostats were used to withdraw the nexplanon. A small bandage was applied over a  steri strip  No complications.Patient given follow up instructions should she experience redness, swelling at sight or fever in the next 24 hours. Patient was reminded this totally removes her nexplanon contraceptive devise. (she can now potentially conceive)   No results found for this or any previous visit (from the past 25 hour(s)).  Assessment/Plan:  Encounter for Nexplanon removal See procedure note above.  Bandage applied to area.  Patient given return precautions and advised to keep area clean and dry today, can use bandage tomorrow and to use neosporin on the area for about 1 week. Advised that she can become pregnant and to use condoms until she decides on birth control.  Encounter for counseling regarding contraception The risks and benefits were dicussed with patient regarding the following forms of birth control: oral contraceptive pills, Depo-Provera shot, Nuva-Ring, hormonal IUD, copper IUD, Nexplanon, and condoms.  The patient decided she will likely schedule an appointment for IUD insertion in 2 weeks.  She was advised to take ibuprofen 400mg  45 minutes prior to this appointment.  She was counseled on possible side effects of this method.  She denies a history of migraines with aura, DVT, PE, and family history of DVT or PE.  Bipolar disorder ([place]) Has been diagnosed with Bipolar Disorder.  Patient also complaining of anxiety.  Per her request, would like to speak to a psychiatrist about these  concerns.  Appears to have been on risperdal previously, but has not been on medications in many years.  Denies SI/HI, no emergent intervention needed. - refer to psychiatry at patient request     PATIENT EDUCATION PROVIDED: See AVS    Diagnosis and plan along with any newly prescribed medication(s) were discussed in detail with this patient today. The patient verbalized understanding and agreed with the plan. Patient advised if symptoms worsen return to clinic or ER.   Health  Maintainance: will need Pap with IUD insertion   Orders Placed This Encounter  Procedures  . Ambulatory referral to Psychiatry    Referral Priority:   Routine    Referral Type:   Psychiatric    Referral Reason:   Specialty Services Required    Requested Specialty:   Psychiatry    Number of Visits Requested:   1    No orders of the defined types were placed in this encounter.    Luis AbedBailey Raivyn Kabler, DO 02/15/2019, 5:11 PM PGY-1 [facility]

## 2019-02-15 NOTE — Assessment & Plan Note (Signed)
The risks and benefits were dicussed with patient regarding the following forms of birth control: oral contraceptive pills, Depo-Provera shot, Nuva-Ring, hormonal IUD, copper IUD, Nexplanon, and condoms.  The patient decided she will likely schedule an appointment for IUD insertion in 2 weeks.  She was advised to take ibuprofen 400mg  45 minutes prior to this appointment.  She was counseled on possible side effects of this method.  She denies a history of migraines with aura, DVT, PE, and family history of DVT or PE.

## 2019-02-15 NOTE — Assessment & Plan Note (Signed)
Has been diagnosed with Bipolar Disorder.  Patient also complaining of anxiety.  Per her request, would like to speak to a psychiatrist about these concerns.  Appears to have been on risperdal previously, but has not been on medications in many years.  Denies SI/HI, no emergent intervention needed. - refer to psychiatry at patient request

## 2019-02-28 ENCOUNTER — Encounter: Payer: Self-pay | Admitting: *Deleted

## 2019-02-28 ENCOUNTER — Ambulatory Visit: Payer: Medicaid Other | Admitting: Family Medicine

## 2019-02-28 NOTE — Progress Notes (Deleted)
    Subjective:    Patient ID: Tara Gonzalez, female    DOB: 1996/06/25, 23 y.o.   MRN: [id number]   CC:  HPI:   Smoking status reviewed  Review of Systems   Objective:  LMP 02/12/2019  Vitals and nursing note reviewed  General: well nourished, in no acute distress HEENT: normocephalic, MMM Abdomen: soft, nontender, nondistended, no masses or organomegaly. Bowel sounds present GU: normal external female genitalia Extremities: no edema or cyanosis.  Neuro: alert and oriented, no focal deficits  IUD Insertion Procedure Note  Pre-operative Diagnosis: ***  Post-operative Diagnosis: {Condition:17813}  Indications: contraception  Procedure Details  Urine pregnancy test {Actions; was done:16410}.  The risks (including infection, bleeding, pain, and uterine perforation) and benefits of the procedure were explained to the patient and {desc; verbal/written:16408} informed consent was obtained.    Cervix cleansed with Betadine. Uterus sounded to {0-10:33138} cm. IUD inserted without difficulty. String visible and trimmed. Patient tolerated procedure well.  IUD Information: {IUD tracking :14606}.  Condition: Stable  Complications: None  Plan:  The patient was advised to call for any fever or for prolonged or severe pain or bleeding. She was advised to use {meds; pain:16413} as needed for mild to moderate pain.   Attending Physician Documentation: {attending attestation:17855}  Assessment & Plan:    No problem-specific Assessment & Plan notes found for this encounter.    No follow-ups on file.   Lucila Maine, DO Family Medicine Resident PGY-3

## 2019-03-01 ENCOUNTER — Other Ambulatory Visit: Payer: Self-pay

## 2019-03-01 ENCOUNTER — Ambulatory Visit: Payer: Medicaid Other | Admitting: Family Medicine

## 2019-03-01 ENCOUNTER — Encounter: Payer: Self-pay | Admitting: Family Medicine

## 2019-03-01 VITALS — Ht 62.0 in | Wt 136.4 lb

## 2019-03-01 DIAGNOSIS — Z3009 Encounter for other general counseling and advice on contraception: Secondary | ICD-10-CM

## 2019-03-01 LAB — POCT URINE PREGNANCY: Preg Test, Ur: NEGATIVE

## 2019-03-01 MED ORDER — MEDROXYPROGESTERONE ACETATE 150 MG/ML IM SUSP
150.0000 mg | Freq: Once | INTRAMUSCULAR | Status: AC
  Administered 2019-03-01: 150 mg via INTRAMUSCULAR

## 2019-03-01 NOTE — Progress Notes (Signed)
    Subjective:  Tara Gonzalez is a 23 y.o. female who presents to the [facility] today for contraception  HPI:  Has previously been on nexplanon but had lots of breakthrough bleeding. She had discussed options with her PBP at prior visit. She would like to try depo provera as she is still considering IUD at this time. She otherwise feels well and has no other concerns.  ROS: Per HPI   CC, SH/smoking status, and VS noted  Objective:  Physical Exam: Ht 5\' 2"  (1.575 m)   Wt 136 lb 6 oz (61.9 kg)   LMP 02/12/2019   BMI 24.94 kg/m   Gen: NAD, resting comfortably Pulm: NWOB Neuro: grossly normal, moves all extremities Psych: Normal affect and thought content  Results for orders placed or performed in visit on 03/01/19 (from the past 72 hour(s))  POCT urine pregnancy     Status: None   Collection Time: 03/01/19  4:13 PM  Result Value Ref Range   Preg Test, Ur Negative Negative     Assessment/Plan:  Encounter for counseling regarding contraception Patient counseled on various forms of birth control. She will start depo provera today and consider IUD un the future.    Orders Placed This Encounter  Procedures  . POCT urine pregnancy    Meds ordered this encounter  Medications  . medroxyPROGESTERone (DEPO-PROVERA) injection 150 mg    Health Maintenance reviewed - Informed patient that due for pap, she will come back for this at her convenience.  Bufford Lope, DO PGY-3, [facility] 03/01/2019 4:16 PM

## 2019-03-01 NOTE — Patient Instructions (Signed)
Depo provera every 3 months.   IUD is a great option.

## 2019-03-01 NOTE — Assessment & Plan Note (Signed)
Patient counseled on various forms of birth control. She will start depo provera today and consider IUD un the future.

## 2019-04-15 ENCOUNTER — Ambulatory Visit: Payer: Medicaid Other | Admitting: Family Medicine

## 2019-05-17 ENCOUNTER — Ambulatory Visit: Payer: Medicaid Other

## 2019-06-04 ENCOUNTER — Ambulatory Visit: Payer: Medicaid Other

## 2019-06-05 ENCOUNTER — Ambulatory Visit (INDEPENDENT_AMBULATORY_CARE_PROVIDER_SITE_OTHER): Payer: Medicaid Other | Admitting: *Deleted

## 2019-06-05 ENCOUNTER — Other Ambulatory Visit: Payer: Self-pay

## 2019-06-05 DIAGNOSIS — Z309 Encounter for contraceptive management, unspecified: Secondary | ICD-10-CM

## 2019-06-05 LAB — POCT URINE PREGNANCY: Preg Test, Ur: NEGATIVE

## 2019-06-05 MED ORDER — MEDROXYPROGESTERONE ACETATE 150 MG/ML IM SUSY
150.0000 mg | PREFILLED_SYRINGE | Freq: Once | INTRAMUSCULAR | Status: AC
  Administered 2019-06-05: 17:00:00 150 mg via INTRAMUSCULAR

## 2019-06-05 NOTE — Progress Notes (Signed)
u

## 2019-06-05 NOTE — Progress Notes (Signed)
Pt is here today for a depo.  She was due by 05/31/19.    Last contraceptive appt was: 02/2019  It has been 1.5 weeks since last unprotected intercourse.  Her upreg today is negative.  Explained protocol and pt has decided to go ahead and get depo today.  Patient instructed to use protection x 1 week and given condoms.  Instructed to return for nurse visit in 2 weeks for repeat pregnancy test per office protocol.  Patient agreeable to plan.  Pt given injection today in her LUOQ, tolerated well.  She is due for next depo 08/21/19 - 09/04/19. Reminder card given.   Christen Bame, CMA

## 2019-06-19 ENCOUNTER — Ambulatory Visit (INDEPENDENT_AMBULATORY_CARE_PROVIDER_SITE_OTHER): Payer: Medicaid Other | Admitting: *Deleted

## 2019-06-19 ENCOUNTER — Other Ambulatory Visit: Payer: Self-pay

## 2019-06-19 DIAGNOSIS — Z309 Encounter for contraceptive management, unspecified: Secondary | ICD-10-CM

## 2019-06-19 LAB — POCT URINE PREGNANCY: Preg Test, Ur: NEGATIVE

## 2019-06-19 NOTE — Progress Notes (Signed)
Pt is here for her repeat upreg after last depo.  She has not had intercourse since last depo injection.  Upreg today is negative.  Pt informed. Christen Bame, CMA

## 2019-09-02 ENCOUNTER — Other Ambulatory Visit: Payer: Self-pay

## 2019-09-02 ENCOUNTER — Ambulatory Visit (INDEPENDENT_AMBULATORY_CARE_PROVIDER_SITE_OTHER): Payer: Medicaid Other

## 2019-09-02 DIAGNOSIS — Z3009 Encounter for other general counseling and advice on contraception: Secondary | ICD-10-CM | POA: Diagnosis not present

## 2019-09-02 MED ORDER — MEDROXYPROGESTERONE ACETATE 150 MG/ML IM SUSP
150.0000 mg | Freq: Once | INTRAMUSCULAR | Status: AC
  Administered 2019-09-02: 150 mg via INTRAMUSCULAR

## 2019-09-02 NOTE — Progress Notes (Signed)
Patient presents in nurse clinic for depo provera injection. Injection given RUOQ, site unremarkable. Patient due back for next injection, 11/18/2019-12/02/2019, reminder card given.

## 2019-09-12 ENCOUNTER — Other Ambulatory Visit (HOSPITAL_COMMUNITY)
Admission: RE | Admit: 2019-09-12 | Discharge: 2019-09-12 | Disposition: A | Discharge status: Home / Self Care | Payer: Medicaid Other | Source: Ambulatory Visit | Attending: Family Medicine | Admitting: Family Medicine

## 2019-09-12 ENCOUNTER — Ambulatory Visit (INDEPENDENT_AMBULATORY_CARE_PROVIDER_SITE_OTHER): Payer: Medicaid Other | Admitting: Family Medicine

## 2019-09-12 ENCOUNTER — Other Ambulatory Visit: Payer: Self-pay

## 2019-09-12 ENCOUNTER — Encounter: Payer: Self-pay | Admitting: Family Medicine

## 2019-09-12 VITALS — BP 122/62 | HR 69 | Ht 62.0 in | Wt 135.4 lb

## 2019-09-12 DIAGNOSIS — Z124 Encounter for screening for malignant neoplasm of cervix: Secondary | ICD-10-CM

## 2019-09-12 DIAGNOSIS — R1030 Lower abdominal pain, unspecified: Secondary | ICD-10-CM | POA: Diagnosis not present

## 2019-09-12 DIAGNOSIS — E876 Hypokalemia: Secondary | ICD-10-CM | POA: Diagnosis not present

## 2019-09-12 LAB — POCT WET PREP (WET MOUNT)
Clue Cells Wet Prep Whiff POC: POSITIVE
Trichomonas Wet Prep HPF POC: ABSENT

## 2019-09-12 NOTE — Progress Notes (Signed)
Subjective: Chief Complaint  Patient presents with  . Referral     HPI: Tara Gonzalez is a 24 y.o. presenting to clinic today to discuss the following:  1 Abdominal Pain Bilateral lower abdominal pain.  Constant pain x 2 monts, prior to that would come and go.  Vomiting in the AM every morning.  Having diarrhea every time she has a BM, but this only happens twice a week.  No blood in stool.  Type 6 on [facility] stool chart, but brown in color.    Was seen in ED on 01/26/2019 for similar pain, but Abd [place] with cholelithiasis without cholecystitis.  Having white/clear discharge, but no odor or itching.  Has not been sexually active in the last month.  Pain with intercourse.    No vaginal bleeding at all with the depo.  No fevers, chills.    Eats twice a day.  Doesn't have much of an appetite.  Doesn't eat fried foods to avoid because she knew that she had gall stones and was worried this was contributing.    Urinating normally.    Pain worse with movement, turning, bending over, tight pants.  "Feels kind of like a contraction."  Rates pain 7/10.  Laying down on her back makes the pain better.  Has tried tylenol and ibuprofen for the pain but they haven't helped.  2 Hypokalemia Patient reports frequent diarrhea.  See above.  Last BMP in May 2020 with potassium of 2.9.  Health Maintenance: Needs first Pap     ROS noted in HPI. Chief complaint noted.  Other Pertinent PMH: Hx bipolar disorder, hyperemesis Past Medical, Surgical, Social, and Family History Reviewed & Updated per EMR.      Social History   Tobacco Use  Smoking Status Never Smoker  Smokeless Tobacco Never Used   Smoking status noted.    Objective: BP 122/62   Pulse 69   Ht 5\' 2"  (1.575 m)   Wt 135 lb 6 oz (61.4 kg)   SpO2 99%   BMI 24.76 kg/m  Vitals and nursing notes reviewed  Physical Exam:  General: 24 y.o. female in NAD Cardio: RRR no m/r/g Lungs: CTAB, no wheezing, no rhonchi, no crackles,  no IWOB on RA Abdomen: Soft, diffusely mildly tender to palpation, increased tenderness in right lower quadrant compared to left lower quadrant, non-distended, positive bowel sounds, negative Murphy's, no guarding or rebound tenderness Skin: warm and dry Extremities: No edema Vaginal exam: Pelvic exam performed with patient supine.  Chaperone in room.  Bilateral labia without abnormalities, no inguinal LAD palpated.  Cervix exhibits yellow/white discharge, no cervix abnormalities.  No vaginal lesions.  Vaginal discharge yellow/white.  Cervical motion tenderness on bimanual exam.  No obvious adnexal masses or tenderness on exam.  Ordered   Results for orders placed or performed in visit on 09/12/19 (from the past 72 hour(s))  POCT Wet Prep [facility])     Status: Abnormal   Collection Time: 09/12/19  3:20 PM  Result Value Ref Range   Source Wet Prep POC VAG    WBC, Wet Prep HPF POC 0-3    Bacteria Wet Prep HPF POC Few Few   Clue Cells Wet Prep HPF POC Few (A) None   Clue Cells Wet Prep Whiff POC Positive Whiff    Yeast Wet Prep HPF POC None None   KOH Wet Prep POC None None   Trichomonas Wet Prep HPF POC Absent Absent  CBC     Status:  None   Collection Time: 09/12/19  3:36 PM  Result Value Ref Range   WBC 8.6 3.4 - 10.8 x10E3/uL   RBC 4.37 3.77 - 5.28 x10E6/uL   Hemoglobin 13.2 11.1 - 15.9 g/dL   Hematocrit 62.7 03.5 - 46.6 %   MCV 94 79 - 97 fL   MCH 30.2 26.6 - 33.0 pg   MCHC 32.0 31.5 - 35.7 g/dL   RDW 00.9 38.1 - 82.9 %   Platelets 217 150 - 450 x10E3/uL  Basic Metabolic Panel     Status: None   Collection Time: 09/12/19  3:36 PM  Result Value Ref Range   Glucose 81 65 - 99 mg/dL   BUN 14 6 - 20 mg/dL   Creatinine, Ser 9.37 0.57 - 1.00 mg/dL   GFR calc non Af Amer 114 >59 mL/min/1.73   GFR calc Af Amer 132 >59 mL/min/1.73   BUN/Creatinine Ratio 19 9 - 23   Sodium 140 134 - 144 mmol/L   Potassium 4.0 3.5 - 5.2 mmol/L   Chloride 105 96 - 106 mmol/L   CO2 24 20 - 29 mmol/L     Calcium 10.1 8.7 - 10.2 mg/dL    Assessment/Plan:  Lower abdominal pain Pain that patient describes and exam does not appear to be consistent with pain that would be caused by cholecystitis or cholelithiasis.  Discussed this with patient.  Given that pain is in bilateral lower quadrants, differential includes pelvic inflammatory disease, ovarian etiology, other intra-abdominal infection, diverticulitis versus appendicitis.  CBC obtained to rule out elevated white count in the setting of infection.  White blood cell count was within normal limits, making diverticulitis and appendicitis less likely.  Discussed case with Dr. Crissie Reese, [facility] fellow, who notes that given patient has had normal ultrasounds during pregnancy in the last few years, would not need repeat transvaginal ultrasound at this time.  Is also much less likely that an ovarian cyst or mass to be causing pain on both sides.  Did obtain gonorrhea and chlamydia given discharge, cervical motion tenderness, and possibility of pelvic inflammatory disease.  This is less likely however given this is such a chronic pain as it appears to have been going on for years on chart review.  Given this, will opt not to treat empirically at this time.  Will call patient for treatment if needed.  Could be functional abdominal pain.  Could also consider musculoskeletal cause given change with motion.  Could consider CT abdomen and pelvis in the future if this continues and there is no cause found.  Patient advised to follow-up in 1 month or sooner as needed.  ED precautions discussed including worsening of pain and inability to tolerate p.o.  Hypokalemia BMP checked today.  Reveals K within normal limits.  No evidence of hypokalemia today.  Encounter for screening for cervical cancer  Pap performed today.  Discussed with patient importance of obtaining regular Paps.  If normal, will repeat in 3 years.     PATIENT EDUCATION PROVIDED: See AVS    Diagnosis  and plan along with any newly prescribed medication(s) were discussed in detail with this patient today. The patient verbalized understanding and agreed with the plan. Patient advised if symptoms worsen return to clinic or ER.   Health Maintainance: Pap performed today.   Orders Placed This Encounter  Procedures  . CBC  . Basic Metabolic Panel  . POCT Wet Prep [facility])    No orders of the defined types were placed in  this encounter.    Luis Abed, DO 09/13/2019, 4:08 PM PGY-2 [facility]

## 2019-09-12 NOTE — Patient Instructions (Signed)
Thank you for coming to see me today. It was a pleasure. Today we talked about:   Your stomach pain: We will get labs.  We will let you know the results and the results of your pelvic exam.  Your psychiatrist will be Dr. Lolly Mustache and your appointment is on 1/20.  Please follow-up with me in 1 month.  If you have any questions or concerns, please do not hesitate to call the office at [phone].  Best,   Luis Abed, DO

## 2019-09-13 DIAGNOSIS — Z124 Encounter for screening for malignant neoplasm of cervix: Secondary | ICD-10-CM | POA: Insufficient documentation

## 2019-09-13 LAB — BASIC METABOLIC PANEL
BUN/Creatinine Ratio: 19 (ref 9–23)
BUN: 14 mg/dL (ref 6–20)
CO2: 24 mmol/L (ref 20–29)
Calcium: 10.1 mg/dL (ref 8.7–10.2)
Chloride: 105 mmol/L (ref 96–106)
Creatinine, Ser: 0.75 mg/dL (ref 0.57–1.00)
GFR calc Af Amer: 132 mL/min/{1.73_m2} (ref >59)
GFR calc non Af Amer: 114 mL/min/{1.73_m2} (ref >59)
Glucose: 81 mg/dL (ref 65–99)
Potassium: 4 mmol/L (ref 3.5–5.2)
Sodium: 140 mmol/L (ref 134–144)

## 2019-09-13 LAB — CBC
Hematocrit: 41.2 % (ref 34.0–46.6)
Hemoglobin: 13.2 g/dL (ref 11.1–15.9)
MCH: 30.2 pg (ref 26.6–33.0)
MCHC: 32 g/dL (ref 31.5–35.7)
MCV: 94 fL (ref 79–97)
Platelets: 217 10*3/uL (ref 150–450)
RBC: 4.37 x10E6/uL (ref 3.77–5.28)
RDW: 12.1 % (ref 11.7–15.4)
WBC: 8.6 10*3/uL (ref 3.4–10.8)

## 2019-09-13 NOTE — Assessment & Plan Note (Signed)
BMP checked today.  Reveals K within normal limits.  No evidence of hypokalemia today.

## 2019-09-13 NOTE — Assessment & Plan Note (Addendum)
Pap performed today.  Discussed with patient importance of obtaining regular Paps.  If normal, will repeat in 3 years.

## 2019-09-13 NOTE — Assessment & Plan Note (Signed)
Pain that patient describes and exam does not appear to be consistent with pain that would be caused by cholecystitis or cholelithiasis.  Discussed this with patient.  Given that pain is in bilateral lower quadrants, differential includes pelvic inflammatory disease, ovarian etiology, other intra-abdominal infection, diverticulitis versus appendicitis.  CBC obtained to rule out elevated white count in the setting of infection.  White blood cell count was within normal limits, making diverticulitis and appendicitis less likely.  Discussed case with Dr. Crissie Reese, [facility] fellow, who notes that given patient has had normal ultrasounds during pregnancy in the last few years, would not need repeat transvaginal ultrasound at this time.  Is also much less likely that an ovarian cyst or mass to be causing pain on both sides.  Did obtain gonorrhea and chlamydia given discharge, cervical motion tenderness, and possibility of pelvic inflammatory disease.  This is less likely however given this is such a chronic pain as it appears to have been going on for years on chart review.  Given this, will opt not to treat empirically at this time.  Will call patient for treatment if needed.  Could be functional abdominal pain.  Could also consider musculoskeletal cause given change with motion.  Could consider CT abdomen and pelvis in the future if this continues and there is no cause found.  Patient advised to follow-up in 1 month or sooner as needed.  ED precautions discussed including worsening of pain and inability to tolerate p.o.

## 2019-09-18 LAB — CYTOLOGY - PAP
Chlamydia: NEGATIVE
Comment: NEGATIVE
Comment: NEGATIVE
Comment: NORMAL
Diagnosis: UNDETERMINED — AB
High risk HPV: NEGATIVE
Neisseria Gonorrhea: NEGATIVE

## 2019-09-25 ENCOUNTER — Ambulatory Visit (HOSPITAL_COMMUNITY): Payer: Medicaid Other | Admitting: Psychiatry

## 2019-09-25 ENCOUNTER — Other Ambulatory Visit: Payer: Self-pay

## 2019-09-26 ENCOUNTER — Ambulatory Visit (INDEPENDENT_AMBULATORY_CARE_PROVIDER_SITE_OTHER): Payer: Medicaid Other | Admitting: Family Medicine

## 2019-09-26 ENCOUNTER — Other Ambulatory Visit: Payer: Self-pay

## 2019-09-26 ENCOUNTER — Encounter: Payer: Self-pay | Admitting: Family Medicine

## 2019-09-26 VITALS — BP 95/58 | HR 70 | Temp 98.5°F | Wt 132.0 lb

## 2019-09-26 DIAGNOSIS — R102 Pelvic and perineal pain: Secondary | ICD-10-CM

## 2019-09-26 DIAGNOSIS — R87610 Atypical squamous cells of undetermined significance on cytologic smear of cervix (ASC-US): Secondary | ICD-10-CM | POA: Diagnosis not present

## 2019-09-26 NOTE — Progress Notes (Signed)
  Patient Name: Tara Gonzalez Date of Birth: Jul 30, 1996 Date of Visit: 09/26/19 PCP: Unknown Jim, DO  Chief Complaint:   Subjective: Tara Gonzalez is a pleasant 24 y.o. with medical history significant for Depression, Anxiety presenting today for review PAP results.   Recent PAP smear positive for ASCUS, HPV negative.  Screening recommended to repeat in 1 year.  Still having some mild abdominal pain that she has been seeing her PCP for.  No vaginal discharge or bleeding.  ROS: Per HPI.   I have reviewed the patient's medical, surgical, family, and social history as appropriate.  Vitals:   09/26/19 0955  BP: (!) 95/58  Pulse: 70  Temp: 98.5 F (36.9 C)  SpO2: 98%    Abnormal PAP  -per guidelines routine screening -ultrasound abdomin Follow up with PCP as needed.  Dana Allan, MD  Family Medicine Teaching Service

## 2019-09-26 NOTE — Progress Notes (Signed)
CC: Abnormal PAP        Pelvic Pain  HPI:   Pelvic Pain: C/O B/L pelvic pain worse on the left on going for 2 months or more. Pain is cramping in nature about 7/10 in severity. Worse with certain movement. She denies GI or GU symptoms. No dysuria. She will like to get this checked.  Abnormal PAP: Sent by PCP to abnormal PAP result.         + ASCUS with negative HPV.  Exam:  Gen: Awake and alert, not in distress. HEENT: EOMI,PERRLA. Abd: Soft, mild left suprapubic tenderness. There was no abdominal distension.   A/P:  Pelvic Pain: Chronic.  Etiology unknown. ?? MSK vs ovarian cyst. Since it is chronic and persistent, now worsening according to the patient. We will evaluate her with pelvic US. Tylenol as needed for pain.  Abnormal PAP ASCCP recommendation for this age group is  Routine screening every three years. Colposcopy is not indicated.  Option given to discuss yearly PAP with her PCP.  F/U as needed.  She agreed with the plan.  Results  Cytology - PAP([facility]) (Order [id number]) MyChart Results Release  MyChart Status: Active Results Release  Contains abnormal data Cytology - PAP([facility]) Order: [id number] Status:  Edited Result - FINAL Visible to patient:  Yes (MyChart) Next appt:  None Dx:  Lower abdominal pain Component 2 wk ago  High risk HPV Negative   Neisseria Gonorrhea Negative   Chlamydia Negative   Adequacy Satisfactory for evaluation; transformation zone component PRESENT.   Diagnosis - Atypical squamous cells of undetermined significance (ASC-US)Abnormal    Comment Normal Reference Range HPV - Negative   Comment Normal Reference Ranger Chlamydia - Negative   Comment Normal Reference Range Neisseria Gonorrhea - Negative

## 2019-09-26 NOTE — Patient Instructions (Signed)
ASC-US The Pap test your clinician recently performed has shown some abnormal changes of the cervix called Atypical Squamous Cells of Undetermined Significance (ASC-US) (pronounced "ask-us"). Squamous cells are the thin flat cells that form the surface of the cervix. The squamous cells do not appear completely normal, and sometimes the changes are related to HPV infection, inflammation or a precancerous change. ASC-US are considered mild abnormalities. Approximately 3-7% of all Pap tests result in a diagnosis of ASC-US. Although ASC-US is an abnormal result that requires additional follow-up, it is important to understand that a diagnosis of ASC-US does not necessarily mean that you have cervical cancer.  Follow-up and Treatment Options for ASC-US Repeat Pap Test: Since your Pap test has shown a minor abnormality, your healthcare provider may repeat the test to determine whether further follow-up is needed. Many times, cellular changes in the cervix go away without treatment. If treatment is necessary, your doctor may choose to follow up with one of the options below. HPV Test: Your clinician may choose to perform an additional test for Human Papillomavirus (HPV). HPV testing in women with ASC-US may help identify underlying abnormalities that need a doctor's attention. A negative HPV test can provide reassurance that cancer or a precancerous condition is not present. The HPV test can be performed on your previous Pap test if it was collected with the liquid based methodology. If not, a second sample may be collected to perform this test. Colposcopy: Your healthcare provider may decide to perform an additional test called a colposcopy. In this procedure, an instrument similar to a microscope is inserted through the vagina and used to view the cervix directly. Your healthcare provider will be able to see the surface of the cervix clearly during the procedure and will look for any abnormal  areas. Biopsy: If areas of abnormal cells are seen during the colposcopy, your healthcare provider may biopsy (remove a small tissue sample) and send it to a laboratory for study under a microscope. Often, multiple areas of the cervix are biopsied during the procedure. While a Pap test is a screening test, a biopsy is a diagnostic test and will provide a definitive diagnosis.

## 2019-10-01 ENCOUNTER — Other Ambulatory Visit: Payer: Self-pay

## 2019-10-01 ENCOUNTER — Telehealth: Payer: Self-pay | Admitting: Family Medicine

## 2019-10-01 ENCOUNTER — Ambulatory Visit (HOSPITAL_COMMUNITY)
Admission: RE | Admit: 2019-10-01 | Discharge: 2019-10-01 | Disposition: A | Discharge status: Home / Self Care | Payer: Medicaid Other | Source: Ambulatory Visit | Attending: Family Medicine | Admitting: Family Medicine

## 2019-10-01 DIAGNOSIS — R102 Pelvic and perineal pain: Secondary | ICD-10-CM | POA: Diagnosis not present

## 2019-10-01 NOTE — Telephone Encounter (Signed)
I called and discussed her pelvic U/S which is not impressive except fr small pelvic fluid which could be benign vs related to STD.   She had a recent negative STD screen 2 weeks ago. She still wants to get repeat STD screen which I advised is not necessary unless she has a new exposure.  She will like to see her PCP for reassessment. Consider GYN referral of no improvement of her symptoms vs related to GI or MSK issue.  I was able to answer all her questions.  Appointment made. I will forward message to her PCP as well.

## 2019-10-03 NOTE — Progress Notes (Deleted)
     Subjective: No chief complaint on file.    HPI: Tara Gonzalez is a 24 y.o. presenting to clinic today to discuss the following:  1 Pelvic Pain Patient initially seen on 09/12/2019 complaining of 2 months of bilateral lower quadrant abdominal pain, also noted diarrhea.  Gonorrhea and Chlamydia collected at that time, were negative.  CBC and BMP were unremarkable.  Patient seen again on 09/26/2019, reported that pain was worsening.  Was sent for pelvic ultrasound that was unremarkable aside from a small amount of free fluid in the pelvis.    Health Maintenance: ***     ROS noted in HPI. Chief complaint noted.  Other Pertinent PMH: *** Past Medical, Surgical, Social, and Family History Reviewed & Updated per EMR.      Social History   Tobacco Use  Smoking Status Never Smoker  Smokeless Tobacco Never Used   Smoking status noted.    Objective: There were no vitals taken for this visit. Vitals and nursing notes reviewed  Physical Exam: *** General: 25 y.o. female in NAD Cardio: RRR no m/r/g Lungs: CTAB, no wheezing, no rhonchi, no crackles, no IWOB on *** Abdomen: Soft, non-tender to palpation, non-distended, positive bowel sounds Skin: warm and dry Extremities: No edema   No results found for this or any previous visit (from the past 72 hour(s)).  Assessment/Plan:  No problem-specific Assessment & Plan notes found for this encounter.     PATIENT EDUCATION PROVIDED: See AVS    Diagnosis and plan along with any newly prescribed medication(s) were discussed in detail with this patient today. The patient verbalized understanding and agreed with the plan. Patient advised if symptoms worsen return to clinic or ER.   Health Maintainance:   No orders of the defined types were placed in this encounter.   No orders of the defined types were placed in this encounter.    Luis Abed, DO 10/03/2019, 8:44 PM PGY-2 [facility]

## 2019-10-04 ENCOUNTER — Ambulatory Visit: Payer: Medicaid Other | Admitting: Family Medicine

## 2020-01-18 ENCOUNTER — Other Ambulatory Visit: Payer: Self-pay

## 2020-01-18 ENCOUNTER — Emergency Department (HOSPITAL_COMMUNITY)
Admission: EM | Admit: 2020-01-18 | Discharge: 2020-01-18 | Disposition: A | Discharge status: Home / Self Care | Payer: Medicaid Other | Attending: Emergency Medicine | Admitting: Emergency Medicine

## 2020-01-18 DIAGNOSIS — R112 Nausea with vomiting, unspecified: Secondary | ICD-10-CM | POA: Insufficient documentation

## 2020-01-18 DIAGNOSIS — I10 Essential (primary) hypertension: Secondary | ICD-10-CM | POA: Diagnosis not present

## 2020-01-18 DIAGNOSIS — R0902 Hypoxemia: Secondary | ICD-10-CM | POA: Diagnosis not present

## 2020-01-18 DIAGNOSIS — R197 Diarrhea, unspecified: Secondary | ICD-10-CM | POA: Insufficient documentation

## 2020-01-18 DIAGNOSIS — R1084 Generalized abdominal pain: Secondary | ICD-10-CM | POA: Diagnosis not present

## 2020-01-18 DIAGNOSIS — R519 Headache, unspecified: Secondary | ICD-10-CM | POA: Diagnosis not present

## 2020-01-18 DIAGNOSIS — Z72820 Sleep deprivation: Secondary | ICD-10-CM | POA: Diagnosis not present

## 2020-01-18 DIAGNOSIS — R1013 Epigastric pain: Secondary | ICD-10-CM | POA: Insufficient documentation

## 2020-01-18 LAB — COMPREHENSIVE METABOLIC PANEL
ALT: 30 U/L (ref 0–44)
AST: 26 U/L (ref 15–41)
Albumin: 4.6 g/dL (ref 3.5–5.0)
Alkaline Phosphatase: 61 U/L (ref 38–126)
Anion gap: 16 — ABNORMAL HIGH (ref 5–15)
BUN: 22 mg/dL — ABNORMAL HIGH (ref 6–20)
CO2: 23 mmol/L (ref 22–32)
Calcium: 9.1 mg/dL (ref 8.9–10.3)
Chloride: 98 mmol/L (ref 98–111)
Creatinine, Ser: 0.71 mg/dL (ref 0.44–1.00)
GFR calc Af Amer: >60 mL/min (ref >60)
GFR calc non Af Amer: >60 mL/min (ref >60)
Glucose, Bld: 103 mg/dL — ABNORMAL HIGH (ref 70–99)
Potassium: 3.4 mmol/L — ABNORMAL LOW (ref 3.5–5.1)
Sodium: 137 mmol/L (ref 135–145)
Total Bilirubin: 0.7 mg/dL (ref 0.3–1.2)
Total Protein: 7.4 g/dL (ref 6.5–8.1)

## 2020-01-18 LAB — CBC
HCT: 40.8 % (ref 36.0–46.0)
Hemoglobin: 13.7 g/dL (ref 12.0–15.0)
MCH: 30.9 pg (ref 26.0–34.0)
MCHC: 33.6 g/dL (ref 30.0–36.0)
MCV: 92.1 fL (ref 80.0–100.0)
Platelets: 204 10*3/uL (ref 150–400)
RBC: 4.43 MIL/uL (ref 3.87–5.11)
RDW: 12.5 % (ref 11.5–15.5)
WBC: 4.9 10*3/uL (ref 4.0–10.5)
nRBC: 0 % (ref 0.0–0.2)

## 2020-01-18 LAB — LIPASE, BLOOD: Lipase: 16 U/L (ref 11–51)

## 2020-01-18 LAB — I-STAT BETA HCG BLOOD, ED (MC, WL, AP ONLY): I-stat hCG, quantitative: <5 m[IU]/mL (ref <5)

## 2020-01-18 MED ORDER — SODIUM CHLORIDE 0.9 % IV BOLUS
1000.0000 mL | Freq: Once | INTRAVENOUS | Status: AC
  Administered 2020-01-18: 1000 mL via INTRAVENOUS

## 2020-01-18 MED ORDER — ONDANSETRON 4 MG PO TBDP: 4.0000 mg | ORAL_TABLET | Freq: Three times a day (TID) | ORAL | 0 refills | Status: DC | PRN

## 2020-01-18 MED ORDER — HALOPERIDOL LACTATE 5 MG/ML IJ SOLN
4.0000 mg | Freq: Once | INTRAMUSCULAR | Status: AC
  Administered 2020-01-18: 4 mg via INTRAVENOUS
  Filled 2020-01-18: qty 1

## 2020-01-18 MED ORDER — ONDANSETRON HCL 4 MG/2ML IJ SOLN
4.0000 mg | Freq: Once | INTRAMUSCULAR | Status: AC | PRN
  Administered 2020-01-18: 4 mg via INTRAVENOUS
  Filled 2020-01-18: qty 2

## 2020-01-18 NOTE — ED Provider Notes (Signed)
[facility]-EMERGENCY DEPT Provider Note   CSN: [id number] Arrival date & time: 01/18/20  8527     History Chief Complaint  Patient presents with  . Emesis    Tara Gonzalez is a 24 y.o. female.  HPI     3 days of nausea, vomiting Diarrhea twice per day Nonstop vomiting approx 15 times per day Unable to keep down water No fevers Abdominal pain, epigastric No known sick contacts Had some shrimp alfredo prior this happening Haven't slept in 3 days because of n/v/d  Past Medical History:  Diagnosis Date  . Anemia    during last pregnancy diagnosed   . Anxiety   . Bipolar disorder (HCC)   . Bipolar disorder (HCC)   . Depression    on going started diagnosed as child. not currently on medication   . Headache   . Pyelonephritis affecting pregnancy in first trimester     Patient Active Problem List   Diagnosis Date Noted  . Encounter for screening for cervical cancer  09/13/2019  . Encounter for counseling regarding contraception 02/15/2019  . Hyperemesis 05/20/2017  . Grief reaction 09/11/2015  . Hypokalemia 08/01/2015  . Lower abdominal pain 01/21/2013  . Bipolar disorder (HCC) 05/02/2012    Past Surgical History:  Procedure Laterality Date  . TONSILLECTOMY       OB History    Gravida  2   Para  2   Term  2   Preterm      AB      Living  2     SAB      TAB      Ectopic      Multiple  0   Live Births  2           Family History  Problem Relation Age of Onset  . Hypertension Mother   . Hyperlipidemia Mother   . Diabetes Maternal Grandmother        type II  . Congenital Murmur Maternal Grandmother        with VSD  . Diabetes Maternal Grandfather        type II  . Asthma Brother   . Autism spectrum disorder Brother     Social History   Tobacco Use  . Smoking status: Never Smoker  . Smokeless tobacco: Never Used  Substance Use Topics  . Alcohol use: No  . Drug use: No    Home Medications Prior to  Admission medications   Medication Sig Start Date End Date Taking? Authorizing Provider  ondansetron (ZOFRAN ODT) 4 MG disintegrating tablet Take 1 tablet (4 mg total) by mouth every 8 (eight) hours as needed for nausea or vomiting. 01/18/20   Alvira Monday, MD  polyethylene glycol powder (GLYCOLAX/MIRALAX) powder 1 capful daily mixed with beverage of your choice Patient not taking: Reported on 01/26/2019 01/02/18   Casey Burkitt, MD    Allergies    Patient has no known allergies.  Review of Systems   Review of Systems  Constitutional: Positive for fatigue. Negative for fever.  HENT: Negative for sore throat.   Eyes: Negative for visual disturbance.  Respiratory: Negative for cough and shortness of breath.   Cardiovascular: Negative for chest pain (with dry heaving, more epigastric pain).  Gastrointestinal: Positive for abdominal pain, diarrhea, nausea and vomiting.  Genitourinary: Negative for difficulty urinating.  Musculoskeletal: Negative for back pain and neck pain.  Skin: Negative for rash.  Neurological: Positive for light-headedness. Negative for syncope and headaches.  Physical Exam Updated Vital Signs BP 122/69   Pulse (!) 52   Temp 97.7 F (36.5 C) (Oral)   Resp (!) 21   Ht 5\' 1"  (1.549 m)   Wt 55.8 kg   SpO2 97%   BMI 23.24 kg/m   Physical Exam Vitals and nursing note reviewed.  Constitutional:      General: She is not in acute distress.    Appearance: She is well-developed. She is not diaphoretic.  HENT:     Head: Normocephalic and atraumatic.  Eyes:     Conjunctiva/sclera: Conjunctivae normal.  Cardiovascular:     Rate and Rhythm: Normal rate and regular rhythm.     Heart sounds: Normal heart sounds. No murmur. No friction rub. No gallop.   Pulmonary:     Effort: Pulmonary effort is normal. No respiratory distress.     Breath sounds: Normal breath sounds. No wheezing or rales.  Abdominal:     General: There is no distension.      Palpations: Abdomen is soft.     Tenderness: There is no abdominal tenderness. There is no guarding.  Musculoskeletal:        General: No tenderness.     Cervical back: Normal range of motion.  Skin:    General: Skin is warm and dry.     Findings: No erythema or rash.  Neurological:     Mental Status: She is alert and oriented to person, place, and time.     ED Results / Procedures / Treatments   Labs (all labs ordered are listed, but only abnormal results are displayed) Labs Reviewed  COMPREHENSIVE METABOLIC PANEL - Abnormal; Notable for the following components:      Result Value   Potassium 3.4 (*)    Glucose, Bld 103 (*)    BUN 22 (*)    Anion gap 16 (*)    All other components within normal limits  LIPASE, BLOOD  CBC  I-STAT BETA HCG BLOOD, ED ([facility], [facility], [facility] ONLY)    EKG EKG Interpretation  Date/Time:  Saturday Jan 18 2020 09:50:09 EDT Ventricular Rate:  59 PR Interval:    QRS Duration: 96 QT Interval:  449 QTC Calculation: 445 R Axis:   98 Text Interpretation: Right and left arm electrode reversal, interpretation assumes no reversal Ectopic atrial rhythm or lead reversal Otherwise similar ECG to prior Confirmed by [id number] (Alvira Monday) on 01/18/2020 8:54:01 PM   Radiology No results found.  Procedures Procedures (including critical care time)  Medications Ordered in ED Medications  ondansetron (ZOFRAN) injection 4 mg (4 mg Intravenous Given 01/18/20 0901)  sodium chloride 0.9 % bolus 1,000 mL (0 mLs Intravenous Stopped 01/18/20 1137)  haloperidol lactate (HALDOL) injection 4 mg (4 mg Intravenous Given 01/18/20 1001)    ED Course  I have reviewed the triage vital signs and the nursing notes.  Pertinent labs & imaging results that were available during my care of the patient were reviewed by me and considered in my medical decision making (see chart for details).    MDM Rules/Calculators/A&P                      24yo female with history above presents  with concern for nausea, vomiting and diarrhea.  Exam benign, no sign of cholecystitis, appendicitis, diverticulitis. No urinary symptoms, doubt UTI/pyelonephritis. Hx not consistent with PID/TOA. No sign of pancreatitis/hepatiits.   Suspect viral gastroenteritis.  Given IV fluids, haldol with improvement of symptoms. Able to tolerate  po. Given rx for zofran. Patient discharged in stable condition with understanding of reasons to return.    Final Clinical Impression(s) / ED Diagnoses Final diagnoses:  Nausea vomiting and diarrhea    Rx / DC Orders ED Discharge Orders         Ordered    ondansetron (ZOFRAN ODT) 4 MG disintegrating tablet  Every 8 hours PRN     01/18/20 1330           Gareth Morgan, MD 01/18/20 2100

## 2020-01-18 NOTE — ED Triage Notes (Signed)
Per EMS: Patient is coming from home with complaint of possible food poisoning after eating some shrimp alfredo from a "not professional establishment". Pregnancy status unknown.

## 2020-01-20 ENCOUNTER — Emergency Department (HOSPITAL_COMMUNITY)
Admission: EM | Admit: 2020-01-20 | Discharge: 2020-01-20 | Disposition: A | Discharge status: Home / Self Care | Payer: Medicaid Other | Attending: Emergency Medicine | Admitting: Emergency Medicine

## 2020-01-20 ENCOUNTER — Other Ambulatory Visit: Payer: Self-pay

## 2020-01-20 ENCOUNTER — Emergency Department (HOSPITAL_COMMUNITY): Payer: Medicaid Other

## 2020-01-20 ENCOUNTER — Encounter (HOSPITAL_COMMUNITY): Payer: Self-pay

## 2020-01-20 DIAGNOSIS — R1110 Vomiting, unspecified: Secondary | ICD-10-CM | POA: Diagnosis not present

## 2020-01-20 DIAGNOSIS — R52 Pain, unspecified: Secondary | ICD-10-CM | POA: Diagnosis not present

## 2020-01-20 DIAGNOSIS — R1031 Right lower quadrant pain: Secondary | ICD-10-CM | POA: Insufficient documentation

## 2020-01-20 DIAGNOSIS — R197 Diarrhea, unspecified: Secondary | ICD-10-CM | POA: Diagnosis not present

## 2020-01-20 DIAGNOSIS — R112 Nausea with vomiting, unspecified: Secondary | ICD-10-CM

## 2020-01-20 DIAGNOSIS — R1111 Vomiting without nausea: Secondary | ICD-10-CM | POA: Diagnosis not present

## 2020-01-20 LAB — CBC WITH DIFFERENTIAL/PLATELET
Abs Immature Granulocytes: 0.02 10*3/uL (ref 0.00–0.07)
Basophils Absolute: 0 10*3/uL (ref 0.0–0.1)
Basophils Relative: 0 %
Eosinophils Absolute: 0 10*3/uL (ref 0.0–0.5)
Eosinophils Relative: 0 %
HCT: 40.6 % (ref 36.0–46.0)
Hemoglobin: 13.6 g/dL (ref 12.0–15.0)
Immature Granulocytes: 0 %
Lymphocytes Relative: 31 %
Lymphs Abs: 1.9 10*3/uL (ref 0.7–4.0)
MCH: 31.3 pg (ref 26.0–34.0)
MCHC: 33.5 g/dL (ref 30.0–36.0)
MCV: 93.3 fL (ref 80.0–100.0)
Monocytes Absolute: 0.8 10*3/uL (ref 0.1–1.0)
Monocytes Relative: 12 %
Neutro Abs: 3.6 10*3/uL (ref 1.7–7.7)
Neutrophils Relative %: 57 %
Platelets: 183 10*3/uL (ref 150–400)
RBC: 4.35 MIL/uL (ref 3.87–5.11)
RDW: 12.3 % (ref 11.5–15.5)
WBC: 6.4 10*3/uL (ref 4.0–10.5)
nRBC: 0 % (ref 0.0–0.2)

## 2020-01-20 LAB — COMPREHENSIVE METABOLIC PANEL
ALT: 23 U/L (ref 0–44)
AST: 21 U/L (ref 15–41)
Albumin: 3.6 g/dL (ref 3.5–5.0)
Alkaline Phosphatase: 43 U/L (ref 38–126)
Anion gap: 14 (ref 5–15)
BUN: 12 mg/dL (ref 6–20)
CO2: 15 mmol/L — ABNORMAL LOW (ref 22–32)
Calcium: 7.5 mg/dL — ABNORMAL LOW (ref 8.9–10.3)
Chloride: 112 mmol/L — ABNORMAL HIGH (ref 98–111)
Creatinine, Ser: 0.61 mg/dL (ref 0.44–1.00)
GFR calc Af Amer: >60 mL/min (ref >60)
GFR calc non Af Amer: >60 mL/min (ref >60)
Glucose, Bld: 75 mg/dL (ref 70–99)
Potassium: 3.9 mmol/L (ref 3.5–5.1)
Sodium: 141 mmol/L (ref 135–145)
Total Bilirubin: 1.7 mg/dL — ABNORMAL HIGH (ref 0.3–1.2)
Total Protein: 5.6 g/dL — ABNORMAL LOW (ref 6.5–8.1)

## 2020-01-20 LAB — LIPASE, BLOOD: Lipase: 19 U/L (ref 11–51)

## 2020-01-20 LAB — I-STAT BETA HCG BLOOD, ED (MC, WL, AP ONLY): I-stat hCG, quantitative: <5 m[IU]/mL (ref <5)

## 2020-01-20 MED ORDER — SODIUM CHLORIDE (PF) 0.9 % IJ SOLN
INTRAMUSCULAR | Status: AC
  Filled 2020-01-20: qty 50

## 2020-01-20 MED ORDER — PROMETHAZINE HCL 25 MG PO TABS: 25.0000 mg | ORAL_TABLET | Freq: Four times a day (QID) | ORAL | 0 refills | Status: DC | PRN

## 2020-01-20 MED ORDER — SODIUM CHLORIDE 0.9 % IV BOLUS
1000.0000 mL | Freq: Once | INTRAVENOUS | Status: AC
  Administered 2020-01-20: 1000 mL via INTRAVENOUS

## 2020-01-20 MED ORDER — ONDANSETRON HCL 4 MG/2ML IJ SOLN
4.0000 mg | Freq: Once | INTRAMUSCULAR | Status: AC
  Administered 2020-01-20: 4 mg via INTRAVENOUS
  Filled 2020-01-20: qty 2

## 2020-01-20 MED ORDER — IOHEXOL 300 MG/ML  SOLN
100.0000 mL | Freq: Once | INTRAMUSCULAR | Status: AC | PRN
  Administered 2020-01-20: 100 mL via INTRAVENOUS

## 2020-01-20 MED ORDER — MORPHINE SULFATE (PF) 4 MG/ML IV SOLN
4.0000 mg | Freq: Once | INTRAVENOUS | Status: AC
  Administered 2020-01-20: 4 mg via INTRAVENOUS
  Filled 2020-01-20: qty 1

## 2020-01-20 NOTE — ED Provider Notes (Signed)
Tara Gonzalez Provider Note   CSN: [id number] Arrival date & time: 01/20/20  0409     History Chief Complaint  Patient presents with  . Abdominal Pain  . Nausea    Tara Gonzalez is a 24 y.o. female.  Patient presents to the emergency department with a chief complaint of moderate to severe abdominal pain.  She states that she has had nausea and vomiting for the past 4 days.  Reports that yesterday she began experiencing abdominal pain.  She reports history of gallstones.  She states that she does have some pain into her back.  She denies any fevers or chills.  She states that she had had some diarrhea.  She was seen about 2 days ago for the same and improved with fluids and antiemetics.  The history is provided by the patient. No language interpreter was used.       Past Medical History:  Diagnosis Date  . Anemia    during last pregnancy diagnosed   . Anxiety   . Bipolar disorder (HCC)   . Bipolar disorder (HCC)   . Depression    on going started diagnosed as child. not currently on medication   . Headache   . Pyelonephritis affecting pregnancy in first trimester     Patient Active Problem List   Diagnosis Date Noted  . Encounter for screening for cervical cancer  09/13/2019  . Encounter for counseling regarding contraception 02/15/2019  . Hyperemesis 05/20/2017  . Grief reaction 09/11/2015  . Hypokalemia 08/01/2015  . Lower abdominal pain 01/21/2013  . Bipolar disorder (HCC) 05/02/2012    Past Surgical History:  Procedure Laterality Date  . TONSILLECTOMY       OB History    Gravida  2   Para  2   Term  2   Preterm      AB      Living  2     SAB      TAB      Ectopic      Multiple  0   Live Births  2           Family History  Problem Relation Age of Onset  . Hypertension Mother   . Hyperlipidemia Mother   . Diabetes Maternal Grandmother        type II  . Congenital Murmur Maternal Grandmother    with VSD  . Diabetes Maternal Grandfather        type II  . Asthma Brother   . Autism spectrum disorder Brother     Social History   Tobacco Use  . Smoking status: Never Smoker  . Smokeless tobacco: Never Used  Substance Use Topics  . Alcohol use: No  . Drug use: No    Home Medications Prior to Admission medications   Medication Sig Start Date End Date Taking? Authorizing Provider  ondansetron (ZOFRAN ODT) 4 MG disintegrating tablet Take 1 tablet (4 mg total) by mouth every 8 (eight) hours as needed for nausea or vomiting. 01/18/20   Alvira Monday, MD  polyethylene glycol powder (GLYCOLAX/MIRALAX) powder 1 capful daily mixed with beverage of your choice Patient not taking: Reported on 01/26/2019 01/02/18   Casey Burkitt, MD    Allergies    Patient has no known allergies.  Review of Systems   Review of Systems  All other systems reviewed and are negative.   Physical Exam Updated Vital Signs BP 122/87 (BP Location: Left Arm)   Pulse 68  Temp 99.2 F (37.3 C) (Oral)   Resp 18   Ht 5\' 1"  (1.549 m)   Wt 54.4 kg   SpO2 100%   BMI 22.67 kg/m   Physical Exam Vitals and nursing note reviewed.  Constitutional:      General: She is not in acute distress.    Appearance: She is well-developed.  HENT:     Head: Normocephalic and atraumatic.  Eyes:     Conjunctiva/sclera: Conjunctivae normal.  Cardiovascular:     Rate and Rhythm: Normal rate and regular rhythm.     Heart sounds: No murmur.  Pulmonary:     Effort: Pulmonary effort is normal. No respiratory distress.     Breath sounds: Normal breath sounds.  Abdominal:     Palpations: Abdomen is soft.     Tenderness: There is abdominal tenderness in the right lower quadrant. There is no guarding. Positive signs include McBurney's sign. Negative signs include Murphy's sign.  Musculoskeletal:     Cervical back: Neck supple.  Skin:    General: Skin is warm and dry.  Neurological:     Mental Status: She is  alert.     ED Results / Procedures / Treatments   Labs (all labs ordered are listed, but only abnormal results are displayed) Labs Reviewed  CBC WITH DIFFERENTIAL/PLATELET  COMPREHENSIVE METABOLIC PANEL  LIPASE, BLOOD  I-STAT BETA HCG BLOOD, ED ([facility], [facility], [facility] ONLY)    EKG None  Radiology No results found.  Procedures Procedures (including critical care time)  Medications Ordered in ED Medications  sodium chloride 0.9 % bolus 1,000 mL (has no administration in time range)  ondansetron (ZOFRAN) injection 4 mg (has no administration in time range)  morphine 4 MG/ML injection 4 mg (has no administration in time range)    ED Course  I have reviewed the triage vital signs and the nursing notes.  Pertinent labs & imaging results that were available during my care of the patient were reviewed by me and considered in my medical decision making (see chart for details).    MDM Rules/Calculators/A&P                      This patient complains of abdominal pain and vomiting, this involves an extensive number of treatment options, and is a complaint that carries with it a high risk of complications and morbidity.  The differential diagnosis includes appendicitis, cholecystitis, TOA, torsion, UTI/pyelo.  Pertinent Labs I ordered, reviewed, and interpreted labs, which included CBC, CMP, lipase, which are notable for no leukocytosis, normal LFTs, tbili mildly elevated at 1.7.  Imaging Interpretation I ordered imaging studies which included CT abd/pel, which showed no evidence of appendicitis.  No gallstones.  Doubt cholecystitis.   Medications I ordered medication morphine, zofran, and fluids for pain, nausea and dehydration.  Sources Previous records obtained and reviewed recent visit for similar, but wasn't having any abdominal pain at the time. Prior notable for gallstones.  Consultants   Critical Interventions    Reassessments After the interventions stated above, I  reevaluated the patient and found improved.  Will discharge home with phenergan and strict return precautions.  Final Clinical Impression(s) / ED Diagnoses Final diagnoses:  Nausea vomiting and diarrhea  Right lower quadrant abdominal pain    Rx / DC Orders ED Discharge Orders         Ordered    promethazine (PHENERGAN) 25 MG tablet  Every 6 hours PRN     01/20/20 0615  Montine Circle, PA-C 01/20/20 8850    Orpah Greek, MD 01/20/20 [phone]

## 2020-01-20 NOTE — Discharge Instructions (Addendum)
Your CT scan showed no evidence of appendicitis or cholecystitis (gall bladder infection).  Additionally, no gallstones were seen.  Your blood counts were all unremarkable.    Please stay hydrated.  Take the medicine as prescribed.

## 2020-01-20 NOTE — ED Triage Notes (Signed)
Patient has had n/v/d x 4 days with new onset upper abdominal pain radiating to back.

## 2020-02-25 ENCOUNTER — Ambulatory Visit: Payer: Medicaid Other | Admitting: Family Medicine

## 2020-06-23 ENCOUNTER — Emergency Department (HOSPITAL_COMMUNITY): Payer: Medicaid Other

## 2020-06-23 ENCOUNTER — Encounter (HOSPITAL_COMMUNITY): Payer: Self-pay

## 2020-06-23 ENCOUNTER — Other Ambulatory Visit: Payer: Self-pay

## 2020-06-23 ENCOUNTER — Emergency Department (HOSPITAL_COMMUNITY)
Admission: EM | Admit: 2020-06-23 | Discharge: 2020-06-23 | Disposition: A | Discharge status: Home / Self Care | Payer: Medicaid Other | Attending: Emergency Medicine | Admitting: Emergency Medicine

## 2020-06-23 DIAGNOSIS — R1010 Upper abdominal pain, unspecified: Secondary | ICD-10-CM | POA: Diagnosis not present

## 2020-06-23 DIAGNOSIS — R52 Pain, unspecified: Secondary | ICD-10-CM | POA: Diagnosis not present

## 2020-06-23 DIAGNOSIS — K8020 Calculus of gallbladder without cholecystitis without obstruction: Secondary | ICD-10-CM | POA: Diagnosis not present

## 2020-06-23 DIAGNOSIS — R109 Unspecified abdominal pain: Secondary | ICD-10-CM | POA: Diagnosis not present

## 2020-06-23 DIAGNOSIS — R42 Dizziness and giddiness: Secondary | ICD-10-CM | POA: Diagnosis not present

## 2020-06-23 DIAGNOSIS — R112 Nausea with vomiting, unspecified: Secondary | ICD-10-CM | POA: Diagnosis not present

## 2020-06-23 DIAGNOSIS — R1084 Generalized abdominal pain: Secondary | ICD-10-CM | POA: Diagnosis not present

## 2020-06-23 DIAGNOSIS — R0902 Hypoxemia: Secondary | ICD-10-CM | POA: Diagnosis not present

## 2020-06-23 LAB — COMPREHENSIVE METABOLIC PANEL
ALT: 30 U/L (ref 0–44)
AST: 24 U/L (ref 15–41)
Albumin: 5.1 g/dL — ABNORMAL HIGH (ref 3.5–5.0)
Alkaline Phosphatase: 68 U/L (ref 38–126)
Anion gap: 16 — ABNORMAL HIGH (ref 5–15)
BUN: 13 mg/dL (ref 6–20)
CO2: 21 mmol/L — ABNORMAL LOW (ref 22–32)
Calcium: 9.9 mg/dL (ref 8.9–10.3)
Chloride: 105 mmol/L (ref 98–111)
Creatinine, Ser: 0.68 mg/dL (ref 0.44–1.00)
GFR, Estimated: >60 mL/min (ref >60)
Glucose, Bld: 93 mg/dL (ref 70–99)
Potassium: 3.2 mmol/L — ABNORMAL LOW (ref 3.5–5.1)
Sodium: 142 mmol/L (ref 135–145)
Total Bilirubin: 1.2 mg/dL (ref 0.3–1.2)
Total Protein: 8.8 g/dL — ABNORMAL HIGH (ref 6.5–8.1)

## 2020-06-23 LAB — CBC WITH DIFFERENTIAL/PLATELET
Abs Immature Granulocytes: 0.04 10*3/uL (ref 0.00–0.07)
Basophils Absolute: 0 10*3/uL (ref 0.0–0.1)
Basophils Relative: 0 %
Eosinophils Absolute: 0 10*3/uL (ref 0.0–0.5)
Eosinophils Relative: 0 %
HCT: 41.5 % (ref 36.0–46.0)
Hemoglobin: 13.9 g/dL (ref 12.0–15.0)
Immature Granulocytes: 0 %
Lymphocytes Relative: 13 %
Lymphs Abs: 1.5 10*3/uL (ref 0.7–4.0)
MCH: 31.2 pg (ref 26.0–34.0)
MCHC: 33.5 g/dL (ref 30.0–36.0)
MCV: 93 fL (ref 80.0–100.0)
Monocytes Absolute: 0.6 10*3/uL (ref 0.1–1.0)
Monocytes Relative: 5 %
Neutro Abs: 9.7 10*3/uL — ABNORMAL HIGH (ref 1.7–7.7)
Neutrophils Relative %: 82 %
Platelets: 252 10*3/uL (ref 150–400)
RBC: 4.46 MIL/uL (ref 3.87–5.11)
RDW: 12.6 % (ref 11.5–15.5)
WBC: 11.8 10*3/uL — ABNORMAL HIGH (ref 4.0–10.5)
nRBC: 0 % (ref 0.0–0.2)

## 2020-06-23 LAB — LIPASE, BLOOD: Lipase: 23 U/L (ref 11–51)

## 2020-06-23 LAB — I-STAT BETA HCG BLOOD, ED (MC, WL, AP ONLY): I-stat hCG, quantitative: <5 m[IU]/mL (ref <5)

## 2020-06-23 MED ORDER — HYDROCODONE-ACETAMINOPHEN 5-325 MG PO TABS
1.0000 | ORAL_TABLET | Freq: Four times a day (QID) | ORAL | 0 refills | Status: DC | PRN
Start: 2020-06-23 — End: 2020-06-26

## 2020-06-23 MED ORDER — PROMETHAZINE HCL 25 MG PO TABS: 25.0000 mg | ORAL_TABLET | Freq: Four times a day (QID) | ORAL | 1 refills | Status: DC | PRN

## 2020-06-23 MED ORDER — HYDROMORPHONE HCL 1 MG/ML IJ SOLN
1.0000 mg | Freq: Once | INTRAMUSCULAR | Status: AC
  Administered 2020-06-23: 1 mg via INTRAVENOUS
  Filled 2020-06-23: qty 1

## 2020-06-23 MED ORDER — ONDANSETRON HCL 4 MG/2ML IJ SOLN
4.0000 mg | Freq: Once | INTRAMUSCULAR | Status: AC
  Administered 2020-06-23: 4 mg via INTRAVENOUS
  Filled 2020-06-23: qty 2

## 2020-06-23 MED ORDER — SODIUM CHLORIDE 0.9 % IV SOLN: INTRAVENOUS | Status: DC

## 2020-06-23 MED ORDER — ONDANSETRON 4 MG PO TBDP: 4.0000 mg | ORAL_TABLET | Freq: Three times a day (TID) | ORAL | 1 refills | Status: DC | PRN

## 2020-06-23 MED ORDER — PANTOPRAZOLE SODIUM 40 MG IV SOLR
40.0000 mg | Freq: Once | INTRAVENOUS | Status: AC
  Administered 2020-06-23: 40 mg via INTRAVENOUS
  Filled 2020-06-23: qty 40

## 2020-06-23 MED ORDER — SODIUM CHLORIDE 0.9 % IV BOLUS
1000.0000 mL | Freq: Once | INTRAVENOUS | Status: AC
  Administered 2020-06-23: 1000 mL via INTRAVENOUS

## 2020-06-23 NOTE — ED Provider Notes (Signed)
[facility]-EMERGENCY DEPT Provider Note   CSN: [id number] Arrival date & time: 06/23/20  1054     History Chief Complaint  Patient presents with  . Abdominal Pain    Tara Gonzalez is a 24 y.o. female.  SomePatient with complaint of upper abdominal pain.  Patient states constant for 5 days.  Associated with nausea vomiting loose bowel movements.  Patient states she is vomited some specks of blood but no pools of blood.  Patient states she is get a history of gallstones that she was told about 3 years ago.  Has not had her gallbladder removed.  Patient also has a history of bipolar disorder.  Patient with multiple episodes of dry heaves here.  But no diarrhea.        Past Medical History:  Diagnosis Date  . Anemia    during last pregnancy diagnosed   . Anxiety   . Bipolar disorder (HCC)   . Bipolar disorder (HCC)   . Depression    on going started diagnosed as child. not currently on medication   . Headache   . Pyelonephritis affecting pregnancy in first trimester     Patient Active Problem List   Diagnosis Date Noted  . Encounter for screening for cervical cancer 09/13/2019  . Encounter for counseling regarding contraception 02/15/2019  . Hyperemesis 05/20/2017  . Grief reaction 09/11/2015  . Hypokalemia 08/01/2015  . Lower abdominal pain 01/21/2013  . Bipolar disorder (HCC) 05/02/2012    Past Surgical History:  Procedure Laterality Date  . TONSILLECTOMY       OB History    Gravida  2   Para  2   Term  2   Preterm      AB      Living  2     SAB      TAB      Ectopic      Multiple  0   Live Births  2           Family History  Problem Relation Age of Onset  . Hypertension Mother   . Hyperlipidemia Mother   . Diabetes Maternal Grandmother        type II  . Congenital Murmur Maternal Grandmother        with VSD  . Diabetes Maternal Grandfather        type II  . Asthma Brother   . Autism spectrum disorder  Brother     Social History   Tobacco Use  . Smoking status: Never Smoker  . Smokeless tobacco: Never Used  Substance Use Topics  . Alcohol use: No  . Drug use: No    Home Medications Prior to Admission medications   Medication Sig Start Date End Date Taking? Authorizing Provider  HYDROcodone-acetaminophen (NORCO/VICODIN) 5-325 MG tablet Take 1 tablet by mouth every 6 (six) hours as needed for moderate pain. 06/23/20   Vanetta Mulders, MD  ondansetron (ZOFRAN ODT) 4 MG disintegrating tablet Take 1 tablet (4 mg total) by mouth every 8 (eight) hours as needed for nausea or vomiting. Patient not taking: Reported on 06/23/2020 01/18/20   Alvira Monday, MD  ondansetron (ZOFRAN ODT) 4 MG disintegrating tablet Take 1 tablet (4 mg total) by mouth every 8 (eight) hours as needed for nausea or vomiting. 06/23/20   Vanetta Mulders, MD  polyethylene glycol powder [facility]) powder 1 capful daily mixed with beverage of your choice Patient not taking: Reported on 01/26/2019 01/02/18   Casey Burkitt, MD  promethazine (PHENERGAN) 25 MG tablet Take 1 tablet (25 mg total) by mouth every 6 (six) hours as needed for nausea or vomiting. Patient not taking: Reported on 06/23/2020 01/20/20   Roxy Horseman, PA-C  promethazine (PHENERGAN) 25 MG tablet Take 1 tablet (25 mg total) by mouth every 6 (six) hours as needed for nausea or vomiting. 06/23/20   Vanetta Mulders, MD    Allergies    Patient has no known allergies.  Review of Systems   Review of Systems  Constitutional: Negative for chills and fever.  HENT: Negative for congestion, rhinorrhea and sore throat.   Eyes: Negative for visual disturbance.  Respiratory: Negative for cough and shortness of breath.   Cardiovascular: Negative for chest pain and leg swelling.  Gastrointestinal: Positive for abdominal pain, diarrhea, nausea and vomiting.  Genitourinary: Negative for dysuria.  Musculoskeletal: Negative for back pain and  neck pain.  Skin: Negative for rash.  Neurological: Negative for dizziness, light-headedness and headaches.  Hematological: Does not bruise/bleed easily.  Psychiatric/Behavioral: Negative for confusion.    Physical Exam Updated Vital Signs BP 130/84   Pulse 91   Temp 98.3 F (36.8 C) (Oral)   Resp (!) 21   Ht 1.575 m (5\' 2" )   Wt 61 kg   LMP 06/22/2020   SpO2 100%   BMI 24.60 kg/m   Physical Exam Vitals and nursing note reviewed.  Constitutional:      General: She is not in acute distress.    Appearance: Normal appearance. She is well-developed.  HENT:     Head: Normocephalic and atraumatic.     Mouth/Throat:     Mouth: Mucous membranes are dry.  Eyes:     Extraocular Movements: Extraocular movements intact.     Conjunctiva/sclera: Conjunctivae normal.     Pupils: Pupils are equal, round, and reactive to light.  Cardiovascular:     Rate and Rhythm: Normal rate and regular rhythm.     Heart sounds: No murmur heard.   Pulmonary:     Effort: Pulmonary effort is normal. No respiratory distress.     Breath sounds: Normal breath sounds.  Chest:     Chest wall: No tenderness.  Abdominal:     Palpations: Abdomen is soft.     Tenderness: There is abdominal tenderness.     Comments: Tenderness to palpation epigastric and right upper quadrant area.  With some guarding.  Musculoskeletal:        General: Normal range of motion.     Cervical back: Normal range of motion and neck supple.  Skin:    General: Skin is warm and dry.     Capillary Refill: Capillary refill takes less than 2 seconds.  Neurological:     General: No focal deficit present.     Mental Status: She is alert and oriented to person, place, and time.     Cranial Nerves: No cranial nerve deficit.     Sensory: No sensory deficit.     ED Results / Procedures / Treatments   Labs (all labs ordered are listed, but only abnormal results are displayed) Labs Reviewed  CBC WITH DIFFERENTIAL/PLATELET -  Abnormal; Notable for the following components:      Result Value   WBC 11.8 (*)    Neutro Abs 9.7 (*)    All other components within normal limits  COMPREHENSIVE METABOLIC PANEL - Abnormal; Notable for the following components:   Potassium 3.2 (*)    CO2 21 (*)    Total Protein 8.8 (*)  Albumin 5.1 (*)    Anion gap 16 (*)    All other components within normal limits  LIPASE, BLOOD  I-STAT BETA HCG BLOOD, ED ([facility], [facility], [facility] ONLY)    EKG None   ED ECG REPORT   Date: 06/23/2020  Rate: 75  Rhythm: normal sinus rhythm  QRS Axis: normal  Intervals: normal  ST/T Wave abnormalities: early repolarization  Conduction Disutrbances:none  Narrative Interpretation:   Old EKG Reviewed: none available Correction: EKG from April of this year showed same early polarization changes.  And borderline Q waves in the lateral leads completely unchanged from the previous EKG. I have personally reviewed the EKG tracing and agree with the computerized printout as noted.  Radiology US Abdomen Complete  Result Date: 06/23/2020 CLINICAL DATA:  Diffuse abdominal pain.  History of gallstones. EXAM: ABDOMEN ULTRASOUND COMPLETE COMPARISON:  01/20/2020 CT abdomen pelvis and prior. 01/26/2019 abdominal ultrasound. FINDINGS: Gallbladder: Multiple gallstones measuring up to 0.6 cm. No pericholecystic free fluid or wall thickening. No sonographic Murphy sign noted by sonographer. Common bile duct: Diameter: 3.2 mm Liver: No focal lesion identified. Within normal limits in parenchymal echogenicity. Portal vein is patent on color Doppler imaging with normal direction of blood flow towards the liver. IVC: No abnormality visualized. Pancreas: Visualized portion unremarkable. Spleen: Size and appearance within normal limits. Right Kidney: Length: 11.1 cm. Echogenicity within normal limits. No mass or hydronephrosis visualized. Left Kidney: Length: 10.7 cm. Echogenicity within normal limits. No mass or hydronephrosis  visualized. Abdominal aorta: No aneurysm visualized. Other findings: None. IMPRESSION: Cholelithiasis without evidence of acute cholecystitis. Electronically Signed   By: Stana Bunting M.D.   On: 06/23/2020 13:19   DG Chest Port 1 View  Result Date: 06/23/2020 CLINICAL DATA:  Epigastric abdominal pain.  Nausea and vomiting. EXAM: PORTABLE CHEST 1 VIEW COMPARISON:  Jan 26, 2019 FINDINGS: The heart size and mediastinal contours are within normal limits. Both lungs are clear. No visible pleural effusions or pneumothorax. The visualized skeletal structures are unremarkable. IMPRESSION: No acute cardiopulmonary disease. Electronically Signed   By: Feliberto Harts MD   On: 06/23/2020 13:19    Procedures Procedures (including critical care time)  Medications Ordered in ED Medications  0.9 %  sodium chloride infusion ( Intravenous New Bag/Given 06/23/20 1241)  ondansetron (ZOFRAN) injection 4 mg (has no administration in time range)  sodium chloride 0.9 % bolus 1,000 mL (1,000 mLs Intravenous New Bag/Given 06/23/20 1231)  HYDROmorphone (DILAUDID) injection 1 mg (1 mg Intravenous Given 06/23/20 1231)  ondansetron (ZOFRAN) injection 4 mg (4 mg Intravenous Given 06/23/20 1231)  pantoprazole (PROTONIX) injection 40 mg (40 mg Intravenous Given 06/23/20 1301)    ED Course  I have reviewed the triage vital signs and the nursing notes.  Pertinent labs & imaging results that were available during my care of the patient were reviewed by me and considered in my medical decision making (see chart for details).    MDM Rules/Calculators/A&P                         Patient received 1 L bolus of normal saline.  900 cc an hour of fluids.  Received hydromorphone for pain.  And Zofran and Protonix.  Patient improved significantly.  Abdomen now nontender.  May be some slight tenderness right upper quadrant.  Labs with a mild leukocytosis with white blood cell count of 11,000 liver function tests were  normal.  Bilirubin was normal electrolytes without significant abnormalities.  Ultrasound of the abdomen consistent with cholelithiasis no gallbladder wall thickening no evidence of obstruction.  Nothing consistent with acute cholecystitis.  Offered patient surgical consultation here today.  Patient wanted to go home and follow-up with surgery as an outpatient.  Very high probability the patient's symptoms are related to the gallstones.  However it is a little curious that after 5 days the gallbladder shows no evidence of any significant inflammatory changes.  Patient will be discharged home with hydrocodone Phenergan and Zofran.  Patient requested the Phenergan.  And will have information to call general surgery for follow-up.  Final Clinical Impression(s) / ED Diagnoses Final diagnoses:  Calculus of gallbladder without cholecystitis without obstruction    Rx / DC Orders ED Discharge Orders         Ordered    HYDROcodone-acetaminophen (NORCO/VICODIN) 5-325 MG tablet  Every 6 hours PRN        06/23/20 1518    promethazine (PHENERGAN) 25 MG tablet  Every 6 hours PRN        06/23/20 1518    ondansetron (ZOFRAN ODT) 4 MG disintegrating tablet  Every 8 hours PRN        06/23/20 1518           Vanetta MuldersZackowski, Thaddaeus Granja, MD 06/23/20 1523

## 2020-06-23 NOTE — Discharge Instructions (Signed)
Contact general surgery for evaluation for the gallstones as the most likely cause of your upper abdominal pain.  No evidence of an infected gallbladder today.  No evidence of any obstruction or interference with your liver or pancreas.  If pain comes back in last 3 to 4 hours need to get seen again.  Take the pain medication as needed.  Take the antinausea medicine as needed.  Return for recurrent abdominal pain lasting 3 to 4 hours return for persistent nausea vomiting.  Work note provided.

## 2020-06-23 NOTE — ED Triage Notes (Signed)
Patient coming from home with complain of upper abdominal pain, nausea, vomiting for five days. Patient state she started to have diarrhea this morning. Patient was given 4 mg of Zofran per EMS.

## 2020-06-23 NOTE — ED Notes (Signed)
Pt unable to sign for AVS due to pad not working. Pt received AVS and was reviewed with pt.

## 2020-06-24 ENCOUNTER — Telehealth: Payer: Self-pay

## 2020-06-24 DIAGNOSIS — R109 Unspecified abdominal pain: Secondary | ICD-10-CM | POA: Diagnosis not present

## 2020-06-24 NOTE — Telephone Encounter (Signed)
Transition Care Management Unsuccessful Follow-up Telephone Call  Date of discharge and from where:  06/23/2020 from [facility]  Attempts:  1st Attempt  Reason for unsuccessful TCM follow-up call:  Unable to leave message

## 2020-06-25 ENCOUNTER — Observation Stay (HOSPITAL_COMMUNITY)
Admission: EM | Admit: 2020-06-25 | Discharge: 2020-06-26 | Disposition: A | Discharge status: Home / Self Care | Payer: Medicaid Other | Attending: Physician Assistant | Admitting: Physician Assistant

## 2020-06-25 ENCOUNTER — Observation Stay (HOSPITAL_COMMUNITY): Payer: Medicaid Other | Admitting: Certified Registered Nurse Anesthetist

## 2020-06-25 ENCOUNTER — Other Ambulatory Visit: Payer: Self-pay

## 2020-06-25 ENCOUNTER — Encounter (HOSPITAL_COMMUNITY)
Admission: EM | Disposition: A | Discharge status: Home / Self Care | Payer: Self-pay | Source: Home / Self Care | Attending: Emergency Medicine

## 2020-06-25 ENCOUNTER — Encounter (HOSPITAL_COMMUNITY): Payer: Self-pay

## 2020-06-25 ENCOUNTER — Emergency Department (HOSPITAL_COMMUNITY): Payer: Medicaid Other

## 2020-06-25 DIAGNOSIS — F319 Bipolar disorder, unspecified: Secondary | ICD-10-CM | POA: Diagnosis not present

## 2020-06-25 DIAGNOSIS — I959 Hypotension, unspecified: Secondary | ICD-10-CM | POA: Diagnosis not present

## 2020-06-25 DIAGNOSIS — E876 Hypokalemia: Secondary | ICD-10-CM | POA: Diagnosis not present

## 2020-06-25 DIAGNOSIS — R1110 Vomiting, unspecified: Secondary | ICD-10-CM | POA: Diagnosis not present

## 2020-06-25 DIAGNOSIS — K8020 Calculus of gallbladder without cholecystitis without obstruction: Secondary | ICD-10-CM

## 2020-06-25 DIAGNOSIS — R1011 Right upper quadrant pain: Secondary | ICD-10-CM | POA: Diagnosis present

## 2020-06-25 DIAGNOSIS — K810 Acute cholecystitis: Principal | ICD-10-CM | POA: Insufficient documentation

## 2020-06-25 DIAGNOSIS — R1084 Generalized abdominal pain: Secondary | ICD-10-CM | POA: Diagnosis not present

## 2020-06-25 DIAGNOSIS — Z20822 Contact with and (suspected) exposure to covid-19: Secondary | ICD-10-CM | POA: Diagnosis not present

## 2020-06-25 DIAGNOSIS — R110 Nausea: Secondary | ICD-10-CM | POA: Diagnosis not present

## 2020-06-25 DIAGNOSIS — K8010 Calculus of gallbladder with chronic cholecystitis without obstruction: Secondary | ICD-10-CM | POA: Diagnosis not present

## 2020-06-25 DIAGNOSIS — R1030 Lower abdominal pain, unspecified: Secondary | ICD-10-CM

## 2020-06-25 DIAGNOSIS — Z79899 Other long term (current) drug therapy: Secondary | ICD-10-CM | POA: Insufficient documentation

## 2020-06-25 DIAGNOSIS — D649 Anemia, unspecified: Secondary | ICD-10-CM | POA: Diagnosis not present

## 2020-06-25 DIAGNOSIS — R112 Nausea with vomiting, unspecified: Secondary | ICD-10-CM | POA: Diagnosis not present

## 2020-06-25 HISTORY — PX: CHOLECYSTECTOMY: SHX55

## 2020-06-25 LAB — COMPREHENSIVE METABOLIC PANEL
ALT: 24 U/L (ref 0–44)
AST: 21 U/L (ref 15–41)
Albumin: 4.5 g/dL (ref 3.5–5.0)
Alkaline Phosphatase: 64 U/L (ref 38–126)
Anion gap: 14 (ref 5–15)
BUN: 19 mg/dL (ref 6–20)
CO2: 20 mmol/L — ABNORMAL LOW (ref 22–32)
Calcium: 9.4 mg/dL (ref 8.9–10.3)
Chloride: 105 mmol/L (ref 98–111)
Creatinine, Ser: 0.77 mg/dL (ref 0.44–1.00)
GFR, Estimated: >60 mL/min (ref >60)
Glucose, Bld: 110 mg/dL — ABNORMAL HIGH (ref 70–99)
Potassium: 3.1 mmol/L — ABNORMAL LOW (ref 3.5–5.1)
Sodium: 139 mmol/L (ref 135–145)
Total Bilirubin: 0.8 mg/dL (ref 0.3–1.2)
Total Protein: 7.7 g/dL (ref 6.5–8.1)

## 2020-06-25 LAB — URINALYSIS, ROUTINE W REFLEX MICROSCOPIC
Bilirubin Urine: NEGATIVE
Glucose, UA: NEGATIVE mg/dL
Hgb urine dipstick: NEGATIVE
Ketones, ur: 5 mg/dL — AB
Leukocytes,Ua: NEGATIVE
Nitrite: NEGATIVE
Protein, ur: NEGATIVE mg/dL
Specific Gravity, Urine: >1.046 — ABNORMAL HIGH (ref 1.005–1.030)
pH: 7 (ref 5.0–8.0)

## 2020-06-25 LAB — CBC
HCT: 37.5 % (ref 36.0–46.0)
Hemoglobin: 12.7 g/dL (ref 12.0–15.0)
MCH: 31.2 pg (ref 26.0–34.0)
MCHC: 33.9 g/dL (ref 30.0–36.0)
MCV: 92.1 fL (ref 80.0–100.0)
Platelets: 249 10*3/uL (ref 150–400)
RBC: 4.07 MIL/uL (ref 3.87–5.11)
RDW: 12.8 % (ref 11.5–15.5)
WBC: 14.6 10*3/uL — ABNORMAL HIGH (ref 4.0–10.5)
nRBC: 0 % (ref 0.0–0.2)

## 2020-06-25 LAB — I-STAT BETA HCG BLOOD, ED (MC, WL, AP ONLY): I-stat hCG, quantitative: <5 m[IU]/mL (ref <5)

## 2020-06-25 LAB — RESPIRATORY PANEL BY RT PCR (FLU A&B, COVID)
Influenza A by PCR: NEGATIVE
Influenza B by PCR: NEGATIVE
SARS Coronavirus 2 by RT PCR: NEGATIVE

## 2020-06-25 LAB — LIPASE, BLOOD: Lipase: 28 U/L (ref 11–51)

## 2020-06-25 SURGERY — LAPAROSCOPIC CHOLECYSTECTOMY WITH INTRAOPERATIVE CHOLANGIOGRAM
Anesthesia: General | Site: Abdomen

## 2020-06-25 MED ORDER — DIPHENHYDRAMINE HCL 50 MG/ML IJ SOLN
25.0000 mg | Freq: Four times a day (QID) | INTRAMUSCULAR | Status: DC | PRN
  Administered 2020-06-26: 25 mg via INTRAVENOUS
  Filled 2020-06-25: qty 1

## 2020-06-25 MED ORDER — ONDANSETRON 4 MG PO TBDP
4.0000 mg | ORAL_TABLET | Freq: Once | ORAL | Status: DC | PRN
  Filled 2020-06-25: qty 1

## 2020-06-25 MED ORDER — BISACODYL 10 MG RE SUPP: 10.0000 mg | Freq: Every day | RECTAL | Status: DC | PRN

## 2020-06-25 MED ORDER — MIDAZOLAM HCL 2 MG/2ML IJ SOLN
INTRAMUSCULAR | Status: DC | PRN
  Administered 2020-06-25: 2 mg via INTRAVENOUS

## 2020-06-25 MED ORDER — METOPROLOL TARTRATE 5 MG/5ML IV SOLN: 5.0000 mg | Freq: Four times a day (QID) | INTRAVENOUS | Status: DC | PRN

## 2020-06-25 MED ORDER — ACETAMINOPHEN 325 MG PO TABS: 650.0000 mg | ORAL_TABLET | Freq: Four times a day (QID) | ORAL | Status: DC | PRN

## 2020-06-25 MED ORDER — FENTANYL CITRATE (PF) 100 MCG/2ML IJ SOLN
INTRAMUSCULAR | Status: DC | PRN
  Administered 2020-06-25: 100 ug via INTRAVENOUS
  Administered 2020-06-25: 50 ug via INTRAVENOUS
  Administered 2020-06-25: 100 ug via INTRAVENOUS

## 2020-06-25 MED ORDER — SCOPOLAMINE 1 MG/3DAYS TD PT72: 1.0000 | MEDICATED_PATCH | TRANSDERMAL | Status: DC

## 2020-06-25 MED ORDER — ONDANSETRON HCL 4 MG/2ML IJ SOLN
INTRAMUSCULAR | Status: AC
  Filled 2020-06-25: qty 2

## 2020-06-25 MED ORDER — KETOROLAC TROMETHAMINE 15 MG/ML IJ SOLN
15.0000 mg | INTRAMUSCULAR | Status: AC
  Administered 2020-06-25: 15 mg via INTRAVENOUS
  Filled 2020-06-25: qty 1

## 2020-06-25 MED ORDER — ROCURONIUM BROMIDE 10 MG/ML (PF) SYRINGE
PREFILLED_SYRINGE | INTRAVENOUS | Status: AC
  Filled 2020-06-25: qty 10

## 2020-06-25 MED ORDER — ACETAMINOPHEN 650 MG RE SUPP: 650.0000 mg | Freq: Four times a day (QID) | RECTAL | Status: DC | PRN

## 2020-06-25 MED ORDER — DEXAMETHASONE SODIUM PHOSPHATE 10 MG/ML IJ SOLN
INTRAMUSCULAR | Status: DC | PRN
  Administered 2020-06-25: 10 mg via INTRAVENOUS

## 2020-06-25 MED ORDER — ONDANSETRON 4 MG PO TBDP: 4.0000 mg | ORAL_TABLET | Freq: Four times a day (QID) | ORAL | Status: DC | PRN

## 2020-06-25 MED ORDER — HYDROMORPHONE HCL 1 MG/ML IJ SOLN
1.0000 mg | INTRAMUSCULAR | Status: DC | PRN
  Administered 2020-06-25 – 2020-06-26 (×2): 1 mg via INTRAVENOUS
  Filled 2020-06-25 (×2): qty 1

## 2020-06-25 MED ORDER — ONDANSETRON HCL 4 MG/2ML IJ SOLN
INTRAMUSCULAR | Status: DC | PRN
  Administered 2020-06-25: 4 mg via INTRAVENOUS

## 2020-06-25 MED ORDER — BUPIVACAINE-EPINEPHRINE (PF) 0.5% -1:200000 IJ SOLN
INTRAMUSCULAR | Status: DC | PRN
  Administered 2020-06-25: 30 mL

## 2020-06-25 MED ORDER — PROMETHAZINE HCL 25 MG/ML IJ SOLN
25.0000 mg | Freq: Once | INTRAMUSCULAR | Status: AC
  Administered 2020-06-25: 25 mg via INTRAVENOUS
  Filled 2020-06-25: qty 1

## 2020-06-25 MED ORDER — PANTOPRAZOLE SODIUM 40 MG IV SOLR
40.0000 mg | Freq: Every day | INTRAVENOUS | Status: DC
  Administered 2020-06-25: 40 mg via INTRAVENOUS
  Filled 2020-06-25: qty 40

## 2020-06-25 MED ORDER — MORPHINE SULFATE (PF) 4 MG/ML IV SOLN
4.0000 mg | Freq: Once | INTRAVENOUS | Status: AC
  Administered 2020-06-25: 4 mg via INTRAVENOUS
  Filled 2020-06-25: qty 1

## 2020-06-25 MED ORDER — ONDANSETRON HCL 4 MG/2ML IJ SOLN
4.0000 mg | Freq: Once | INTRAMUSCULAR | Status: AC
  Administered 2020-06-25: 4 mg via INTRAVENOUS
  Filled 2020-06-25: qty 2

## 2020-06-25 MED ORDER — ROCURONIUM BROMIDE 100 MG/10ML IV SOLN
INTRAVENOUS | Status: DC | PRN
  Administered 2020-06-25: 40 mg via INTRAVENOUS

## 2020-06-25 MED ORDER — LIDOCAINE 2% (20 MG/ML) 5 ML SYRINGE
INTRAMUSCULAR | Status: DC | PRN
  Administered 2020-06-25: 100 mg via INTRAVENOUS

## 2020-06-25 MED ORDER — LACTATED RINGERS IV SOLN: INTRAVENOUS | Status: DC | PRN

## 2020-06-25 MED ORDER — SODIUM CHLORIDE 0.9 % IV SOLN
2.0000 g | INTRAVENOUS | Status: DC
  Administered 2020-06-25: 2 g via INTRAVENOUS
  Filled 2020-06-25: qty 20

## 2020-06-25 MED ORDER — ONDANSETRON HCL 4 MG/2ML IJ SOLN: 4.0000 mg | Freq: Four times a day (QID) | INTRAMUSCULAR | Status: DC | PRN

## 2020-06-25 MED ORDER — GABAPENTIN 300 MG PO CAPS
300.0000 mg | ORAL_CAPSULE | ORAL | Status: DC
  Filled 2020-06-25: qty 1

## 2020-06-25 MED ORDER — HYDROMORPHONE HCL 1 MG/ML IJ SOLN
0.5000 mg | Freq: Once | INTRAMUSCULAR | Status: AC
  Administered 2020-06-25: 0.5 mg via INTRAVENOUS
  Filled 2020-06-25: qty 1

## 2020-06-25 MED ORDER — FENTANYL CITRATE (PF) 100 MCG/2ML IJ SOLN: 25.0000 ug | INTRAMUSCULAR | Status: DC | PRN

## 2020-06-25 MED ORDER — MIDAZOLAM HCL 2 MG/2ML IJ SOLN
INTRAMUSCULAR | Status: AC
  Filled 2020-06-25: qty 2

## 2020-06-25 MED ORDER — SUCCINYLCHOLINE CHLORIDE 200 MG/10ML IV SOSY
PREFILLED_SYRINGE | INTRAVENOUS | Status: AC
  Filled 2020-06-25: qty 10

## 2020-06-25 MED ORDER — DIPHENHYDRAMINE HCL 25 MG PO CAPS: 25.0000 mg | ORAL_CAPSULE | Freq: Four times a day (QID) | ORAL | Status: DC | PRN

## 2020-06-25 MED ORDER — FENTANYL CITRATE (PF) 100 MCG/2ML IJ SOLN
INTRAMUSCULAR | Status: AC
  Filled 2020-06-25: qty 2

## 2020-06-25 MED ORDER — PROPOFOL 10 MG/ML IV BOLUS
INTRAVENOUS | Status: DC | PRN
  Administered 2020-06-25: 200 mg via INTRAVENOUS

## 2020-06-25 MED ORDER — PHENYLEPHRINE 40 MCG/ML (10ML) SYRINGE FOR IV PUSH (FOR BLOOD PRESSURE SUPPORT)
PREFILLED_SYRINGE | INTRAVENOUS | Status: DC | PRN
  Administered 2020-06-25: 80 ug via INTRAVENOUS

## 2020-06-25 MED ORDER — SIMETHICONE 80 MG PO CHEW
40.0000 mg | CHEWABLE_TABLET | Freq: Four times a day (QID) | ORAL | Status: DC | PRN
  Filled 2020-06-25: qty 1

## 2020-06-25 MED ORDER — ONDANSETRON HCL 4 MG/2ML IJ SOLN: 4.0000 mg | Freq: Once | INTRAMUSCULAR | Status: DC | PRN

## 2020-06-25 MED ORDER — MEPERIDINE HCL 50 MG/ML IJ SOLN: 6.2500 mg | INTRAMUSCULAR | Status: DC | PRN

## 2020-06-25 MED ORDER — FENTANYL CITRATE (PF) 100 MCG/2ML IJ SOLN
INTRAMUSCULAR | Status: DC | PRN
Start: 2020-06-25 — End: 2020-06-25
  Administered 2020-06-25 (×2): 50 ug via INTRAVENOUS

## 2020-06-25 MED ORDER — POLYETHYLENE GLYCOL 3350 17 G PO PACK: 17.0000 g | PACK | Freq: Every day | ORAL | Status: DC | PRN

## 2020-06-25 MED ORDER — PROCHLORPERAZINE EDISYLATE 10 MG/2ML IJ SOLN: 5.0000 mg | Freq: Four times a day (QID) | INTRAMUSCULAR | Status: DC | PRN

## 2020-06-25 MED ORDER — PROMETHAZINE HCL 25 MG/ML IJ SOLN: 12.5000 mg | INTRAMUSCULAR | Status: DC | PRN

## 2020-06-25 MED ORDER — FENTANYL CITRATE (PF) 250 MCG/5ML IJ SOLN
INTRAMUSCULAR | Status: AC
  Filled 2020-06-25: qty 5

## 2020-06-25 MED ORDER — OXYCODONE HCL 5 MG PO TABS
5.0000 mg | ORAL_TABLET | ORAL | Status: DC | PRN
  Administered 2020-06-25 – 2020-06-26 (×3): 5 mg via ORAL
  Filled 2020-06-25 (×3): qty 1

## 2020-06-25 MED ORDER — PROCHLORPERAZINE MALEATE 10 MG PO TABS
10.0000 mg | ORAL_TABLET | Freq: Four times a day (QID) | ORAL | Status: DC | PRN
  Filled 2020-06-25: qty 1

## 2020-06-25 MED ORDER — SUCCINYLCHOLINE CHLORIDE 200 MG/10ML IV SOSY
PREFILLED_SYRINGE | INTRAVENOUS | Status: DC | PRN
  Administered 2020-06-25: 100 mg via INTRAVENOUS

## 2020-06-25 MED ORDER — IOHEXOL 300 MG/ML  SOLN
100.0000 mL | Freq: Once | INTRAMUSCULAR | Status: AC | PRN
  Administered 2020-06-25: 100 mL via INTRAVENOUS

## 2020-06-25 MED ORDER — LACTATED RINGERS IR SOLN
Status: DC | PRN
  Administered 2020-06-25: 1000 mL

## 2020-06-25 MED ORDER — SUGAMMADEX SODIUM 200 MG/2ML IV SOLN
INTRAVENOUS | Status: DC | PRN
  Administered 2020-06-25: 200 mg via INTRAVENOUS

## 2020-06-25 MED ORDER — ACETAMINOPHEN 500 MG PO TABS
1000.0000 mg | ORAL_TABLET | ORAL | Status: AC
  Administered 2020-06-25: 1000 mg via ORAL
  Filled 2020-06-25: qty 2

## 2020-06-25 MED ORDER — SCOPOLAMINE 1 MG/3DAYS TD PT72
MEDICATED_PATCH | TRANSDERMAL | Status: AC
  Administered 2020-06-25: 1.5 mg
  Filled 2020-06-25: qty 1

## 2020-06-25 MED ORDER — METHOCARBAMOL 500 MG PO TABS
500.0000 mg | ORAL_TABLET | Freq: Four times a day (QID) | ORAL | Status: DC | PRN
  Administered 2020-06-25: 500 mg via ORAL
  Filled 2020-06-25: qty 1

## 2020-06-25 MED ORDER — SODIUM CHLORIDE 0.9 % IV BOLUS
1000.0000 mL | Freq: Once | INTRAVENOUS | Status: AC
  Administered 2020-06-25: 1000 mL via INTRAVENOUS

## 2020-06-25 MED ORDER — BUPIVACAINE-EPINEPHRINE (PF) 0.5% -1:200000 IJ SOLN
INTRAMUSCULAR | Status: AC
  Filled 2020-06-25: qty 30

## 2020-06-25 MED ORDER — 0.9 % SODIUM CHLORIDE (POUR BTL) OPTIME
TOPICAL | Status: DC | PRN
  Administered 2020-06-25: 1000 mL

## 2020-06-25 MED ORDER — POTASSIUM CL IN DEXTROSE 5% 20 MEQ/L IV SOLN
20.0000 meq | INTRAVENOUS | Status: DC
  Administered 2020-06-25 – 2020-06-26 (×2): 20 meq via INTRAVENOUS
  Filled 2020-06-25 (×3): qty 1000

## 2020-06-25 MED ORDER — ENOXAPARIN SODIUM 40 MG/0.4ML ~~LOC~~ SOLN: 40.0000 mg | SUBCUTANEOUS | Status: DC

## 2020-06-25 MED ORDER — LIDOCAINE 2% (20 MG/ML) 5 ML SYRINGE
INTRAMUSCULAR | Status: AC
  Filled 2020-06-25: qty 5

## 2020-06-25 SURGICAL SUPPLY — 45 items
APPLIER CLIP 5 13 M/L LIGAMAX5 (MISCELLANEOUS)
APPLIER CLIP ROT 10 11.4 M/L (STAPLE)
BENZOIN TINCTURE PRP APPL 2/3 (GAUZE/BANDAGES/DRESSINGS) ×3 IMPLANT
BNDG ADH 1X3 SHEER STRL LF (GAUZE/BANDAGES/DRESSINGS) ×12 IMPLANT
CABLE HIGH FREQUENCY MONO STRZ (ELECTRODE) ×3 IMPLANT
CHLORAPREP W/TINT 26 (MISCELLANEOUS) ×3 IMPLANT
CLIP APPLIE 5 13 M/L LIGAMAX5 (MISCELLANEOUS) IMPLANT
CLIP APPLIE ROT 10 11.4 M/L (STAPLE) IMPLANT
CLIP VESOLOCK LG 6/CT PURPLE (CLIP) IMPLANT
CLIP VESOLOCK MED LG 6/CT (CLIP) ×6 IMPLANT
CLOSURE WOUND 1/2 X4 (GAUZE/BANDAGES/DRESSINGS) ×1
COVER MAYO STAND STRL (DRAPES) IMPLANT
COVER SURGICAL LIGHT HANDLE (MISCELLANEOUS) ×3 IMPLANT
COVER WAND RF STERILE (DRAPES) IMPLANT
DECANTER SPIKE VIAL GLASS SM (MISCELLANEOUS) ×3 IMPLANT
DERMABOND ADVANCED (GAUZE/BANDAGES/DRESSINGS) ×2
DERMABOND ADVANCED .7 DNX12 (GAUZE/BANDAGES/DRESSINGS) ×1 IMPLANT
DRAIN CHANNEL 19F RND (DRAIN) IMPLANT
DRAPE C-ARM 42X120 X-RAY (DRAPES) IMPLANT
EVACUATOR SILICONE 100CC (DRAIN) IMPLANT
GLOVE BIOGEL PI IND STRL 7.0 (GLOVE) ×1 IMPLANT
GLOVE BIOGEL PI INDICATOR 7.0 (GLOVE) ×2
GLOVE SURG SS PI 7.0 STRL IVOR (GLOVE) ×3 IMPLANT
GOWN STRL REUS W/TWL LRG LVL3 (GOWN DISPOSABLE) ×3 IMPLANT
GOWN STRL REUS W/TWL XL LVL3 (GOWN DISPOSABLE) ×6 IMPLANT
GRASPER SUT TROCAR 14GX15 (MISCELLANEOUS) IMPLANT
KIT BASIN OR (CUSTOM PROCEDURE TRAY) ×3 IMPLANT
KIT TURNOVER KIT A (KITS) ×3 IMPLANT
POUCH RETRIEVAL ECOSAC 10 (ENDOMECHANICALS) ×1 IMPLANT
POUCH RETRIEVAL ECOSAC 10MM (ENDOMECHANICALS) ×3
SCISSORS LAP 5X35 DISP (ENDOMECHANICALS) ×3 IMPLANT
SET CHOLANGIOGRAPH MIX (MISCELLANEOUS) IMPLANT
SET IRRIG TUBING LAPAROSCOPIC (IRRIGATION / IRRIGATOR) ×3 IMPLANT
SET TUBE SMOKE EVAC HIGH FLOW (TUBING) ×3 IMPLANT
SLEEVE XCEL OPT CAN 5 100 (ENDOMECHANICALS) ×6 IMPLANT
STOPCOCK 4 WAY LG BORE MALE ST (IV SETS) IMPLANT
STRIP CLOSURE SKIN 1/2X4 (GAUZE/BANDAGES/DRESSINGS) ×2 IMPLANT
SUT ETHILON 2 0 PS N (SUTURE) IMPLANT
SUT MNCRL AB 4-0 PS2 18 (SUTURE) ×3 IMPLANT
SUT VICRYL 0 ENDOLOOP (SUTURE) IMPLANT
TOWEL OR 17X26 10 PK STRL BLUE (TOWEL DISPOSABLE) ×3 IMPLANT
TOWEL OR NON WOVEN STRL DISP B (DISPOSABLE) IMPLANT
TRAY LAPAROSCOPIC (CUSTOM PROCEDURE TRAY) ×3 IMPLANT
TROCAR BLADELESS OPT 5 100 (ENDOMECHANICALS) ×3 IMPLANT
TROCAR XCEL NON-BLD 11X100MML (ENDOMECHANICALS) ×3 IMPLANT

## 2020-06-25 NOTE — Anesthesia Procedure Notes (Signed)
Procedure Name: Intubation Date/Time: 06/25/2020 2:32 PM Performed by: Montel Clock, CRNA Pre-anesthesia Checklist: Patient identified Patient Re-evaluated:Patient Re-evaluated prior to induction Oxygen Delivery Method: Circle system utilized Preoxygenation: Pre-oxygenation with 100% oxygen Induction Type: IV induction and Rapid sequence Laryngoscope Size: Mac and 3 Grade View: Grade I Tube type: Oral Tube size: 7.0 mm Number of attempts: 1 Airway Equipment and Method: Stylet Placement Confirmation: ETT inserted through vocal cords under direct vision,  positive ETCO2 and breath sounds checked- equal and bilateral Secured at: 21 cm Tube secured with: Tape Dental Injury: Teeth and Oropharynx as per pre-operative assessment  Comments: Performed by Jacqualine Code

## 2020-06-25 NOTE — Anesthesia Preprocedure Evaluation (Signed)
Anesthesia Evaluation  Patient identified by MRN, date of birth, ID band Patient awake    Reviewed: Allergy & Precautions, NPO status , Patient's Chart, lab work & pertinent test results  Airway Mallampati: II  TM Distance: >3 FB Neck ROM: Full    Dental no notable dental hx. (+) Teeth Intact   Pulmonary neg pulmonary ROS,    Pulmonary exam normal breath sounds clear to auscultation       Cardiovascular negative cardio ROS Normal cardiovascular exam Rhythm:Regular Rate:Normal     Neuro/Psych  Headaches, PSYCHIATRIC DISORDERS Anxiety Depression Bipolar Disorder    GI/Hepatic Neg liver ROS, Cholelithiasis with acute cholecystitis   Endo/Other  negative endocrine ROS  Renal/GU negative Renal ROS  negative genitourinary   Musculoskeletal negative musculoskeletal ROS (+)   Abdominal   Peds  Hematology  (+) anemia ,   Anesthesia Other Findings   Reproductive/Obstetrics                             Anesthesia Physical Anesthesia Plan  ASA: II and emergent  Anesthesia Plan: General   Post-op Pain Management:    Induction: Intravenous, Rapid sequence and Cricoid pressure planned  PONV Risk Score and Plan: 4 or greater and Treatment may vary due to age or medical condition, Ondansetron, Scopolamine patch - Pre-op, Midazolam and Dexamethasone  Airway Management Planned: Oral ETT  Additional Equipment: None  Intra-op Plan:   Post-operative Plan: Extubation in OR  Informed Consent: I have reviewed the patients History and Physical, chart, labs and discussed the procedure including the risks, benefits and alternatives for the proposed anesthesia with the patient or authorized representative who has indicated his/her understanding and acceptance.     Dental advisory given  Plan Discussed with: CRNA and Anesthesiologist  Anesthesia Plan Comments:         Anesthesia Quick  Evaluation

## 2020-06-25 NOTE — ED Notes (Signed)
Patient provided patient belonging bag and asked for all clothing to be removed and placed in bag. Patient asked to place gown on and socks and use CHG wipes.

## 2020-06-25 NOTE — Transfer of Care (Signed)
Immediate Anesthesia Transfer of Care Note  Patient: Tara Gonzalez  Procedure(s) Performed: LAPAROSCOPIC CHOLECYSTECTOMY (N/A Abdomen)  Patient Location: PACU  Anesthesia Type:General  Level of Consciousness: awake and alert   Airway & Oxygen Therapy: Patient Spontanous Breathing and Patient connected to face mask oxygen  Post-op Assessment: Report given to RN and Post -op Vital signs reviewed and stable  Post vital signs: Reviewed and stable  Last Vitals:  Vitals Value Taken Time  BP 143/90 06/25/20 1545  Temp    Pulse 91 06/25/20 1546  Resp 29 06/25/20 1546  SpO2 100 % 06/25/20 1546  Vitals shown include unvalidated device data.  Last Pain:  Vitals:   06/25/20 1410  TempSrc:   PainSc: 4       Patients Stated Pain Goal: 1 (06/25/20 1410)  Complications: No complications documented.

## 2020-06-25 NOTE — Discharge Instructions (Signed)
[facility] ° °Please arrive at least 30 min before your appointment to complete your check in paperwork.  If you are unable to arrive 30 min prior to your appointment time we may have to cancel or reschedule you. °LAPAROSCOPIC SURGERY: POST OP INSTRUCTIONS °Always review your discharge instruction sheet given to you by the facility where your surgery was performed. °IF YOU HAVE DISABILITY OR FAMILY LEAVE FORMS, YOU MUST BRING THEM TO THE OFFICE FOR PROCESSING.   °DO NOT GIVE THEM TO YOUR DOCTOR. ° °PAIN CONTROL ° °1. First take acetaminophen (Tylenol) AND/or ibuprofen (Advil) to control your pain after surgery.  Follow directions on package.  Taking acetaminophen (Tylenol) and/or ibuprofen (Advil) regularly after surgery will help to control your pain and lower the amount of prescription pain medication you may need.  You should not take more than 4,000 mg (4 grams) of acetaminophen (Tylenol) in 24 hours.  You should not take ibuprofen (Advil), aleve, motrin, naprosyn or other NSAIDS if you have a history of stomach ulcers or chronic kidney disease.  °2. A prescription for pain medication may be given to you upon discharge.  Take your pain medication as prescribed, if you still have uncontrolled pain after taking acetaminophen (Tylenol) or ibuprofen (Advil). °3. Use ice packs to help control pain. °4. If you need a refill on your pain medication, please contact your pharmacy.  They will contact our office to request authorization. Prescriptions will not be filled after 5pm or on week-ends. ° °HOME MEDICATIONS °5. Take your usually prescribed medications unless otherwise directed. ° °DIET °6. You should follow a light diet the first few days after arrival home.  Be sure to include lots of fluids daily. Avoid fatty, fried foods.  ° °CONSTIPATION °7. It is common to experience some constipation after surgery and if you are taking pain medication.  Increasing fluid intake and taking a stool  softener (such as Colace) will usually help or prevent this problem from occurring.  A mild laxative (Milk of Magnesia or Miralax) should be taken according to package instructions if there are no bowel movements after 48 hours. ° °WOUND/INCISION CARE °8. Most patients will experience some swelling and bruising in the area of the incisions.  Ice packs will help.  Swelling and bruising can take several days to resolve.  °9. Unless discharge instructions indicate otherwise, follow guidelines below  °a. STERI-STRIPS - you may remove your outer bandages 48 hours after surgery, and you may shower at that time.  You have steri-strips (small skin tapes) in place directly over the incision.  These strips should be left on the skin for 7-10 days.   °b. DERMABOND/SKIN GLUE - you may shower in 24 hours.  The glue will flake off over the next 2-3 weeks. °10. Any sutures or staples will be removed at the office during your follow-up visit. ° °ACTIVITIES °11. You may resume regular (light) daily activities beginning the next day--such as daily self-care, walking, climbing stairs--gradually increasing activities as tolerated.  You may have sexual intercourse when it is comfortable.  Refrain from any heavy lifting or straining until approved by your doctor. °a. You may drive when you are no longer taking prescription pain medication, you can comfortably wear a seatbelt, and you can safely maneuver your car and apply brakes. ° °FOLLOW-UP °12. You should see your doctor in the office for a follow-up appointment approximately 2-3 weeks after your surgery.  You should have been given your post-op/follow-up appointment when   your surgery was scheduled.  If you did not receive a post-op/follow-up appointment, make sure that you call for this appointment within a day or two after you arrive home to insure a convenient appointment time. ° °OTHER INSTRUCTIONS ° °WHEN TO CALL YOUR DOCTOR: °1. Fever over 101.0 °2. Inability to  urinate °3. Continued bleeding from incision. °4. Increased pain, redness, or drainage from the incision. °5. Increasing abdominal pain ° °The clinic staff is available to answer your questions during regular business hours.  Please don’t hesitate to call and ask to speak to one of the nurses for clinical concerns.  If you have a medical emergency, go to the nearest emergency room or call 911.  A surgeon from [facility] is always on call at the hospital. °[address], [address], [place], [place]  [address] ? [address] [address], [place], [place]   [address] °[phone] ? [phone] ? FAX [phone] ° ° ° °

## 2020-06-25 NOTE — H&P (Addendum)
[facility] Admission Note  Tara Gonzalez 07/14/1996  [id number].    Requesting MD: Virgina NorfolkAdam Curatolo Chief Complaint/Reason for Consult: gallstones  HPI:  Tara Gonzalez is a 24yo female with no significant PMH who [facility] with persistent abdominal pain. States that this episode started about 1 week ago.  Her pain was initially diffuse about her abdomen. Associated with multiple episodes of nausea and vomiting. She also reports having loose stools, no more than 2 per day. Denies fever, chills, dysuria. Denies any recent sick contacts. She came to the ED two days ago and was diagnosed with gallstones on u/s. She was discharged home with phenergan, zofran, and hydrocodone. Due to worsening symptoms she returned to the ED today. States that she has had similar but less severe pain over the last year. Previous episodes were random and not necessarily related to PO intake that she could tell. ED work up today included CT scan which shows a distended gallbladder without pericholecystic fluid, mild gastric antral thickening vs under distension. WBC 14.6, LFTs and lipase WNL. General surgery asked to see.  She did try to eat some crackers and drink water at 0530 this AM, but has vomited multiple times since then.  Abdominal surgical history: none Anticoagulants: none Nonsmoker Denies alcohol use  Denies illicit drug use Not currently employed Lives at home with her mother and 2 children Has not yet received covid vaccine  Review of Systems  Constitutional: Negative.   HENT: Negative.   Eyes: Negative.   Respiratory: Negative.   Cardiovascular: Negative.   Gastrointestinal: Positive for abdominal pain, diarrhea, nausea and vomiting.  Genitourinary: Negative.   Skin: Negative.   Neurological: Negative.     All systems reviewed and otherwise negative except for as above  Family History  Problem Relation Age of Onset  . Hypertension Mother   . Hyperlipidemia Mother    . Diabetes Maternal Grandmother        type II  . Congenital Murmur Maternal Grandmother        with VSD  . Diabetes Maternal Grandfather        type II  . Asthma Brother   . Autism spectrum disorder Brother     Past Medical History:  Diagnosis Date  . Anemia    during last pregnancy diagnosed   . Anxiety   . Bipolar disorder (HCC)   . Bipolar disorder (HCC)   . Depression    on going started diagnosed as child. not currently on medication   . Headache   . Pyelonephritis affecting pregnancy in first trimester     Past Surgical History:  Procedure Laterality Date  . TONSILLECTOMY      Social History:  reports that she has never smoked. She has never used smokeless tobacco. She reports that she does not drink alcohol and does not use drugs.  Allergies: No Known Allergies  (Not in a hospital admission)   Prior to Admission medications   Medication Sig Start Date End Date Taking? Authorizing Provider  HYDROcodone-acetaminophen (NORCO/VICODIN) 5-325 MG tablet Take 1 tablet by mouth every 6 (six) hours as needed for moderate pain. 06/23/20   Vanetta MuldersZackowski, Scott, MD  ondansetron (ZOFRAN ODT) 4 MG disintegrating tablet Take 1 tablet (4 mg total) by mouth every 8 (eight) hours as needed for nausea or vomiting. Patient not taking: Reported on 06/23/2020 01/18/20   Alvira MondaySchlossman, Erin, MD  ondansetron (ZOFRAN ODT) 4 MG disintegrating tablet Take 1 tablet (4 mg total) by mouth every 8 (  eight) hours as needed for nausea or vomiting. 06/23/20   Vanetta Mulders, MD  polyethylene glycol powder [facility]) powder 1 capful daily mixed with beverage of your choice Patient not taking: Reported on 01/26/2019 01/02/18   Casey Burkitt, MD  promethazine (PHENERGAN) 25 MG tablet Take 1 tablet (25 mg total) by mouth every 6 (six) hours as needed for nausea or vomiting. Patient not taking: Reported on 06/23/2020 01/20/20   Roxy Horseman, PA-C  promethazine (PHENERGAN) 25 MG tablet  Take 1 tablet (25 mg total) by mouth every 6 (six) hours as needed for nausea or vomiting. 06/23/20   Vanetta Mulders, MD    Blood pressure (!) 131/91, pulse 65, temperature 98.2 F (36.8 C), temperature source Oral, resp. rate 20, height 5\' 2"  (1.575 m), weight 55.8 kg, last menstrual period 06/22/2020, SpO2 100 %. Physical Exam: General: WD/WN female who is laying in bed in NAD but appears very uncomfortable HEENT: head is normocephalic, atraumatic.  Sclera are noninjected.  PERRL.  Ears and nose without any masses or lesions.  Mouth is pink and moist. Dentition fair Heart: regular, rate, and rhythm.  Normal s1,s2. No obvious murmurs, gallops, or rubs noted.  Palpable pedal pulses bilaterally  Lungs: CTAB, no wheezes, rhonchi, or rales noted.  Respiratory effort nonlabored Abd: soft, ND, +BS, no masses, hernias, or organomegaly. Mild diffuse tenderness with more significant pain in the epigastric region, no peritonitis MS: no BUE/BLE edema, calves soft and nontender Skin: warm and dry with no masses, lesions, or rashes Psych: A&Ox4 with an appropriate affect Neuro: cranial nerves grossly intact, equal strength in BUE/BLE bilaterally, normal speech, thought process intact  Results for orders placed or performed during the hospital encounter of 06/25/20 (from the past 48 hour(s))  Lipase, blood     Status: None   Collection Time: 06/25/20  8:25 AM  Result Value Ref Range   Lipase 28 11 - 51 U/L    Comment: Performed at [facility], [address], [place], [place]  Comprehensive metabolic panel     Status: Abnormal   Collection Time: 06/25/20  8:25 AM  Result Value Ref Range   Sodium 139 135 - 145 mmol/L   Potassium 3.1 (L) 3.5 - 5.1 mmol/L   Chloride 105 98 - 111 mmol/L   CO2 20 (L) 22 - 32 mmol/L   Glucose, Bld 110 (H) 70 - 99 mg/dL    Comment: Glucose reference range applies only to samples taken after fasting for at least 8 hours.   BUN 19 6 - 20  mg/dL   Creatinine, Ser 06/27/20 0.44 - 1.00 mg/dL   Calcium 9.4 8.9 - 5.03 mg/dL   Total Protein 7.7 6.5 - 8.1 g/dL   Albumin 4.5 3.5 - 5.0 g/dL   AST 21 15 - 41 U/L   ALT 24 0 - 44 U/L   Alkaline Phosphatase 64 38 - 126 U/L   Total Bilirubin 0.8 0.3 - 1.2 mg/dL   GFR, Estimated 54.6 >56 mL/min    Comment: (NOTE) Calculated using the CKD-EPI Creatinine Equation (2021)    Anion gap 14 5 - 15    Comment: Performed at [facility], [address], [place], [place]  CBC     Status: Abnormal   Collection Time: 06/25/20  8:25 AM  Result Value Ref Range   WBC 14.6 (H) 4.0 - 10.5 K/uL   RBC 4.07 3.87 - 5.11 MIL/uL   Hemoglobin 12.7 12.0 - 15.0  g/dL   HCT 60.1 36 - 46 %   MCV 92.1 80.0 - 100.0 fL   MCH 31.2 26.0 - 34.0 pg   MCHC 33.9 30.0 - 36.0 g/dL   RDW 09.3 23.5 - 57.3 %   Platelets 249 150 - 400 K/uL   nRBC 0.0 0.0 - 0.2 %    Comment: Performed at [facility], [address], [place], [place] [address]  I-Stat beta hCG blood, ED     Status: None   Collection Time: 06/25/20  8:31 AM  Result Value Ref Range   I-stat hCG, quantitative <5.0 <5 mIU/mL   Comment 3            Comment:   GEST. AGE      CONC.  (mIU/mL)   <=1 WEEK        5 - 50     2 WEEKS       50 - 500     3 WEEKS       100 - 10,000     4 WEEKS     1,000 - 30,000        FEMALE AND NON-PREGNANT FEMALE:     LESS THAN 5 mIU/mL    US Abdomen Complete  Result Date: 06/23/2020 CLINICAL DATA:  Diffuse abdominal pain.  History of gallstones. EXAM: ABDOMEN ULTRASOUND COMPLETE COMPARISON:  01/20/2020 CT abdomen pelvis and prior. 01/26/2019 abdominal ultrasound. FINDINGS: Gallbladder: Multiple gallstones measuring up to 0.6 cm. No pericholecystic free fluid or wall thickening. No sonographic Murphy sign noted by sonographer. Common bile duct: Diameter: 3.2 mm Liver: No focal lesion identified. Within normal limits in parenchymal echogenicity. Portal vein is patent on color Doppler  imaging with normal direction of blood flow towards the liver. IVC: No abnormality visualized. Pancreas: Visualized portion unremarkable. Spleen: Size and appearance within normal limits. Right Kidney: Length: 11.1 cm. Echogenicity within normal limits. No mass or hydronephrosis visualized. Left Kidney: Length: 10.7 cm. Echogenicity within normal limits. No mass or hydronephrosis visualized. Abdominal aorta: No aneurysm visualized. Other findings: None. IMPRESSION: Cholelithiasis without evidence of acute cholecystitis. Electronically Signed   By: Stana Bunting M.D.   On: 06/23/2020 13:19   CT ABDOMEN PELVIS W CONTRAST  Result Date: 06/25/2020 CLINICAL DATA:  Nausea vomiting epigastric pain in this 24 year old female with gallstones discovered on recent sonogram EXAM: CT ABDOMEN AND PELVIS WITH CONTRAST TECHNIQUE: Multidetector CT imaging of the abdomen and pelvis was performed using the standard protocol following bolus administration of intravenous contrast. CONTRAST:  OMNIPAQUE IOHEXOL 300 MG/ML  SOLN COMPARISON:  CT from Jan 20, 2020 and ultrasound 06/23/2020 FINDINGS: Lower chest: Lung bases are clear.  No effusion.  No consolidation. Hepatobiliary: No focal, suspicious hepatic lesion. Gallbladder moderately distended measuring approximately 8 cm greatest craniocaudal extent. There is no gallbladder wall thickening or evidence of pericholecystic stranding. No biliary duct dilation. Portal vein is patent. Pancreas: Pancreas is normal without ductal dilation or sign of inflammation. Spleen: Spleen normal size and contour.  No focal splenic lesion. Adrenals/Urinary Tract: Adrenal glands are normal. Symmetric renal enhancement. No hydronephrosis. Urinary bladder under distended, without focal abnormality. Stomach/Bowel: Colon is under distended limiting assessment. The appendix is normal. Small-bowel nondilated. No perigastric stranding. Question mild antral thickening versus under distension.  Vascular/Lymphatic: Vascular structures in the abdomen are patent. Aorta is of normal caliber. There is no gastrohepatic or hepatoduodenal ligament lymphadenopathy. No retroperitoneal or mesenteric lymphadenopathy. No pelvic sidewall lymphadenopathy. Reproductive: Tampon in the vagina. CT appearance  of uterus and adnexa unremarkable. Other: No free air.  No ascites. Musculoskeletal: No acute musculoskeletal process. IMPRESSION: 1. Question mild gastric antral thickening versus under distension. Correlate with any symptoms of gastritis. 2. Question mild wall thickening versus under distension of the colon. No pericolonic stranding or increased vascularity. Correlate with risk factors for colitis such as recent antibiotic administration. Findings likely due to under distension. 3. Moderate distension of the gallbladder without pericholecystic stranding. 4. Normal appendix Electronically Signed   By: Donzetta Kohut M.D.   On: 06/25/2020 10:44   DG Chest Port 1 View  Result Date: 06/23/2020 CLINICAL DATA:  Epigastric abdominal pain.  Nausea and vomiting. EXAM: PORTABLE CHEST 1 VIEW COMPARISON:  Jan 26, 2019 FINDINGS: The heart size and mediastinal contours are within normal limits. Both lungs are clear. No visible pleural effusions or pneumothorax. The visualized skeletal structures are unremarkable. IMPRESSION: No acute cardiopulmonary disease. Electronically Signed   By: Feliberto Harts MD   On: 06/23/2020 13:19      Assessment/Plan  Abdominal pain, nausea, vomiting, diarrhea Gallstones Possible cholecystitis - Patient with 1 week of worsening abdominal pain and intolerance to PO intake. Known h/o gallstones. CT scan today shows a distended gallbladder without pericholecystic fluid. WBC is 14.6, LFTs and lipase WNL. She complains more of left sided abdominal and flank pain but otherwise history, symptoms, and imaging could be consistent with gallbladder as the possible source of her symptoms. Will  review with MD. May benefit from cholecystectomy. Keep NPO. Covid test pending.   Franne Forts, PA-C [facility] 06/25/2020, 11:42 AM Please see Amion for pager number during day hours 7:00am-4:30pm

## 2020-06-25 NOTE — Op Note (Signed)
PATIENT:  Tara Gonzalez  24 y.o. female  PRE-OPERATIVE DIAGNOSIS:  acute cholecystitis  POST-OPERATIVE DIAGNOSIS:  acute cholecystitis  PROCEDURE:  Procedure(s): LAPAROSCOPIC CHOLECYSTECTOMY   SURGEON:  Surgeon(s): Amaro Mangold, De Blanch, MD  ASSISTANT: Carlena Bjornstad, PAC  ANESTHESIA:   local and general  Indications for procedure: Tara Gonzalez is a 24 y.o. female with symptoms of Abdominal pain and Nausea and vomiting consistent with gallbladder disease, Confirmed by Ultrasound and CT.  Description of procedure: The patient was brought into the operative suite, placed supine. Anesthesia was administered with endotracheal tube. Patient was strapped in place and foot board was secured. All pressure points were offloaded by foam padding. The patient was prepped and draped in the usual sterile fashion.  A small incision was made to the right of the umbilicus. A 56mm trocar was inserted into the peritoneal cavity with optical entry. Pneumoperitoneum was applied with high flow low pressure. 2 46mm trocars were placed in the RUQ. A 32mm trocar was placed in the subxiphoid space. Marcaine was infused to the subxiphoid space and lateral upper right abdomen in the transversus abdominis plane. Next the patient was placed in reverse trendelenberg. The gallbladder was blue with minimal signs of inflammation.  The gallbladder was retracted cephalad and lateral. The peritoneum was reflected off the infundibulum working lateral to medial. The cystic duct and cystic artery were identified and further dissection revealed a critical view. The cystic duct and cystic artery were doubly clipped and ligated.   The gallbladder was removed off the liver bed with cautery. The Gallbladder was placed in a specimen bag. The gallbladder fossa was irrigated and hemostasis was applied with cautery. The gallbladder was removed via the 80mm trocar. No dilation was required for removal, therefore no fascial closure was performed.  Pneumoperitoneum was removed, all trocar were removed. All incisions were closed with 4-0 monocryl subcuticular stitch. The patient woke from anesthesia and was brought to PACU in stable condition. All counts were correct  Findings: small gallstones  Specimen: gallbladder  Blood loss: 20 ml  Local anesthesia: 30 ml marcaine  Complications: none  PLAN OF CARE: Admit for overnight observation  PATIENT DISPOSITION:  PACU - hemodynamically stable.  Images:     Feliciana Rossetti, M.D. General, Bariatric, & Minimally Invasive Surgery [facility]

## 2020-06-25 NOTE — ED Notes (Signed)
Short stay in room.

## 2020-06-25 NOTE — ED Triage Notes (Signed)
Per EMS, was here on the 1 9th for same symptoms-possible gallstones-N/V abdominal pain-symptoms for 1 week-able to keep pain and nausea meds down

## 2020-06-25 NOTE — ED Triage Notes (Signed)
Per EMS- Patient c/o abdominal pain and reports recent diagnosis of gallstones.  patient actively vomiting in triage.

## 2020-06-25 NOTE — ED Provider Notes (Signed)
[facility]-EMERGENCY DEPT Provider Note   CSN: [id number] Arrival date & time: 06/25/20  0753     History Chief Complaint  Patient presents with   Abdominal Pain   Emesis    Tara Gonzalez is a 24 y.o. female presenting for evaluation of abdominal pain, nausea, vomiting.  Patient states her symptoms began a week ago, but worsened 3 days ago.  She is seen in the ED 2 days ago for the same, diagnosed with gallstones.  She was discharged on Zofran and Norco, but states she continues to have severe nausea and pain, unable to tolerate p.o.  She reports pain has worsened, and she now also has worsened pain under her left rib cage.  That pain is worse with movement, inspiration, vomiting.  She denies new fevers, chills, chest pain, shortness of breath, cough.  She denies change in urination or bowel movements.  She reports no previous history of abdominal problems or surgeries.  She takes no medications daily.  She is currently on her period.  Additional history obtained from chart review.  Reviewed ER visit from 2 days ago.  Patient had ultrasound which showed gallstones without infection.  Labs were reassuring at that time.   HPI     Past Medical History:  Diagnosis Date   Anemia    during last pregnancy diagnosed    Anxiety    Bipolar disorder (HCC)    Bipolar disorder (HCC)    Depression    on going started diagnosed as child. not currently on medication    Headache    Pyelonephritis affecting pregnancy in first trimester     Patient Active Problem List   Diagnosis Date Noted   Encounter for screening for cervical cancer 09/13/2019   Encounter for counseling regarding contraception 02/15/2019   Hyperemesis 05/20/2017   Grief reaction 09/11/2015   Hypokalemia 08/01/2015   Lower abdominal pain 01/21/2013   Bipolar disorder (HCC) 05/02/2012    Past Surgical History:  Procedure Laterality Date   TONSILLECTOMY       OB History     Gravida  2   Para  2   Term  2   Preterm      AB      Living  2     SAB      TAB      Ectopic      Multiple  0   Live Births  2           Family History  Problem Relation Age of Onset   Hypertension Mother    Hyperlipidemia Mother    Diabetes Maternal Grandmother        type II   Congenital Murmur Maternal Grandmother        with VSD   Diabetes Maternal Grandfather        type II   Asthma Brother    Autism spectrum disorder Brother     Social History   Tobacco Use   Smoking status: Never Smoker   Smokeless tobacco: Never Used  Building services engineer Use: Never used  Substance Use Topics   Alcohol use: No   Drug use: No    Home Medications Prior to Admission medications   Medication Sig Start Date End Date Taking? Authorizing Provider  HYDROcodone-acetaminophen (NORCO/VICODIN) 5-325 MG tablet Take 1 tablet by mouth every 6 (six) hours as needed for moderate pain. 06/23/20   Vanetta Mulders, MD  ondansetron (ZOFRAN ODT) 4 MG disintegrating tablet  Take 1 tablet (4 mg total) by mouth every 8 (eight) hours as needed for nausea or vomiting. Patient not taking: Reported on 06/23/2020 01/18/20   Alvira MondaySchlossman, Erin, MD  ondansetron (ZOFRAN ODT) 4 MG disintegrating tablet Take 1 tablet (4 mg total) by mouth every 8 (eight) hours as needed for nausea or vomiting. 06/23/20   Vanetta MuldersZackowski, Scott, MD  polyethylene glycol powder [facility](GLYCOLAX/MIRALAX) powder 1 capful daily mixed with beverage of your choice Patient not taking: Reported on 01/26/2019 01/02/18   Casey BurkittFitzgerald, Hillary Moen, MD  promethazine (PHENERGAN) 25 MG tablet Take 1 tablet (25 mg total) by mouth every 6 (six) hours as needed for nausea or vomiting. Patient not taking: Reported on 06/23/2020 01/20/20   Roxy HorsemanBrowning, Robert, PA-C  promethazine (PHENERGAN) 25 MG tablet Take 1 tablet (25 mg total) by mouth every 6 (six) hours as needed for nausea or vomiting. 06/23/20   Vanetta MuldersZackowski, Scott, MD    Allergies      Patient has no known allergies.  Review of Systems   Review of Systems  Gastrointestinal: Positive for abdominal pain, nausea and vomiting.  All other systems reviewed and are negative.   Physical Exam Updated Vital Signs BP (!) 131/91    Pulse 65    Temp 98.2 F (36.8 C) (Oral)    Resp 20    Ht 5\' 2"  (1.575 m)    Wt 55.8 kg    LMP 06/22/2020    SpO2 100%    BMI 22.50 kg/m   Physical Exam Vitals and nursing note reviewed.  Constitutional:      General: She is not in acute distress.    Appearance: She is well-developed.     Comments: Appears uncomfortable due to pain. Otherwise nontoxic  HENT:     Head: Normocephalic and atraumatic.  Eyes:     Extraocular Movements: Extraocular movements intact.     Conjunctiva/sclera: Conjunctivae normal.     Pupils: Pupils are equal, round, and reactive to light.  Cardiovascular:     Rate and Rhythm: Normal rate and regular rhythm.     Pulses: Normal pulses.  Pulmonary:     Effort: Pulmonary effort is normal. No respiratory distress.     Breath sounds: Normal breath sounds. No wheezing.  Abdominal:     General: There is no distension.     Palpations: Abdomen is soft. There is no mass.     Tenderness: There is abdominal tenderness. There is left CVA tenderness. There is no right CVA tenderness, guarding or rebound.     Comments: Diffuse TTP of the abdomen, worse in the right upper quadrant abdomen.  Also left-sided CVA tenderness.  No rigidity or distention, mild voluntary guarding.  Exam not consistent with peritonitis.  No rebound.  Musculoskeletal:        General: Normal range of motion.     Cervical back: Normal range of motion and neck supple.  Skin:    General: Skin is warm and dry.     Capillary Refill: Capillary refill takes less than 2 seconds.  Neurological:     Mental Status: She is alert and oriented to person, place, and time.     ED Results / Procedures / Treatments   Labs (all labs ordered are listed, but only  abnormal results are displayed) Labs Reviewed  COMPREHENSIVE METABOLIC PANEL - Abnormal; Notable for the following components:      Result Value   Potassium 3.1 (*)    CO2 20 (*)    Glucose, Bld 110 (*)  All other components within normal limits  CBC - Abnormal; Notable for the following components:   WBC 14.6 (*)    All other components within normal limits  RESPIRATORY PANEL BY RT PCR (FLU A&B, COVID)  LIPASE, BLOOD  URINALYSIS, ROUTINE W REFLEX MICROSCOPIC  I-STAT BETA HCG BLOOD, ED ([facility], [facility], [facility] ONLY)    EKG None  Radiology US Abdomen Complete  Result Date: 06/23/2020 CLINICAL DATA:  Diffuse abdominal pain.  History of gallstones. EXAM: ABDOMEN ULTRASOUND COMPLETE COMPARISON:  01/20/2020 CT abdomen pelvis and prior. 01/26/2019 abdominal ultrasound. FINDINGS: Gallbladder: Multiple gallstones measuring up to 0.6 cm. No pericholecystic free fluid or wall thickening. No sonographic Murphy sign noted by sonographer. Common bile duct: Diameter: 3.2 mm Liver: No focal lesion identified. Within normal limits in parenchymal echogenicity. Portal vein is patent on color Doppler imaging with normal direction of blood flow towards the liver. IVC: No abnormality visualized. Pancreas: Visualized portion unremarkable. Spleen: Size and appearance within normal limits. Right Kidney: Length: 11.1 cm. Echogenicity within normal limits. No mass or hydronephrosis visualized. Left Kidney: Length: 10.7 cm. Echogenicity within normal limits. No mass or hydronephrosis visualized. Abdominal aorta: No aneurysm visualized. Other findings: None. IMPRESSION: Cholelithiasis without evidence of acute cholecystitis. Electronically Signed   By: Stana Bunting M.D.   On: 06/23/2020 13:19   CT ABDOMEN PELVIS W CONTRAST  Result Date: 06/25/2020 CLINICAL DATA:  Nausea vomiting epigastric pain in this 24 year old female with gallstones discovered on recent sonogram EXAM: CT ABDOMEN AND PELVIS WITH CONTRAST TECHNIQUE:  Multidetector CT imaging of the abdomen and pelvis was performed using the standard protocol following bolus administration of intravenous contrast. CONTRAST:  OMNIPAQUE IOHEXOL 300 MG/ML  SOLN COMPARISON:  CT from Jan 20, 2020 and ultrasound 06/23/2020 FINDINGS: Lower chest: Lung bases are clear.  No effusion.  No consolidation. Hepatobiliary: No focal, suspicious hepatic lesion. Gallbladder moderately distended measuring approximately 8 cm greatest craniocaudal extent. There is no gallbladder wall thickening or evidence of pericholecystic stranding. No biliary duct dilation. Portal vein is patent. Pancreas: Pancreas is normal without ductal dilation or sign of inflammation. Spleen: Spleen normal size and contour.  No focal splenic lesion. Adrenals/Urinary Tract: Adrenal glands are normal. Symmetric renal enhancement. No hydronephrosis. Urinary bladder under distended, without focal abnormality. Stomach/Bowel: Colon is under distended limiting assessment. The appendix is normal. Small-bowel nondilated. No perigastric stranding. Question mild antral thickening versus under distension. Vascular/Lymphatic: Vascular structures in the abdomen are patent. Aorta is of normal caliber. There is no gastrohepatic or hepatoduodenal ligament lymphadenopathy. No retroperitoneal or mesenteric lymphadenopathy. No pelvic sidewall lymphadenopathy. Reproductive: Tampon in the vagina. CT appearance of uterus and adnexa unremarkable. Other: No free air.  No ascites. Musculoskeletal: No acute musculoskeletal process. IMPRESSION: 1. Question mild gastric antral thickening versus under distension. Correlate with any symptoms of gastritis. 2. Question mild wall thickening versus under distension of the colon. No pericolonic stranding or increased vascularity. Correlate with risk factors for colitis such as recent antibiotic administration. Findings likely due to under distension. 3. Moderate distension of the gallbladder without  pericholecystic stranding. 4. Normal appendix Electronically Signed   By: Donzetta Kohut M.D.   On: 06/25/2020 10:44   DG Chest Port 1 View  Result Date: 06/23/2020 CLINICAL DATA:  Epigastric abdominal pain.  Nausea and vomiting. EXAM: PORTABLE CHEST 1 VIEW COMPARISON:  Jan 26, 2019 FINDINGS: The heart size and mediastinal contours are within normal limits. Both lungs are clear. No visible pleural effusions or pneumothorax. The visualized skeletal structures are  unremarkable. IMPRESSION: No acute cardiopulmonary disease. Electronically Signed   By: Feliberto Harts MD   On: 06/23/2020 13:19    Procedures Procedures (including critical care time)  Medications Ordered in ED Medications  promethazine (PHENERGAN) injection 25 mg (25 mg Intravenous Given 06/25/20 0905)  morphine 4 MG/ML injection 4 mg (4 mg Intravenous Given 06/25/20 0906)  sodium chloride 0.9 % bolus 1,000 mL (0 mLs Intravenous Stopped 06/25/20 1117)  iohexol (OMNIPAQUE) 300 MG/ML solution 100 mL (100 mLs Intravenous Contrast Given 06/25/20 0933)  ondansetron (ZOFRAN) injection 4 mg (4 mg Intravenous Given 06/25/20 1115)    ED Course  I have reviewed the triage vital signs and the nursing notes.  Pertinent labs & imaging results that were available during my care of the patient were reviewed by me and considered in my medical decision making (see chart for details).    MDM Rules/Calculators/A&P                          Patient presenting for evaluation of nausea, vomiting abdominal pain.  On exam, patient appears uncomfortable due to pain, she is vomiting in the room, but overall appears nontoxic.  She does have diffuse abdominal tenderness, worse in the right upper quadrant with positive Murphy's.  In the setting of recent gallstones, likely gallbladder in nature.  However patient also with worsened left-sided pain and with CVA tenderness on the left side.  As such, will obtain labs and CT for further evaluation.  We will  control symptomatically with morphine and Phenergan and fluids.  Labs interpreted by me, overall reassuring.  Mild leukocytosis of 14, however electrolytes stable.  LFTs and bili normal.  CT shows distended gallbladder.  Thickening of the gastric and colonic mucosa, consider due to vomiting.  On reassessment, patient is still vomiting, and has severe pain.  In the setting of gallstones with intractable pain and vomiting, will consult general surgery.  Discussed with Nehemiah Settle, PA from general surgery who will evaluate patient.  Final Clinical Impression(s) / ED Diagnoses Final diagnoses:  Calculus of gallbladder without cholecystitis without obstruction    Rx / DC Orders ED Discharge Orders    None       Alveria Apley, PA-C 06/25/20 1120    Virgina Norfolk, DO 06/25/20 1354

## 2020-06-25 NOTE — Telephone Encounter (Signed)
Transition Care Management Unsuccessful Follow-up Telephone Call  Date of discharge and from where:  06/23/2020 from [facility]  Attempts:  2nd Attempt  Reason for unsuccessful TCM follow-up call:  Unable to leave message

## 2020-06-25 NOTE — Anesthesia Procedure Notes (Signed)
Date/Time: 06/25/2020 3:34 PM Performed by: Minerva Ends, CRNA Oxygen Delivery Method: Simple face mask Placement Confirmation: breath sounds checked- equal and bilateral and positive ETCO2 Dental Injury: Teeth and Oropharynx as per pre-operative assessment

## 2020-06-25 NOTE — Anesthesia Postprocedure Evaluation (Signed)
Anesthesia Post Note  Patient: Tara Gonzalez  Procedure(s) Performed: LAPAROSCOPIC CHOLECYSTECTOMY (N/A Abdomen)     Patient location during evaluation: PACU Anesthesia Type: General Level of consciousness: awake and alert Pain management: pain level controlled Vital Signs Assessment: post-procedure vital signs reviewed and stable Respiratory status: spontaneous breathing, nonlabored ventilation and respiratory function stable Cardiovascular status: blood pressure returned to baseline and stable Postop Assessment: no apparent nausea or vomiting Anesthetic complications: no   No complications documented.  Last Vitals:  Vitals:   06/25/20 1612 06/25/20 1615  BP:  138/76  Pulse:  85  Resp:  15  Temp:    SpO2: 100% 100%    Last Pain:  Vitals:   06/25/20 1615  TempSrc:   PainSc: 0-No pain                 [name] A.

## 2020-06-26 ENCOUNTER — Encounter (HOSPITAL_COMMUNITY): Payer: Self-pay | Admitting: General Surgery

## 2020-06-26 MED ORDER — OXYCODONE HCL 5 MG PO TABS
5.0000 mg | ORAL_TABLET | Freq: Four times a day (QID) | ORAL | 0 refills | Status: DC | PRN
Start: 2020-06-26 — End: 2022-07-31

## 2020-06-26 MED ORDER — ACETAMINOPHEN 325 MG PO TABS
650.0000 mg | ORAL_TABLET | Freq: Four times a day (QID) | ORAL | Status: DC
  Administered 2020-06-26: 10:00:00 650 mg via ORAL
  Filled 2020-06-26: qty 2

## 2020-06-26 MED ORDER — ACETAMINOPHEN 650 MG RE SUPP: 650.0000 mg | Freq: Four times a day (QID) | RECTAL | Status: DC

## 2020-06-26 MED ORDER — IBUPROFEN 100 MG PO TABS: 400.0000 mg | ORAL_TABLET | Freq: Four times a day (QID) | ORAL | Status: DC | PRN

## 2020-06-26 MED ORDER — ACETAMINOPHEN 325 MG PO TABS: 650.0000 mg | ORAL_TABLET | Freq: Four times a day (QID) | ORAL | Status: DC | PRN

## 2020-06-26 MED ORDER — ONDANSETRON 4 MG PO TBDP: 4.0000 mg | ORAL_TABLET | Freq: Four times a day (QID) | ORAL | 0 refills | Status: DC | PRN

## 2020-06-26 MED ORDER — POLYETHYLENE GLYCOL 3350 17 G PO PACK: 17.0000 g | PACK | Freq: Every day | ORAL | 0 refills | Status: DC | PRN

## 2020-06-26 MED ORDER — IBUPROFEN 400 MG PO TABS: 400.0000 mg | ORAL_TABLET | Freq: Four times a day (QID) | ORAL | Status: DC | PRN

## 2020-06-26 MED ORDER — METHOCARBAMOL 500 MG PO TABS: 500.0000 mg | ORAL_TABLET | Freq: Four times a day (QID) | ORAL | 0 refills | Status: DC | PRN

## 2020-06-26 MED ORDER — HYDROMORPHONE HCL 1 MG/ML IJ SOLN: 1.0000 mg | INTRAMUSCULAR | Status: DC | PRN

## 2020-06-26 MED ORDER — METHOCARBAMOL 500 MG PO TABS
500.0000 mg | ORAL_TABLET | Freq: Four times a day (QID) | ORAL | Status: DC
  Administered 2020-06-26: 10:00:00 500 mg via ORAL
  Filled 2020-06-26: qty 1

## 2020-06-26 NOTE — Plan of Care (Signed)
Pt was discharged home today. Instructions were reviewed with patient, and questions were answered. Pt was taken to main entrance via wheelchair by NT.  

## 2020-06-26 NOTE — Telephone Encounter (Signed)
Pt was admitted on 06/25/2020 and had a cholecystectomy and was discharged today.

## 2020-06-26 NOTE — Discharge Summary (Signed)
[facility] Discharge Summary   Patient ID: Tara Gonzalez MRN: [id number] DOB/AGE: Jan 14, 1996 24 y.o.  Admit date: 06/25/2020 Discharge date: 06/26/2020  Admitting Diagnosis: Abdominal pain, nausea, vomiting, diarrhea Gallstones Possible cholecystitis  Discharge Diagnosis Patient Active Problem List   Diagnosis Date Noted  . Acute cholecystitis 06/25/2020  . Encounter for screening for cervical cancer 09/13/2019  . Encounter for counseling regarding contraception 02/15/2019  . Hyperemesis 05/20/2017  . Grief reaction 09/11/2015  . Hypokalemia 08/01/2015  . Lower abdominal pain 01/21/2013  . Bipolar disorder (HCC) 05/02/2012    Consultants None  Imaging: CT ABDOMEN PELVIS W CONTRAST  Result Date: 06/25/2020 CLINICAL DATA:  Nausea vomiting epigastric pain in this 24 year old female with gallstones discovered on recent sonogram EXAM: CT ABDOMEN AND PELVIS WITH CONTRAST TECHNIQUE: Multidetector CT imaging of the abdomen and pelvis was performed using the standard protocol following bolus administration of intravenous contrast. CONTRAST:  OMNIPAQUE IOHEXOL 300 MG/ML  SOLN COMPARISON:  CT from Jan 20, 2020 and ultrasound 06/23/2020 FINDINGS: Lower chest: Lung bases are clear.  No effusion.  No consolidation. Hepatobiliary: No focal, suspicious hepatic lesion. Gallbladder moderately distended measuring approximately 8 cm greatest craniocaudal extent. There is no gallbladder wall thickening or evidence of pericholecystic stranding. No biliary duct dilation. Portal vein is patent. Pancreas: Pancreas is normal without ductal dilation or sign of inflammation. Spleen: Spleen normal size and contour.  No focal splenic lesion. Adrenals/Urinary Tract: Adrenal glands are normal. Symmetric renal enhancement. No hydronephrosis. Urinary bladder under distended, without focal abnormality. Stomach/Bowel: Colon is under distended limiting assessment. The appendix is normal. Small-bowel  nondilated. No perigastric stranding. Question mild antral thickening versus under distension. Vascular/Lymphatic: Vascular structures in the abdomen are patent. Aorta is of normal caliber. There is no gastrohepatic or hepatoduodenal ligament lymphadenopathy. No retroperitoneal or mesenteric lymphadenopathy. No pelvic sidewall lymphadenopathy. Reproductive: Tampon in the vagina. CT appearance of uterus and adnexa unremarkable. Other: No free air.  No ascites. Musculoskeletal: No acute musculoskeletal process. IMPRESSION: 1. Question mild gastric antral thickening versus under distension. Correlate with any symptoms of gastritis. 2. Question mild wall thickening versus under distension of the colon. No pericolonic stranding or increased vascularity. Correlate with risk factors for colitis such as recent antibiotic administration. Findings likely due to under distension. 3. Moderate distension of the gallbladder without pericholecystic stranding. 4. Normal appendix Electronically Signed   By: Donzetta Kohut M.D.   On: 06/25/2020 10:44    Procedures Dr. Sheliah Hatch (06/25/2020) - Laparoscopic Cholecystectomy  Hospital Course:  Tara Gonzalez is a 24yo female who presented to [facility] 10/21 with Vague abdominal pain for 1 week with gallstones and leukocytosis and nausea.  Workup showed a distended gallbladder on CT scan, normal LFTs.  While symptoms were atypical it was felt best course of action to proceed to the OR for gallbladder removal with general inspection of the abdomen. Intraoperatively the gallbladder was blue with minimal signs of inflammation, no other apparent source of abdominal pain. Cholecystectomy performed. Tolerated procedure well and was transferred to the floor. Postoperatively her symptoms improved. Diet was advanced as tolerated.  On POD1 the patient was voiding well, tolerating diet, ambulating well, pain well controlled, vital signs stable, incisions c/d/i and felt stable for discharge home.   Patient will follow up as below and knows to call with questions or concerns.    I have personally reviewed the patients medication history on the [place] controlled substance database.    Physical Exam: General:  Alert, NAD, pleasant, comfortable Pulm: rate  and effort normal Abd:  Soft, ND, appropriately tender, multiple lap incisions C/D/I, +BS  Allergies as of 06/26/2020   No Known Allergies     Medication List    STOP taking these medications   HYDROcodone-acetaminophen 5-325 MG tablet Commonly known as: NORCO/VICODIN   polyethylene glycol powder 17 GM/SCOOP powder Commonly known as: GLYCOLAX/MIRALAX Replaced by: polyethylene glycol 17 g packet   promethazine 25 MG tablet Commonly known as: PHENERGAN     TAKE these medications   acetaminophen 325 MG tablet Commonly known as: TYLENOL Take 2 tablets (650 mg total) by mouth every 6 (six) hours as needed for mild pain.   ibuprofen 100 MG tablet Commonly known as: ADVIL Take 4 tablets (400 mg total) by mouth every 6 (six) hours as needed for mild pain.   methocarbamol 500 MG tablet Commonly known as: ROBAXIN Take 1 tablet (500 mg total) by mouth every 6 (six) hours as needed for muscle spasms.   ondansetron 4 MG disintegrating tablet Commonly known as: ZOFRAN-ODT Take 1 tablet (4 mg total) by mouth every 6 (six) hours as needed for nausea. What changed:   when to take this  reasons to take this  Another medication with the same name was removed. Continue taking this medication, and follow the directions you see here.   oxyCODONE 5 MG immediate release tablet Commonly known as: Oxy IR/ROXICODONE Take 1 tablet (5 mg total) by mouth every 6 (six) hours as needed for severe pain.   polyethylene glycol 17 g packet Commonly known as: MIRALAX / GLYCOLAX Take 17 g by mouth daily as needed for mild constipation. Replaces: polyethylene glycol powder 17 GM/SCOOP powder         Follow-up Information    [facility] Call.   Specialty: General Surgery Why: We are working on your appoinment, call to confirm Please arrive 30 minutes prior to your appointment to check in and fill out paperwork. Bring photo ID and Insurance account manager information: [address] [place] [address] [phone]              Signed: Franne Forts, [facility] 06/26/2020, 8:27 AM Please see Amion for pager number during day hours 7:00am-4:30pm

## 2020-06-29 ENCOUNTER — Telehealth: Payer: Self-pay

## 2020-06-29 LAB — SURGICAL PATHOLOGY

## 2020-06-29 NOTE — Telephone Encounter (Signed)
06/25/2020 Tara Gonzalez   Transition Care Management Unsuccessful Follow-up Telephone Call  Date of discharge and from where:  06/25/2020 Tara Gonzalez   Attempts:  1st Attempt  Reason for unsuccessful TCM follow-up call:  Unable to leave message

## 2020-09-03 ENCOUNTER — Ambulatory Visit: Payer: Medicaid Other | Admitting: Family Medicine
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
## 2020-09-30 ENCOUNTER — Ambulatory Visit (INDEPENDENT_AMBULATORY_CARE_PROVIDER_SITE_OTHER): Payer: Medicaid Other | Admitting: Family Medicine

## 2020-09-30 ENCOUNTER — Encounter: Payer: Self-pay | Admitting: Family Medicine

## 2020-09-30 ENCOUNTER — Other Ambulatory Visit: Payer: Self-pay

## 2020-09-30 ENCOUNTER — Other Ambulatory Visit (HOSPITAL_COMMUNITY)
Admission: RE | Admit: 2020-09-30 | Discharge: 2020-09-30 | Disposition: A | Discharge status: Home / Self Care | Payer: Medicaid Other | Source: Ambulatory Visit | Attending: Family Medicine | Admitting: Family Medicine

## 2020-09-30 VITALS — BP 125/80 | HR 84 | Ht 62.0 in | Wt 128.4 lb

## 2020-09-30 DIAGNOSIS — N926 Irregular menstruation, unspecified: Secondary | ICD-10-CM | POA: Diagnosis not present

## 2020-09-30 DIAGNOSIS — Z202 Contact with and (suspected) exposure to infections with a predominantly sexual mode of transmission: Secondary | ICD-10-CM | POA: Insufficient documentation

## 2020-09-30 LAB — POCT URINE PREGNANCY: Preg Test, Ur: NEGATIVE

## 2020-09-30 NOTE — Patient Instructions (Signed)
It was a pleasure to see you today!  Thank you for choosing [facility] for your primary care.   Our plans for today were:  I will call you with the results  Best Wishes,   Orpah Cobb, DO

## 2020-09-30 NOTE — Progress Notes (Signed)
   Subjective:   Patient ID: Tara Gonzalez    DOB: 05/22/1996, 25 y.o. female   MRN: [id number]  Falisha Osment is a 25 y.o. female with a history of acute cholecystitis, hyperemesis, bipolar disorder, h/o hypokalemia, grief reaction here for STD Check and gallbladder  STD Screen: Patient notes that she is sexually active with one female partner however this partner has been unfaithful.  This partner has been to recently diagnosed with trichomonas.  She is here today to get STD testing.  Her last menstrual period was 08/19/2020.  She does not use condoms every time.  She requests urine pregnancy test.  Review of Systems:  Per HPI.   Objective:   BP 125/80   Pulse 84   Ht 5\' 2"  (1.575 m)   Wt 128 lb 6.4 oz (58.2 kg)   SpO2 99%   BMI 23.48 kg/m  Vitals and nursing note reviewed.  General: pleasant young female, sitting comfortably on exam bed, well nourished, well developed, in no acute distress with non-toxic appearance Resp: breathing comfortably on room air, speaking in full sentences Skin: warm, dry Extremities: warm and well perfused MSK: gait normal Neuro: Alert and oriented, speech normal Pelvic exam: VULVA: normal appearing vulva with no masses, tenderness or lesions, VAGINA: normal appearing vagina with normal color, no lesions, vaginal discharge - green, milky and thin, CERVIX: normal appearing cervix or lesions, cervical discharge present - green, milky and thin, exam chaperoned by .  Assessment & Plan:   Exposure to STD Recent exposure to partner positive with Trichomonas. STD testing obtained today (GC/CL/Tric/HIV/RPR/Hep C). Encouraged to avoid sexual intercourse until patient (if indicated) and partner have been adequately treated.  Encouraged condom use to avoid STD exposures in the future.  Patient would benefit from birth control if sexually active without protection.  Recommend follow-up with PCP to further discuss. -Follow-up STD testing, will treat if  indicated - urine pregnancy test negative today  Orders Placed This Encounter  Procedures  . HIV Antibody (routine testing w rflx)  . RPR  . Hepatitis C antibody  . POCT urine pregnancy   No orders of the defined types were placed in this encounter.   Gilberto Better, DO PGY-3, [facility] 09/30/2020 6:24 PM

## 2020-09-30 NOTE — Assessment & Plan Note (Signed)
Recent exposure to partner positive with Trichomonas. STD testing obtained today (GC/CL/Tric/HIV/RPR/Hep C). Encouraged to avoid sexual intercourse until patient (if indicated) and partner have been adequately treated.  Encouraged condom use to avoid STD exposures in the future.  Patient would benefit from birth control if sexually active without protection.  Recommend follow-up with PCP to further discuss. -Follow-up STD testing, will treat if indicated - urine pregnancy test negative today

## 2020-10-01 LAB — CERVICOVAGINAL ANCILLARY ONLY
Bacterial Vaginitis (gardnerella): POSITIVE — AB
Candida Glabrata: NEGATIVE
Candida Vaginitis: POSITIVE — AB
Chlamydia: NEGATIVE
Comment: NEGATIVE
Comment: NEGATIVE
Comment: NEGATIVE
Comment: NEGATIVE
Comment: NEGATIVE
Comment: NORMAL
Neisseria Gonorrhea: NEGATIVE
Trichomonas: POSITIVE — AB

## 2020-10-01 LAB — HIV ANTIBODY (ROUTINE TESTING W REFLEX): HIV Screen 4th Generation wRfx: NONREACTIVE

## 2020-10-01 LAB — HEPATITIS C ANTIBODY: Hep C Virus Ab: 0.1 s/co ratio (ref 0.0–0.9)

## 2020-10-01 LAB — RPR: RPR Ser Ql: NONREACTIVE

## 2020-10-02 ENCOUNTER — Other Ambulatory Visit: Payer: Self-pay | Admitting: Family Medicine

## 2020-10-02 DIAGNOSIS — A599 Trichomoniasis, unspecified: Secondary | ICD-10-CM

## 2020-10-02 DIAGNOSIS — B9689 Other specified bacterial agents as the cause of diseases classified elsewhere: Secondary | ICD-10-CM

## 2020-10-02 DIAGNOSIS — B373 Candidiasis of vulva and vagina: Secondary | ICD-10-CM

## 2020-10-02 DIAGNOSIS — B3731 Acute candidiasis of vulva and vagina: Secondary | ICD-10-CM

## 2020-10-02 MED ORDER — FLUCONAZOLE 150 MG PO TABS: ORAL_TABLET | ORAL | 0 refills | Status: DC

## 2020-10-02 MED ORDER — METRONIDAZOLE 500 MG PO TABS: 500.0000 mg | ORAL_TABLET | Freq: Two times a day (BID) | ORAL | 0 refills | Status: AC

## 2021-12-25 ENCOUNTER — Other Ambulatory Visit: Payer: Self-pay

## 2021-12-25 ENCOUNTER — Encounter (HOSPITAL_COMMUNITY): Payer: Self-pay | Admitting: Emergency Medicine

## 2021-12-25 ENCOUNTER — Emergency Department (HOSPITAL_COMMUNITY)
Admission: EM | Admit: 2021-12-25 | Discharge: 2021-12-25 | Disposition: A | Discharge status: Home / Self Care | Payer: Medicaid Other | Attending: Emergency Medicine | Admitting: Emergency Medicine

## 2021-12-25 DIAGNOSIS — X58XXXA Exposure to other specified factors, initial encounter: Secondary | ICD-10-CM | POA: Insufficient documentation

## 2021-12-25 DIAGNOSIS — S8001XA Contusion of right knee, initial encounter: Secondary | ICD-10-CM | POA: Insufficient documentation

## 2021-12-25 DIAGNOSIS — T40724A Poisoning by synthetic cannabinoids, undetermined, initial encounter: Secondary | ICD-10-CM | POA: Diagnosis not present

## 2021-12-25 DIAGNOSIS — F10120 Alcohol abuse with intoxication, uncomplicated: Secondary | ICD-10-CM | POA: Diagnosis not present

## 2021-12-25 DIAGNOSIS — R4182 Altered mental status, unspecified: Secondary | ICD-10-CM | POA: Diagnosis not present

## 2021-12-25 DIAGNOSIS — R112 Nausea with vomiting, unspecified: Secondary | ICD-10-CM

## 2021-12-25 DIAGNOSIS — F10920 Alcohol use, unspecified with intoxication, uncomplicated: Secondary | ICD-10-CM

## 2021-12-25 DIAGNOSIS — Y904 Blood alcohol level of 80-99 mg/100 ml: Secondary | ICD-10-CM | POA: Diagnosis not present

## 2021-12-25 DIAGNOSIS — R5381 Other malaise: Secondary | ICD-10-CM | POA: Diagnosis not present

## 2021-12-25 DIAGNOSIS — R1084 Generalized abdominal pain: Secondary | ICD-10-CM | POA: Diagnosis not present

## 2021-12-25 DIAGNOSIS — S8991XA Unspecified injury of right lower leg, initial encounter: Secondary | ICD-10-CM | POA: Diagnosis present

## 2021-12-25 LAB — COMPREHENSIVE METABOLIC PANEL
ALT: 30 U/L (ref 0–44)
AST: 33 U/L (ref 15–41)
Albumin: 4.8 g/dL (ref 3.5–5.0)
Alkaline Phosphatase: 61 U/L (ref 38–126)
Anion gap: 11 (ref 5–15)
BUN: 14 mg/dL (ref 6–20)
CO2: 20 mmol/L — ABNORMAL LOW (ref 22–32)
Calcium: 9.3 mg/dL (ref 8.9–10.3)
Chloride: 109 mmol/L (ref 98–111)
Creatinine, Ser: 0.64 mg/dL (ref 0.44–1.00)
GFR, Estimated: >60 mL/min (ref >60)
Glucose, Bld: 89 mg/dL (ref 70–99)
Potassium: 4.3 mmol/L (ref 3.5–5.1)
Sodium: 140 mmol/L (ref 135–145)
Total Bilirubin: 1.1 mg/dL (ref 0.3–1.2)
Total Protein: 8.3 g/dL — ABNORMAL HIGH (ref 6.5–8.1)

## 2021-12-25 LAB — LIPASE, BLOOD: Lipase: 33 U/L (ref 11–51)

## 2021-12-25 LAB — RAPID URINE DRUG SCREEN, HOSP PERFORMED
Amphetamines: NOT DETECTED
Barbiturates: NOT DETECTED
Benzodiazepines: NOT DETECTED
Cocaine: NOT DETECTED
Opiates: NOT DETECTED
Tetrahydrocannabinol: POSITIVE — AB

## 2021-12-25 LAB — CBC WITH DIFFERENTIAL/PLATELET
Abs Immature Granulocytes: 0.03 10*3/uL (ref 0.00–0.07)
Basophils Absolute: 0 10*3/uL (ref 0.0–0.1)
Basophils Relative: 0 %
Eosinophils Absolute: 0 10*3/uL (ref 0.0–0.5)
Eosinophils Relative: 0 %
HCT: 39.5 % (ref 36.0–46.0)
Hemoglobin: 13.6 g/dL (ref 12.0–15.0)
Immature Granulocytes: 0 %
Lymphocytes Relative: 20 %
Lymphs Abs: 2.1 10*3/uL (ref 0.7–4.0)
MCH: 31.5 pg (ref 26.0–34.0)
MCHC: 34.4 g/dL (ref 30.0–36.0)
MCV: 91.4 fL (ref 80.0–100.0)
Monocytes Absolute: 0.8 10*3/uL (ref 0.1–1.0)
Monocytes Relative: 8 %
Neutro Abs: 7.6 10*3/uL (ref 1.7–7.7)
Neutrophils Relative %: 72 %
Platelets: 220 10*3/uL (ref 150–400)
RBC: 4.32 MIL/uL (ref 3.87–5.11)
RDW: 13.4 % (ref 11.5–15.5)
WBC: 10.5 10*3/uL (ref 4.0–10.5)
nRBC: 0 % (ref 0.0–0.2)

## 2021-12-25 LAB — URINALYSIS, ROUTINE W REFLEX MICROSCOPIC
Bacteria, UA: NONE SEEN
Bilirubin Urine: NEGATIVE
Glucose, UA: NEGATIVE mg/dL
Hgb urine dipstick: NEGATIVE
Ketones, ur: 20 mg/dL — AB
Leukocytes,Ua: NEGATIVE
Nitrite: NEGATIVE
Protein, ur: NEGATIVE mg/dL
Specific Gravity, Urine: 1.015 (ref 1.005–1.030)
pH: 8 (ref 5.0–8.0)

## 2021-12-25 LAB — ETHANOL: Alcohol, Ethyl (B): 88 mg/dL — ABNORMAL HIGH (ref <10)

## 2021-12-25 MED ORDER — CAPSAICIN 0.075 % EX CREA
TOPICAL_CREAM | Freq: Two times a day (BID) | CUTANEOUS | Status: DC
  Filled 2021-12-25: qty 57

## 2021-12-25 MED ORDER — ONDANSETRON HCL 4 MG/2ML IJ SOLN
4.0000 mg | Freq: Once | INTRAMUSCULAR | Status: AC
  Administered 2021-12-25: 4 mg via INTRAVENOUS
  Filled 2021-12-25: qty 2

## 2021-12-25 MED ORDER — SODIUM CHLORIDE 0.9 % IV BOLUS
1000.0000 mL | Freq: Once | INTRAVENOUS | Status: AC
  Administered 2021-12-25: 1000 mL via INTRAVENOUS

## 2021-12-25 MED ORDER — HALOPERIDOL LACTATE 5 MG/ML IJ SOLN
2.0000 mg | Freq: Once | INTRAMUSCULAR | Status: AC
  Administered 2021-12-25: 2 mg via INTRAVENOUS
  Filled 2021-12-25: qty 1

## 2021-12-25 MED ORDER — ONDANSETRON 4 MG PO TBDP: 4.0000 mg | ORAL_TABLET | Freq: Three times a day (TID) | ORAL | 0 refills | Status: DC | PRN

## 2021-12-25 NOTE — ED Provider Notes (Signed)
?[facility]-EMERGENCY DEPT ?Provider Note ? ? ?CSN: [id number] ?Arrival date & time: 12/25/21  0944 ? ?  ? ?History ? ?Chief Complaint  ?Patient presents with  ? Alcohol Intoxication  ? ? ?Tara Gonzalez is a 26 y.o. female. ? ?26 year old female brought in by EMS, called to a gas station where patient was in an [place], vomiting out the side of the car. Patient reports drinking "a lot" does not confirm or deny substance use. Patient complains of feeling like she is dying, has pain in her right knee and found to have a bruise, no known injuries.  ? ? ?  ? ?Home Medications ?Prior to Admission medications   ?Medication Sig Start Date End Date Taking? Authorizing Provider  ?ondansetron (ZOFRAN-ODT) 4 MG disintegrating tablet Take 1 tablet (4 mg total) by mouth every 8 (eight) hours as needed for nausea or vomiting. 12/25/21  Yes Jeannie Fend, PA-C  ?acetaminophen (TYLENOL) 325 MG tablet Take 2 tablets (650 mg total) by mouth every 6 (six) hours as needed for mild pain. 06/26/20   Meuth, Brooke A, PA-C  ?fluconazole (DIFLUCAN) 150 MG tablet Take 1 tablet now, then 1 tablet in 72 hours 10/02/20   Mullis, Kiersten P, DO  ?ibuprofen (ADVIL) 100 MG tablet Take 4 tablets (400 mg total) by mouth every 6 (six) hours as needed for mild pain. 06/26/20   Meuth, Brooke A, PA-C  ?methocarbamol (ROBAXIN) 500 MG tablet Take 1 tablet (500 mg total) by mouth every 6 (six) hours as needed for muscle spasms. 06/26/20   Meuth, Brooke A, PA-C  ?oxyCODONE (OXY IR/ROXICODONE) 5 MG immediate release tablet Take 1 tablet (5 mg total) by mouth every 6 (six) hours as needed for severe pain. 06/26/20   Meuth, Brooke A, PA-C  ?polyethylene glycol (MIRALAX / GLYCOLAX) 17 g packet Take 17 g by mouth daily as needed for mild constipation. 06/26/20   Meuth, Lina Sar, PA-C  ?   ? ?Allergies    ?Patient has no known allergies.   ? ?Review of Systems   ?Review of Systems  ?Unable to perform ROS: Other (intoxicated, not willing to answer  many questions)  ? ?Physical Exam ?Updated Vital Signs ?BP (!) 103/58   Pulse (!) 32   Resp 16   Ht 5\' 1"  (1.549 m)   Wt 59 kg   SpO2 100%   BMI 24.56 kg/m?  ?Physical Exam ?Vitals and nursing note reviewed.  ?Constitutional:   ?   General: She is not in acute distress. ?   Appearance: She is well-developed. She is not diaphoretic.  ?   Comments: Grimacing in stretcher, verbally acknowledges names   ?HENT:  ?   Head: Normocephalic and atraumatic.  ?   Mouth/Throat:  ?   Mouth: Mucous membranes are moist.  ?Eyes:  ?   Conjunctiva/sclera: Conjunctivae normal.  ?   Pupils: Pupils are equal, round, and reactive to light.  ?Cardiovascular:  ?   Rate and Rhythm: Normal rate and regular rhythm.  ?   Heart sounds: Normal heart sounds.  ?Pulmonary:  ?   Effort: Pulmonary effort is normal.  ?   Breath sounds: Normal breath sounds.  ?Abdominal:  ?   Palpations: Abdomen is soft.  ?   Tenderness: There is no abdominal tenderness.  ?Musculoskeletal:     ?   General: Tenderness present. No swelling or deformity. Normal range of motion.  ?   Cervical back: Neck supple. No tenderness.  ?  Comments: Minor bruise to right knee, normal ROM without pain, is able to straight leg raise both legs  ?Skin: ?   General: Skin is warm and dry.  ?   Findings: Bruising present. No erythema or rash.  ?Neurological:  ?   General: No focal deficit present.  ?   GCS: GCS eye subscore is 3. GCS verbal subscore is 5. GCS motor subscore is 6.  ?   Sensory: No sensory deficit.  ?   Motor: No weakness.  ?Psychiatric:     ?   Behavior: Behavior is withdrawn.  ? ? ?ED Results / Procedures / Treatments   ?Labs ?(all labs ordered are listed, but only abnormal results are displayed) ?Labs Reviewed  ?COMPREHENSIVE METABOLIC PANEL - Abnormal; Notable for the following components:  ?    Result Value  ? CO2 20 (*)   ? Total Protein 8.3 (*)   ? All other components within normal limits  ?ETHANOL - Abnormal; Notable for the following components:  ? Alcohol,  Ethyl (B) 88 (*)   ? All other components within normal limits  ?URINALYSIS, ROUTINE W REFLEX MICROSCOPIC - Abnormal; Notable for the following components:  ? APPearance TURBID (*)   ? Ketones, ur 20 (*)   ? All other components within normal limits  ?RAPID URINE DRUG SCREEN, HOSP PERFORMED - Abnormal; Notable for the following components:  ? Tetrahydrocannabinol POSITIVE (*)   ? All other components within normal limits  ?CBC WITH DIFFERENTIAL/PLATELET  ?LIPASE, BLOOD  ?I-STAT BETA HCG BLOOD, ED ([facility], [facility], [facility] ONLY)  ? ? ?EKG ?None ? ?Radiology ?No results found. ? ?Procedures ?Procedures  ? ? ?Medications Ordered in ED ?Medications  ?capsicum (ZOSTRIX) 0.075 % cream ( Topical Given 12/25/21 1416)  ?sodium chloride 0.9 % bolus 1,000 mL (0 mLs Intravenous Stopped 12/25/21 1121)  ?ondansetron [facility](ZOFRAN) injection 4 mg (4 mg Intravenous Given 12/25/21 1033)  ?haloperidol lactate (HALDOL) injection 2 mg (2 mg Intravenous Given 12/25/21 1223)  ? ? ?ED Course/ Medical Decision Making/ A&P ?  ?                        ?Medical Decision Making ?Amount and/or Complexity of Data Reviewed ?Labs: ordered. ? ?Risk ?OTC drugs. ?Prescription drug management. ? ? ?This patient presents to the ED for concern of nausea and vomiting, this involves an extensive number of treatment options, and is a complaint that carries with it a high risk of complications and morbidity.  The differential diagnosis includes but not limited to gastritis, ingestion of alcohol or other substance, cannabinoid hyperemesis, pregnancy ? ? ?Co morbidities that complicate the patient evaluation ? ?Bipolar disorder, anxiety, depression, anemia ? ? ?Additional history obtained: ? ?Additional history obtained from EMS, as above ?External records from outside source obtained and reviewed including prior UDS positive for marijuana ? ? ?Lab Tests: ? ?I Ordered, and personally interpreted labs.  The pertinent results include: UDS is positive for marijuana, lipase normal,  alcohol elevated at 88.  CBC normal, metabolic panel without significant abnormality, normal renal function.  hCG negative.  Urinalysis is positive for ketones without evidence of infection. ? ?Problem List / ED Course / Critical interventions / Medication management ? ?26 year old female brought in by EMS from a gas station where her [employer] called as patient was vomiting.  Patient admits to drinking lots of liquor.  Also reports her hands feeling numb and like she is about to die.  Patient does have a  small bruise to her right knee which she states is tender but has full range of motion able to straight leg raise both legs.  Labs were assessed, patient was given IV fluids and medication with her vomiting, symptoms improved and patient is ready for discharge. ?I ordered medication including zorfan  for vomiting  ?Reevaluation of the patient after these medicines showed that the patient stayed the same ?Then added haldol with resolution of vomiting, remains nauseous, will add capsaicin prior to discharge ?I have reviewed the patients home medicines and have made adjustments as needed ? ? ?Social Determinants of Health: ? ?Social history updated to include alcohol and marijuana use (last marijuana use was reported to be 3 weeks ago) ? ? ?Test / Admission - Considered: ? ?Admission considered however vomiting resolved and is able to tolerate POs, will dc with rx for zofran ODT ? ? ? ? ? ? ? ? ?Final Clinical Impression(s) / ED Diagnoses ?Final diagnoses:  ?Alcoholic intoxication without complication (HCC)  ?Cannabinoid hyperemesis syndrome  ? ? ?Rx / DC Orders ?ED Discharge Orders   ? ?      Ordered  ?  ondansetron (ZOFRAN-ODT) 4 MG disintegrating tablet  Every 8 hours PRN       ? 12/25/21 1412  ? ?  ?  ? ?  ? ? ?  ?Jeannie Fend, PA-C ?12/25/21 1419 ? ?  ?Linwood Dibbles, MD ?12/25/21 1529 ? ?

## 2021-12-25 NOTE — ED Triage Notes (Signed)
Pt to ER via EMS from [place] station where an [place] driver called for pt as she was intoxicated and vomiting.  Pt admits to "a lot" of tequila.  Family was on scene and should be coming to ER.  Pt vices no other c/o at this time.  VSS, CBG 113. ?

## 2021-12-27 ENCOUNTER — Telehealth: Payer: Self-pay

## 2021-12-27 LAB — I-STAT BETA HCG BLOOD, ED (MC, WL, AP ONLY): I-stat hCG, quantitative: <5 m[IU]/mL (ref <5)

## 2021-12-27 NOTE — Telephone Encounter (Signed)
Transition Care Management Unsuccessful Follow-up Telephone Call ? ?Date of discharge and from where:  12/25/2021 from [facility] ? ?Attempts:  1st Attempt ? ?Reason for unsuccessful TCM follow-up call:  Left voice message ? ? ? ?

## 2021-12-28 NOTE — Telephone Encounter (Signed)
Transition Care Management Unsuccessful Follow-up Telephone Call ? ?Date of discharge and from where:  12/25/2021-[facility] ? ?Attempts:  2nd Attempt ? ?Reason for unsuccessful TCM follow-up call:  Left voice message ? ?  ?

## 2021-12-29 NOTE — Telephone Encounter (Signed)
Transition Care Management Unsuccessful Follow-up Telephone Call ? ?Date of discharge and from where:  12/25/2021-[facility] ? ?Attempts:  3rd Attempt ? ?Reason for unsuccessful TCM follow-up call:  Left voice message ? ?  ?

## 2022-02-08 ENCOUNTER — Encounter: Payer: Self-pay | Admitting: *Deleted

## 2022-07-31 ENCOUNTER — Other Ambulatory Visit: Payer: Self-pay

## 2022-07-31 ENCOUNTER — Encounter: Payer: Self-pay | Admitting: Emergency Medicine

## 2022-07-31 ENCOUNTER — Emergency Department: Payer: Medicaid Other

## 2022-07-31 ENCOUNTER — Emergency Department
Admission: EM | Admit: 2022-07-31 | Discharge: 2022-07-31 | Disposition: A | Discharge status: Home / Self Care | Payer: Medicaid Other | Attending: Emergency Medicine | Admitting: Emergency Medicine

## 2022-07-31 DIAGNOSIS — R109 Unspecified abdominal pain: Secondary | ICD-10-CM | POA: Diagnosis not present

## 2022-07-31 DIAGNOSIS — R112 Nausea with vomiting, unspecified: Secondary | ICD-10-CM | POA: Diagnosis not present

## 2022-07-31 DIAGNOSIS — E860 Dehydration: Secondary | ICD-10-CM | POA: Diagnosis not present

## 2022-07-31 DIAGNOSIS — A084 Viral intestinal infection, unspecified: Secondary | ICD-10-CM | POA: Diagnosis not present

## 2022-07-31 DIAGNOSIS — T68XXXA Hypothermia, initial encounter: Secondary | ICD-10-CM | POA: Diagnosis not present

## 2022-07-31 DIAGNOSIS — R9431 Abnormal electrocardiogram [ECG] [EKG]: Secondary | ICD-10-CM | POA: Diagnosis not present

## 2022-07-31 DIAGNOSIS — I959 Hypotension, unspecified: Secondary | ICD-10-CM | POA: Diagnosis not present

## 2022-07-31 DIAGNOSIS — R0689 Other abnormalities of breathing: Secondary | ICD-10-CM | POA: Diagnosis not present

## 2022-07-31 LAB — CBC
HCT: 39.5 % (ref 36.0–46.0)
Hemoglobin: 13.3 g/dL (ref 12.0–15.0)
MCH: 30.7 pg (ref 26.0–34.0)
MCHC: 33.7 g/dL (ref 30.0–36.0)
MCV: 91.2 fL (ref 80.0–100.0)
Platelets: 217 10*3/uL (ref 150–400)
RBC: 4.33 MIL/uL (ref 3.87–5.11)
RDW: 13.1 % (ref 11.5–15.5)
WBC: 10.1 10*3/uL (ref 4.0–10.5)
nRBC: 0 % (ref 0.0–0.2)

## 2022-07-31 LAB — LIPASE, BLOOD: Lipase: 26 U/L (ref 11–51)

## 2022-07-31 LAB — URINALYSIS, ROUTINE W REFLEX MICROSCOPIC
Bilirubin Urine: NEGATIVE
Glucose, UA: NEGATIVE mg/dL
Hgb urine dipstick: NEGATIVE
Ketones, ur: NEGATIVE mg/dL
Leukocytes,Ua: NEGATIVE
Nitrite: NEGATIVE
Protein, ur: 100 mg/dL — AB
Specific Gravity, Urine: 1.024 (ref 1.005–1.030)
pH: 8 (ref 5.0–8.0)

## 2022-07-31 LAB — COMPREHENSIVE METABOLIC PANEL
ALT: 29 U/L (ref 0–44)
AST: 36 U/L (ref 15–41)
Albumin: 4 g/dL (ref 3.5–5.0)
Alkaline Phosphatase: 60 U/L (ref 38–126)
Anion gap: 8 (ref 5–15)
BUN: 18 mg/dL (ref 6–20)
CO2: 21 mmol/L — ABNORMAL LOW (ref 22–32)
Calcium: 8.2 mg/dL — ABNORMAL LOW (ref 8.9–10.3)
Chloride: 111 mmol/L (ref 98–111)
Creatinine, Ser: 0.69 mg/dL (ref 0.44–1.00)
GFR, Estimated: >60 mL/min (ref >60)
Glucose, Bld: 104 mg/dL — ABNORMAL HIGH (ref 70–99)
Potassium: 4.1 mmol/L (ref 3.5–5.1)
Sodium: 140 mmol/L (ref 135–145)
Total Bilirubin: 1.2 mg/dL (ref 0.3–1.2)
Total Protein: 6.9 g/dL (ref 6.5–8.1)

## 2022-07-31 LAB — POC URINE PREG, ED: Preg Test, Ur: NEGATIVE

## 2022-07-31 MED ORDER — ONDANSETRON 4 MG PO TBDP: 4.0000 mg | ORAL_TABLET | Freq: Three times a day (TID) | ORAL | 0 refills | Status: DC | PRN

## 2022-07-31 MED ORDER — MORPHINE SULFATE (PF) 4 MG/ML IV SOLN
4.0000 mg | Freq: Once | INTRAVENOUS | Status: AC
  Administered 2022-07-31: 4 mg via INTRAVENOUS
  Filled 2022-07-31: qty 1

## 2022-07-31 MED ORDER — ONDANSETRON HCL 4 MG/2ML IJ SOLN
4.0000 mg | Freq: Once | INTRAMUSCULAR | Status: AC
  Administered 2022-07-31: 4 mg via INTRAVENOUS
  Filled 2022-07-31: qty 2

## 2022-07-31 MED ORDER — IOHEXOL 350 MG/ML SOLN
75.0000 mL | Freq: Once | INTRAVENOUS | Status: AC | PRN
  Administered 2022-07-31: 75 mL via INTRAVENOUS

## 2022-07-31 MED ORDER — KETOROLAC TROMETHAMINE 30 MG/ML IJ SOLN
30.0000 mg | Freq: Once | INTRAMUSCULAR | Status: AC
  Administered 2022-07-31: 30 mg via INTRAVENOUS

## 2022-07-31 MED ORDER — KETOROLAC TROMETHAMINE 30 MG/ML IJ SOLN
30.0000 mg | Freq: Once | INTRAMUSCULAR | Status: DC
  Filled 2022-07-31: qty 1

## 2022-07-31 MED ORDER — SODIUM CHLORIDE 0.9 % IV BOLUS
1000.0000 mL | Freq: Once | INTRAVENOUS | Status: AC
  Administered 2022-07-31: 1000 mL via INTRAVENOUS

## 2022-07-31 NOTE — Discharge Instructions (Addendum)
Follow-up with your primary care provider if any continued problems, urgent care or return to the emergency department if any severe worsening of your symptoms.  A prescription for Zofran was sent to the pharmacy to take every 8 hours as needed for nausea and vomiting.  Remain on clear liquids the remainder of the next 24 hours and slowly advance her diet to crackers, plain toast, rice and applesauce.  If this is staying down in another 24 hours slowly advance her diet as tolerated.

## 2022-07-31 NOTE — ED Provider Notes (Addendum)
[facility] Provider Note    Event Date/Time   First MD Initiated Contact with Patient 07/31/22 [phone]     (approximate)   History   Nausea and Emesis   HPI  Tara Gonzalez is a 26 y.o. female   presents to the ED via EMS with complaint of nausea and vomiting for the last 3 days.  Patient reports diarrhea once.  She is unaware of any fever and denies chills.  Patient was taking Zofran but complained of continued vomiting and mid abdominal pain.  The Zofran was prescribed for her sometime ago.  Patient has had a cholecystectomy but is unsure of her appendix.  Has a history of bipolar disorder, hyperemesis and lower abdominal pain.      Physical Exam   Triage Vital Signs: ED Triage Vitals  Enc Vitals Group     BP 07/31/22 0756 96/66     Pulse Rate 07/31/22 0756 76     Resp 07/31/22 0756 18     Temp 07/31/22 0756 98.7 F (37.1 C)     Temp Source 07/31/22 0756 Oral     SpO2 07/31/22 0756 98 %     Weight 07/31/22 0757 135 lb (61.2 kg)     Height 07/31/22 0757 5\' 2"  (1.575 m)     Head Circumference --      Peak Flow --      Pain Score 07/31/22 0757 8     Pain Loc --      Pain Edu? --      Excl. in GC? --     Most recent vital signs: Vitals:   07/31/22 1205 07/31/22 1239  BP: 98/63 100/60  Pulse: 63 60  Resp: 18 18  Temp: 98.3 F (36.8 C)   SpO2: 98% 99%     General: Awake, no distress.  CV:  Good peripheral perfusion.  Resp:  Normal effort.  Abd:  No distention.  Soft, diffusely tender throughout, bowel sounds are normoactive x 4 quadrants.  No point tenderness, rebound or referred pain was noted. Other:     ED Results / Procedures / Treatments   Labs (all labs ordered are listed, but only abnormal results are displayed) Labs Reviewed  COMPREHENSIVE METABOLIC PANEL - Abnormal; Notable for the following components:      Result Value   CO2 21 (*)    Glucose, Bld 104 (*)    Calcium 8.2 (*)    All other components within normal limits   URINALYSIS, ROUTINE W REFLEX MICROSCOPIC - Abnormal; Notable for the following components:   Color, Urine YELLOW (*)    APPearance HAZY (*)    Protein, ur 100 (*)    Bacteria, UA RARE (*)    All other components within normal limits  LIPASE, BLOOD  CBC  POC URINE PREG, ED     EKG  Per EKG read out Vent. rate 73 BPM PR interval * ms QRS duration 94 ms QT/QTcB 386/425 ms P-R-T axes * 81 72 Atrial fibrillation with a competing junctional pacemaker Abnormal ECG When compared with ECG of 18-Jan-2020 09:54, PREVIOUS ECG IS PRESENT    RADIOLOGY CT scan abdomen pelvis with contrast per radiologist was negative for any acute changes that would suggest reasons for patient's pain.    PROCEDURES:  Critical Care performed:   Procedures   MEDICATIONS ORDERED IN ED: Medications  sodium chloride 0.9 % bolus 1,000 mL (0 mLs Intravenous Stopped 07/31/22 0935)  morphine (PF) 4 MG/ML injection 4  mg (4 mg Intravenous Given 07/31/22 1019)  ondansetron (ZOFRAN) injection 4 mg (4 mg Intravenous Given 07/31/22 1027)  iohexol (OMNIPAQUE) 350 MG/ML injection 75 mL (75 mLs Intravenous Contrast Given 07/31/22 1115)  ketorolac (TORADOL) 30 MG/ML injection 30 mg (30 mg Intravenous Given 07/31/22 1139)     IMPRESSION / MDM / ASSESSMENT AND PLAN / ED COURSE  I reviewed the triage vital signs and the nursing notes.   Differential diagnosis includes, but is not limited to, viral gastroenteritis, abdominal pain, appendicitis but most unlikely, diverticulitis.  26 year old female presents to the ED with complaint of nausea, vomiting and diarrhea that started 3 days ago.  Patient has Zofran IV while coming into the emergency department via EMS but still continues to vomit.  Patient had mid abdominal pain diffusely throughout but no point tenderness, rebound or referred pain was noted during palpation.  Zofran was given again while in the emergency department and no continued vomiting during the  remainder of her ED stay.  Prior to her CT scan patient was given patient was given Zofran IV and morphine 4 mg with resolution of her abdominal pain.  Ultrasound was reassuring with no findings to explain abdominal pain which made it more likely that this is a viral gastroenteritis causing her symptoms.  Patient was hydrated with a liter of fluids and was feeling better prior to discharge.  Patient is aware that a prescription for Zofran is being sent to her pharmacy to take as needed every 8 hours.  She is to continue with clear fluids the remainder of the day and advance her diet as tolerated.  She is to return to the emergency department if any severe worsening of her symptoms or urgent concerns.  Also in looking at patient's EKG that was done today it was read as atrial fibs however I was able to find 2 other EKGs from 2021 that were very similar and was shown and discussed with Dr. Scotty Court who is my attending today.  Dr. Scotty Court reviewed EKG and agrees that today's EKG is not actually atrial fibs and will make a notation for the same.     Patient's presentation is most consistent with acute complicated illness / injury requiring diagnostic workup.  FINAL CLINICAL IMPRESSION(S) / ED DIAGNOSES   Final diagnoses:  Viral gastroenteritis     Rx / DC Orders   ED Discharge Orders          Ordered    ondansetron (ZOFRAN-ODT) 4 MG disintegrating tablet  Every 8 hours PRN        07/31/22 1135    ondansetron (ZOFRAN-ODT) 4 MG disintegrating tablet  Every 8 hours PRN        07/31/22 1209             Note:  This document was prepared using Dragon voice recognition software and may include unintentional dictation errors.   Tommi Rumps, PA-C 07/31/22 1657    Tommi Rumps, PA-C 07/31/22 1659    Sharman Cheek, MD 07/31/22 [phone]

## 2022-07-31 NOTE — ED Triage Notes (Addendum)
Pt BIB ems for Nausea, vomiting and diarrhea since this past Friday. Pt reports taking zofran and still vomiting. Pt also reports mid abdominal pain. Pt reports she has had her gallbladder removed, but unsure about her appendix   20G in the Left AC, 4mg  of zofran given by EMS.  o60f NS  EMS VitalsL HR 82 BP 102/70 CBG 140 99% RA oxygen

## 2022-07-31 NOTE — ED Provider Notes (Signed)
Procedures     ----------------------------------------- 4:51 PM on 07/31/2022 -----------------------------------------  EKG reviewed by me which shows a normal sinus rhythm, rate of 73 with variation in heart rate which I suspect is due to respiratory phasicity.  No underlying dysrhythmia.    Sharman Cheek, MD 07/31/22 1651

## 2022-12-06 ENCOUNTER — Ambulatory Visit: Payer: Medicaid Other | Admitting: Student

## 2023-01-02 ENCOUNTER — Other Ambulatory Visit (HOSPITAL_COMMUNITY)
Admission: RE | Admit: 2023-01-02 | Discharge: 2023-01-02 | Disposition: A | Discharge status: Home / Self Care | Payer: Medicaid Other | Source: Ambulatory Visit | Attending: Family Medicine | Admitting: Family Medicine

## 2023-01-02 ENCOUNTER — Ambulatory Visit (INDEPENDENT_AMBULATORY_CARE_PROVIDER_SITE_OTHER): Payer: Medicaid Other | Admitting: Student

## 2023-01-02 ENCOUNTER — Encounter: Payer: Self-pay | Admitting: Student

## 2023-01-02 VITALS — BP 104/73 | HR 65 | Ht 61.0 in | Wt 130.5 lb

## 2023-01-02 DIAGNOSIS — N760 Acute vaginitis: Secondary | ICD-10-CM | POA: Diagnosis not present

## 2023-01-02 DIAGNOSIS — Z113 Encounter for screening for infections with a predominantly sexual mode of transmission: Secondary | ICD-10-CM | POA: Diagnosis not present

## 2023-01-02 DIAGNOSIS — N926 Irregular menstruation, unspecified: Secondary | ICD-10-CM | POA: Diagnosis not present

## 2023-01-02 DIAGNOSIS — B3731 Acute candidiasis of vulva and vagina: Secondary | ICD-10-CM | POA: Diagnosis not present

## 2023-01-02 DIAGNOSIS — A5901 Trichomonal vulvovaginitis: Secondary | ICD-10-CM

## 2023-01-02 DIAGNOSIS — B9689 Other specified bacterial agents as the cause of diseases classified elsewhere: Secondary | ICD-10-CM | POA: Diagnosis not present

## 2023-01-02 DIAGNOSIS — Z124 Encounter for screening for malignant neoplasm of cervix: Secondary | ICD-10-CM | POA: Diagnosis not present

## 2023-01-02 LAB — POCT WET PREP (WET MOUNT): Clue Cells Wet Prep Whiff POC: POSITIVE

## 2023-01-02 LAB — POCT URINE PREGNANCY: Preg Test, Ur: POSITIVE — AB

## 2023-01-02 MED ORDER — FLUCONAZOLE 150 MG PO TABS: 150.0000 mg | ORAL_TABLET | Freq: Once | ORAL | 0 refills | Status: AC

## 2023-01-02 MED ORDER — METRONIDAZOLE 500 MG PO TABS: 500.0000 mg | ORAL_TABLET | Freq: Two times a day (BID) | ORAL | 0 refills | Status: AC

## 2023-01-02 NOTE — Patient Instructions (Addendum)
It was great to see you! Thank you for allowing me to participate in your care!   Our plans for today:  -We got your Pap smear today as well as gonorrhea and chlamydia testing and wet prep-you know what that shows -Your pregnancy test was positive today-if you do decide to keep this baby we recommend getting initial OB labs and scheduling a prenatal visit with [place].  I have also included information about termination if this is what you would like to do as well.  Please keep in touch with [place]!  Take care and seek immediate care sooner if you develop any concerns.  Levin Erp, MD   TERMINATION INFORMATION   Financial Assistance: Woman's Right To know - website with resources regarding finances [employer] [phone] [facility])- For Financial Assistance  [facility] [address] [place], [place], [place] [address] [phone] Information on abortion services: Medical abortions (Induced miscarriages, the miscarriage takes place the following day) Medication no further than 9 weeks Surgical up to 13  weeks Appointments last 1  to 3 hours  Fees: About $475 Some private insurance may cover it. Medicaid does NOT cover it. If you are self-pay, they will assess your finances if you need financial assistance.  Need state issued photo ID (no student ID) If under 25 y.o., then need parent or legal guardian's permission. Judicial bypass papers is needed if no legal guardian [employer]to [employer] of the [employer]) Call ahead 24 hours ahead.  [facility] [place] [address] [place], [place] [address] [phone] Can receive counseling about services in [place] office but no abortions done in this office, only [facility]   [facility] [address] [place], [place] [address] [phone] [email or web address] Fees: About $400 for medical abortion, more for surgical  Medical abortion up to about 9  weeks   Termination clinic : Medical Abortion / RU486   Medical Abortions, also known as RU486 or "the abortion pill", are safe and effective methods of non-surgical abortions for women less than [redacted] weeks pregnant. Medical abortions are completed through taking two sets of pills, which result in a miscarriage-like abortion.  How it works RU486 abortions are completed through combining two medications, Mifeprex and Misoprostol. The first pill, Mifeprex, is taken in the medical office, and blocks the action of progesterone, a hormone needed to continue the pregnancy. 12-48 hours after taking Mifeprex, a woman will take the second set of tablets, Misoprostol, at home. Misoprostol works to gently soften and open the cervix, so that the discontinued pregnancy can pass through.  Effectiveness Medical abortions are 95-98% effective in completely terminating a pregnancy. Rates of effectiveness can depend on the treatment regimen and length of pregnancy. Though most medical abortions are successful, incomplete abortions are safely completed with additional medication or a surgical D&C procedure.  Benefits to choosing medical abortion Most women who choose medical over surgical abortions do so to avoid having surgery. These women often express that a medical abortion seems less invasive, that they feel more in control, and that they prefer to have their abortion experience at home. Many women also state that the medical abortion process seems to be more "natural" because it produces a miscarriage-like experience.  What to expect during a medical abortion Every woman who opts for a medical abortion will participate in a detailed counseling session before going home so that she is clear about instructions and what to expect during her abortion. Though not every medical abortion experience is the same,  most women report that bleeding and cramping begin within 2-7 hours of taking Misoprostol (the 2nd set of  medication), and that the heaviest bleeding/cramping decreases within 24 hours. All women are counseled on how to manage bleeding, and receive pain medication to help with cramping/discomfort. 10-15% of women may experience other side effects, such as nausea, fever, diarrhea, vomiting, or chills. It is highly recommended that women choosing medical abortion have at least 1-2 days off (beginning with the Misoprostol/home portion of the abortion procedure). We also recommend having a partner, family member or friend with you during this process.  Cost for medical abortions The cost of a medical abortion will differ by medical facility and state. Contact your local office for more information on cost. For those with state insurance or medical insurance, if surgical abortions are covered, medical abortions are also likely covered.    [facility]  [address] [place], [place] [phone]  Information on abortion services Only do surgical abortions No further than the 15th week Does not provide financial assistance  Fees: 1  through 12 weeks $329 12 wks through 14 wks $450 15 weeks $550  Procedures Thursday & Fridays  Need photo ID (student IDs with birth certificate can be accepted) Needs parent or legal guardian if under 25 years old Can get judicial waiver through [address] of [address] in [place]

## 2023-01-02 NOTE — Progress Notes (Signed)
    SUBJECTIVE:   CHIEF COMPLAINT / HPI:   Vaginal DischargeLate Period Patient is a 26 y.o. female presenting with vaginal discharge for 3 days.  She is interested in screening for sexually transmitted infections today. Found out her boyfriend has another sexual parnter who called her and she found out that he has trichomonas. Declines blood testing for STIs. Due for pap smear as well-hx of ASCUS. LMP was 2/28-she has been spotting in between. Took two pregnancy tests at home that were negative  PERTINENT  PMH / PSH: None relevant  OBJECTIVE:   BP 104/73   Pulse 65   Ht 5\' 1"  (1.549 m)   Wt 130 lb 8 oz (59.2 kg)   LMP 11/02/2022   SpO2 100%   BMI 24.66 kg/m    General: NAD, pleasant, able to participate in exam Respiratory: Normal effort, no obvious respiratory distress Pelvic: VULVA: normal appearing vulva with no masses, tenderness or lesions, VAGINA: Normal appearing vagina with normal color, no lesions, with copious and white discharge present, CERVIX: No lesions, white discharge present  Chaperone Gillermina Phy CMA present for pelvic exam  ASSESSMENT/PLAN:   Vaginal Discharge 27 y.o. female with vaginal discharge for 3 days. Wet prep performed today shows clue cells, positive whiff, KOH, yeast, trichomonas, consistent with BV, trichomonas, yeast infection.  Patient is interested in STI screening-obtain GC on Pap smear today. Plan: -Wet prep as above.  Will treat with metronidazole BID for 7 days. -Discussed protection during intercourse and contraceptive methods -Follow-up as needed  Positive Pregnancy Test Patient is overwhelmed with this.  She is not interested in keeping baby currently.  Will defer initial OB labs for now but I let patient know that we can always do this but please let us know.  I also included termination resources for her as well.   Levin Erp, MD [facility]

## 2023-01-05 LAB — CYTOLOGY - PAP
Chlamydia: NEGATIVE
Comment: NEGATIVE
Comment: NEGATIVE
Comment: NEGATIVE
Comment: NEGATIVE
Comment: NORMAL
Diagnosis: NEGATIVE
HPV 16: NEGATIVE
HPV 18 / 45: NEGATIVE
High risk HPV: POSITIVE — AB
Neisseria Gonorrhea: NEGATIVE

## 2023-10-16 ENCOUNTER — Other Ambulatory Visit: Payer: Self-pay

## 2023-10-16 ENCOUNTER — Emergency Department
Admission: EM | Admit: 2023-10-16 | Discharge: 2023-10-16 | Disposition: A | Discharge status: Home / Self Care | Payer: Medicaid Other | Attending: Emergency Medicine | Admitting: Emergency Medicine

## 2023-10-16 DIAGNOSIS — J101 Influenza due to other identified influenza virus with other respiratory manifestations: Secondary | ICD-10-CM | POA: Diagnosis not present

## 2023-10-16 DIAGNOSIS — R059 Cough, unspecified: Secondary | ICD-10-CM | POA: Diagnosis present

## 2023-10-16 DIAGNOSIS — R112 Nausea with vomiting, unspecified: Secondary | ICD-10-CM | POA: Diagnosis not present

## 2023-10-16 DIAGNOSIS — Z20822 Contact with and (suspected) exposure to covid-19: Secondary | ICD-10-CM | POA: Insufficient documentation

## 2023-10-16 LAB — COMPREHENSIVE METABOLIC PANEL
ALT: 31 U/L (ref 0–44)
AST: 34 U/L (ref 15–41)
Albumin: 4.7 g/dL (ref 3.5–5.0)
Alkaline Phosphatase: 60 U/L (ref 38–126)
Anion gap: 17 — ABNORMAL HIGH (ref 5–15)
BUN: 20 mg/dL (ref 6–20)
CO2: 19 mmol/L — ABNORMAL LOW (ref 22–32)
Calcium: 9.8 mg/dL (ref 8.9–10.3)
Chloride: 101 mmol/L (ref 98–111)
Creatinine, Ser: 0.78 mg/dL (ref 0.44–1.00)
GFR, Estimated: >60 mL/min (ref >60)
Glucose, Bld: 89 mg/dL (ref 70–99)
Potassium: 5 mmol/L (ref 3.5–5.1)
Sodium: 137 mmol/L (ref 135–145)
Total Bilirubin: 1.1 mg/dL (ref 0.0–1.2)
Total Protein: 8.7 g/dL — ABNORMAL HIGH (ref 6.5–8.1)

## 2023-10-16 LAB — CBC WITH DIFFERENTIAL/PLATELET
Abs Immature Granulocytes: 0.01 10*3/uL (ref 0.00–0.07)
Basophils Absolute: 0 10*3/uL (ref 0.0–0.1)
Basophils Relative: 1 %
Eosinophils Absolute: 0 10*3/uL (ref 0.0–0.5)
Eosinophils Relative: 0 %
HCT: 46.3 % — ABNORMAL HIGH (ref 36.0–46.0)
Hemoglobin: 15.1 g/dL — ABNORMAL HIGH (ref 12.0–15.0)
Immature Granulocytes: 0 %
Lymphocytes Relative: 21 %
Lymphs Abs: 0.9 10*3/uL (ref 0.7–4.0)
MCH: 30.4 pg (ref 26.0–34.0)
MCHC: 32.6 g/dL (ref 30.0–36.0)
MCV: 93.3 fL (ref 80.0–100.0)
Monocytes Absolute: 0.9 10*3/uL (ref 0.1–1.0)
Monocytes Relative: 20 %
Neutro Abs: 2.5 10*3/uL (ref 1.7–7.7)
Neutrophils Relative %: 58 %
Platelets: 125 10*3/uL — ABNORMAL LOW (ref 150–400)
RBC: 4.96 MIL/uL (ref 3.87–5.11)
RDW: 13.2 % (ref 11.5–15.5)
WBC: 4.3 10*3/uL (ref 4.0–10.5)
nRBC: 0 % (ref 0.0–0.2)

## 2023-10-16 LAB — RESP PANEL BY RT-PCR (RSV, FLU A&B, COVID)  RVPGX2
Influenza A by PCR: POSITIVE — AB
Influenza B by PCR: NEGATIVE
Resp Syncytial Virus by PCR: NEGATIVE
SARS Coronavirus 2 by RT PCR: NEGATIVE

## 2023-10-16 MED ORDER — PROMETHAZINE HCL 12.5 MG PO TABS: 12.5000 mg | ORAL_TABLET | Freq: Four times a day (QID) | ORAL | 0 refills | Status: DC | PRN

## 2023-10-16 MED ORDER — SODIUM CHLORIDE 0.9 % IV BOLUS
1000.0000 mL | Freq: Once | INTRAVENOUS | Status: AC
  Administered 2023-10-16: 1000 mL via INTRAVENOUS

## 2023-10-16 MED ORDER — ONDANSETRON HCL 4 MG/2ML IJ SOLN
4.0000 mg | Freq: Once | INTRAMUSCULAR | Status: AC
  Administered 2023-10-16: 4 mg via INTRAVENOUS
  Filled 2023-10-16: qty 2

## 2023-10-16 MED ORDER — ONDANSETRON 4 MG PO TBDP: 4.0000 mg | ORAL_TABLET | Freq: Three times a day (TID) | ORAL | 0 refills | Status: DC | PRN

## 2023-10-16 MED ORDER — SODIUM CHLORIDE 0.9 % IV SOLN
12.5000 mg | Freq: Four times a day (QID) | INTRAVENOUS | Status: DC | PRN
  Filled 2023-10-16: qty 0.5

## 2023-10-16 MED ORDER — ONDANSETRON 4 MG PO TBDP
4.0000 mg | ORAL_TABLET | Freq: Once | ORAL | Status: DC
  Filled 2023-10-16: qty 1

## 2023-10-16 MED ORDER — LORAZEPAM 2 MG/ML IJ SOLN
1.0000 mg | Freq: Once | INTRAMUSCULAR | Status: AC
  Administered 2023-10-16: 1 mg via INTRAVENOUS
  Filled 2023-10-16: qty 1

## 2023-10-16 MED ORDER — KETOROLAC TROMETHAMINE 30 MG/ML IJ SOLN
15.0000 mg | Freq: Once | INTRAMUSCULAR | Status: AC
  Administered 2023-10-16: 15 mg via INTRAVENOUS
  Filled 2023-10-16: qty 1

## 2023-10-16 MED ORDER — SODIUM CHLORIDE 0.9 % IV SOLN
12.5000 mg | Freq: Once | INTRAVENOUS | Status: AC
  Administered 2023-10-16: 12.5 mg via INTRAVENOUS
  Filled 2023-10-16: qty 12.5

## 2023-10-16 NOTE — ED Provider Notes (Addendum)
 [facility] Provider Note    Event Date/Time   First MD Initiated Contact with Patient 10/16/23 1543     (approximate)  History   Chief Complaint: Emesis  HPI  Tara Gonzalez is a 28 y.o. female with a past medical history of anemia, bipolar, presents emergency department for fever cough congestion nausea and vomiting.  According to the patient for the past 4 days she has been congested with fever intermittent headaches nausea vomiting.  States she began seeing some blood streaks in her vomit 2 days ago.  Was concerned/came to the emergency department for evaluation.  Physical Exam   Triage Vital Signs: ED Triage Vitals  Encounter Vitals Group     BP 10/16/23 1449 (!) 152/108     Systolic BP Percentile --      Diastolic BP Percentile --      Pulse Rate 10/16/23 1449 (!) 118     Resp 10/16/23 1449 19     Temp 10/16/23 1447 98.4 F (36.9 C)     Temp Source 10/16/23 1447 Oral     SpO2 10/16/23 1449 100 %     Weight 10/16/23 1448 115 lb 11.9 oz (52.5 kg)     Height 10/16/23 1448 5\' 2"  (1.575 m)     Head Circumference --      Peak Flow --      Pain Score 10/16/23 1448 9     Pain Loc --      Pain Education --      Exclude from Growth Chart --     Most recent vital signs: Vitals:   10/16/23 1447 10/16/23 1449  BP:  (!) 152/108  Pulse:  (!) 118  Resp:  19  Temp: 98.4 F (36.9 C)   SpO2:  100%    General: Awake, no distress.  CV:  Good peripheral perfusion.  Regular rate and rhythm around 100 bpm Resp:  Normal effort.  Equal breath sounds bilaterally.  Abd:  No distention.  Soft, nontender.  No rebound or guarding.  ED Results / Procedures / Treatments   MEDICATIONS ORDERED IN ED: Medications  ketorolac  (TORADOL ) 30 MG/ML injection 15 mg (has no administration in time range)  ondansetron  (ZOFRAN ) injection 4 mg (has no administration in time range)  sodium chloride  0.9 % bolus 1,000 mL (has no administration in time range)     IMPRESSION  / MDM / ASSESSMENT AND PLAN / ED COURSE  I reviewed the triage vital signs and the nursing notes.  Patient's presentation is most consistent with acute presentation with potential threat to life or bodily function.  Patient presents to the emergency department for nausea vomiting headache congestion fever over the last 4 days.  Overall patient appears well does have drier appearing mucous membranes, pulse rate around 100 to 120 bpm.  Clear breath sounds no wheeze rales or rhonchi.  Lab work shows reassuring CBC with a normal white blood cell count.  Patient's chemistry is concerning for dehydration with a bicarb of 19 and anion gap of 17.  Patient's influenza A test is positive likely explaining the patient's symptoms.  We will treat with Toradol , Zofran , IV fluids.  Will discharge with Zofran  and supportive care.  Discussed Tylenol  or ibuprofen  with the patient as well as plenty fluids and rest.  Patient continues to have nausea vomiting despite 2 rounds of antiemetics.  Will dose an additional liter of fluid as well as 1 mg of Ativan  to see if this helps with  the patient's muscle aches as well as nausea.  Patient states her last menstrual period was approximately 2 weeks ago with no chance of pregnancy.  Patient states she is feeling much better after this latest round of medication.  States she is ready to go home.  We will discharge with Phenergan  for nausea.  Discussed supportive care as well as return precautions.  FINAL CLINICAL IMPRESSION(S) / ED DIAGNOSES   Influenza A Nausea vomiting  Rx / DC Orders   Zofran   Note:  This document was prepared using Dragon voice recognition software and may include unintentional dictation errors.   Ruth Cove, MD 10/16/23 1559    Ruth Cove, MD 10/16/23 2229

## 2023-10-16 NOTE — ED Triage Notes (Signed)
 Pt c/o vomiting x3 days, headache, fever, and bodyaches. Pt says her kids were sick. Pt says she is unable to keep any liquids down and threw up blood today, which prompted her to come in.

## 2023-10-17 ENCOUNTER — Encounter: Payer: Self-pay | Admitting: Emergency Medicine

## 2023-10-17 ENCOUNTER — Other Ambulatory Visit: Payer: Self-pay

## 2023-10-17 ENCOUNTER — Inpatient Hospital Stay
Admission: EM | Admit: 2023-10-17 | Discharge: 2023-10-20 | DRG: 391 | Disposition: A | Discharge status: Home / Self Care | Payer: Medicaid Other | Attending: Obstetrics and Gynecology | Admitting: Obstetrics and Gynecology

## 2023-10-17 DIAGNOSIS — K2950 Unspecified chronic gastritis without bleeding: Secondary | ICD-10-CM | POA: Diagnosis present

## 2023-10-17 DIAGNOSIS — Z8249 Family history of ischemic heart disease and other diseases of the circulatory system: Secondary | ICD-10-CM

## 2023-10-17 DIAGNOSIS — R1110 Vomiting, unspecified: Secondary | ICD-10-CM | POA: Diagnosis present

## 2023-10-17 DIAGNOSIS — K269 Duodenal ulcer, unspecified as acute or chronic, without hemorrhage or perforation: Secondary | ICD-10-CM | POA: Diagnosis present

## 2023-10-17 DIAGNOSIS — F419 Anxiety disorder, unspecified: Secondary | ICD-10-CM | POA: Diagnosis not present

## 2023-10-17 DIAGNOSIS — F32A Depression, unspecified: Secondary | ICD-10-CM | POA: Diagnosis not present

## 2023-10-17 DIAGNOSIS — K922 Gastrointestinal hemorrhage, unspecified: Secondary | ICD-10-CM

## 2023-10-17 DIAGNOSIS — D72819 Decreased white blood cell count, unspecified: Secondary | ICD-10-CM | POA: Diagnosis present

## 2023-10-17 DIAGNOSIS — K264 Chronic or unspecified duodenal ulcer with hemorrhage: Secondary | ICD-10-CM | POA: Diagnosis present

## 2023-10-17 DIAGNOSIS — K529 Noninfective gastroenteritis and colitis, unspecified: Secondary | ICD-10-CM | POA: Diagnosis not present

## 2023-10-17 DIAGNOSIS — Z9049 Acquired absence of other specified parts of digestive tract: Secondary | ICD-10-CM

## 2023-10-17 DIAGNOSIS — R059 Cough, unspecified: Secondary | ICD-10-CM | POA: Diagnosis not present

## 2023-10-17 DIAGNOSIS — R197 Diarrhea, unspecified: Secondary | ICD-10-CM | POA: Diagnosis not present

## 2023-10-17 DIAGNOSIS — R110 Nausea: Secondary | ICD-10-CM | POA: Diagnosis not present

## 2023-10-17 DIAGNOSIS — D500 Iron deficiency anemia secondary to blood loss (chronic): Secondary | ICD-10-CM | POA: Diagnosis present

## 2023-10-17 DIAGNOSIS — F319 Bipolar disorder, unspecified: Secondary | ICD-10-CM | POA: Diagnosis present

## 2023-10-17 DIAGNOSIS — J101 Influenza due to other identified influenza virus with other respiratory manifestations: Secondary | ICD-10-CM | POA: Diagnosis not present

## 2023-10-17 DIAGNOSIS — R531 Weakness: Secondary | ICD-10-CM | POA: Diagnosis not present

## 2023-10-17 DIAGNOSIS — Z833 Family history of diabetes mellitus: Secondary | ICD-10-CM

## 2023-10-17 DIAGNOSIS — E876 Hypokalemia: Secondary | ICD-10-CM | POA: Diagnosis present

## 2023-10-17 DIAGNOSIS — K920 Hematemesis: Secondary | ICD-10-CM | POA: Diagnosis not present

## 2023-10-17 DIAGNOSIS — R112 Nausea with vomiting, unspecified: Secondary | ICD-10-CM | POA: Diagnosis not present

## 2023-10-17 LAB — COMPREHENSIVE METABOLIC PANEL
ALT: 29 U/L (ref 0–44)
AST: 31 U/L (ref 15–41)
Albumin: 4.3 g/dL (ref 3.5–5.0)
Alkaline Phosphatase: 52 U/L (ref 38–126)
Anion gap: 15 (ref 5–15)
BUN: 15 mg/dL (ref 6–20)
CO2: 21 mmol/L — ABNORMAL LOW (ref 22–32)
Calcium: 9 mg/dL (ref 8.9–10.3)
Chloride: 103 mmol/L (ref 98–111)
Creatinine, Ser: 0.64 mg/dL (ref 0.44–1.00)
GFR, Estimated: >60 mL/min (ref >60)
Glucose, Bld: 105 mg/dL — ABNORMAL HIGH (ref 70–99)
Potassium: 4 mmol/L (ref 3.5–5.1)
Sodium: 139 mmol/L (ref 135–145)
Total Bilirubin: 0.7 mg/dL (ref 0.0–1.2)
Total Protein: 7.6 g/dL (ref 6.5–8.1)

## 2023-10-17 LAB — CBC WITH DIFFERENTIAL/PLATELET
Abs Immature Granulocytes: 0.01 10*3/uL (ref 0.00–0.07)
Basophils Absolute: 0 10*3/uL (ref 0.0–0.1)
Basophils Relative: 0 %
Eosinophils Absolute: 0.1 10*3/uL (ref 0.0–0.5)
Eosinophils Relative: 2 %
HCT: 40.8 % (ref 36.0–46.0)
Hemoglobin: 13.7 g/dL (ref 12.0–15.0)
Immature Granulocytes: 0 %
Lymphocytes Relative: 18 %
Lymphs Abs: 0.7 10*3/uL (ref 0.7–4.0)
MCH: 30.5 pg (ref 26.0–34.0)
MCHC: 33.6 g/dL (ref 30.0–36.0)
MCV: 90.9 fL (ref 80.0–100.0)
Monocytes Absolute: 0.6 10*3/uL (ref 0.1–1.0)
Monocytes Relative: 16 %
Neutro Abs: 2.5 10*3/uL (ref 1.7–7.7)
Neutrophils Relative %: 64 %
Platelets: 163 10*3/uL (ref 150–400)
RBC: 4.49 MIL/uL (ref 3.87–5.11)
RDW: 13 % (ref 11.5–15.5)
WBC: 3.9 10*3/uL — ABNORMAL LOW (ref 4.0–10.5)
nRBC: 0 % (ref 0.0–0.2)

## 2023-10-17 LAB — LIPASE, BLOOD: Lipase: 24 U/L (ref 11–51)

## 2023-10-17 MED ORDER — TRAZODONE HCL 50 MG PO TABS
25.0000 mg | ORAL_TABLET | Freq: Every evening | ORAL | Status: DC | PRN
  Administered 2023-10-19 (×2): 25 mg via ORAL
  Filled 2023-10-17 (×2): qty 1

## 2023-10-17 MED ORDER — SODIUM CHLORIDE 0.9 % IV BOLUS
1000.0000 mL | Freq: Once | INTRAVENOUS | Status: AC
  Administered 2023-10-17: 1000 mL via INTRAVENOUS

## 2023-10-17 MED ORDER — ONDANSETRON HCL 4 MG PO TABS
4.0000 mg | ORAL_TABLET | Freq: Four times a day (QID) | ORAL | Status: DC | PRN
Start: 2023-10-17 — End: 2023-10-20

## 2023-10-17 MED ORDER — ACETAMINOPHEN 650 MG RE SUPP: 650.0000 mg | Freq: Four times a day (QID) | RECTAL | Status: DC | PRN

## 2023-10-17 MED ORDER — MAGNESIUM HYDROXIDE 400 MG/5ML PO SUSP: 30.0000 mL | Freq: Every day | ORAL | Status: DC | PRN

## 2023-10-17 MED ORDER — SODIUM CHLORIDE 0.9 % IV SOLN
12.5000 mg | Freq: Once | INTRAVENOUS | Status: AC
Start: 2023-10-17 — End: 2023-10-17
  Administered 2023-10-17: 12.5 mg via INTRAVENOUS
  Filled 2023-10-17: qty 0.5

## 2023-10-17 MED ORDER — ACETAMINOPHEN 325 MG PO TABS: 650.0000 mg | ORAL_TABLET | Freq: Four times a day (QID) | ORAL | Status: DC | PRN

## 2023-10-17 MED ORDER — KETOROLAC TROMETHAMINE 15 MG/ML IJ SOLN
15.0000 mg | Freq: Once | INTRAMUSCULAR | Status: AC
  Administered 2023-10-17: 15 mg via INTRAVENOUS
  Filled 2023-10-17: qty 1

## 2023-10-17 MED ORDER — ENOXAPARIN SODIUM 40 MG/0.4ML IJ SOSY: 40.0000 mg | PREFILLED_SYRINGE | INTRAMUSCULAR | Status: DC

## 2023-10-17 MED ORDER — LORAZEPAM 2 MG/ML IJ SOLN
1.0000 mg | Freq: Once | INTRAMUSCULAR | Status: AC
  Administered 2023-10-17: 1 mg via INTRAVENOUS
  Filled 2023-10-17: qty 1

## 2023-10-17 MED ORDER — SODIUM CHLORIDE 0.9 % IV SOLN
12.5000 mg | Freq: Once | INTRAVENOUS | Status: AC
Start: 2023-10-17 — End: 2023-10-17
  Administered 2023-10-17: 12.5 mg via INTRAVENOUS
  Filled 2023-10-17: qty 12.5

## 2023-10-17 MED ORDER — SODIUM CHLORIDE 0.9 % IV SOLN
12.5000 mg | Freq: Four times a day (QID) | INTRAVENOUS | Status: DC | PRN
  Administered 2023-10-18 – 2023-10-19 (×2): 12.5 mg via INTRAVENOUS
  Filled 2023-10-17: qty 12.5
  Filled 2023-10-17 (×2): qty 0.5

## 2023-10-17 MED ORDER — SODIUM CHLORIDE 0.9 % IV SOLN: INTRAVENOUS | Status: DC

## 2023-10-17 MED ORDER — ONDANSETRON HCL 4 MG/2ML IJ SOLN
4.0000 mg | Freq: Four times a day (QID) | INTRAMUSCULAR | Status: DC | PRN
  Administered 2023-10-18 – 2023-10-20 (×4): 4 mg via INTRAVENOUS
  Filled 2023-10-17 (×5): qty 2

## 2023-10-17 NOTE — ED Triage Notes (Signed)
Patient to ED via POV for vomiting. Seen yesterday for same. Flu positive. Given prescription for phenergan and states very time she tries to take it she throws up. States has also taken zofran with no relief.

## 2023-10-17 NOTE — ED Provider Notes (Addendum)
[facility] Provider Note    Event Date/Time   First MD Initiated Contact with Patient 10/17/23 1948     (approximate)   History   Emesis   HPI  Tara Gonzalez is a 28 y.o. female with history of anemia and bipolar who presents with nausea and vomiting for the last several days associated with chills and bodyaches.  The patient was seen for this in the ED yesterday and diagnosed with influenza.  She received several medications including IV Phenergan and ultimately improved in the ED.  However, she states that since yesterday the oral Phenergan she was prescribed has not helped and she has had persistent vomiting and has not been able to hold anything down.  She denies any focal abdominal pain but just reports generalized discomfort.  I reviewed the past medical records and confirmed that the patient was seen in the ED yesterday for the same symptoms.  She improved with IV Phenergan and fluids.  She was positive for influenza A.  Previously her most recent outpatient counter here was with family medicine on 4/29 of last year for evaluation of vaginal discharge.   Physical Exam   Triage Vital Signs: ED Triage Vitals [10/17/23 1818]  Encounter Vitals Group     BP 137/84     Systolic BP Percentile      Diastolic BP Percentile      Pulse Rate 92     Resp 18     Temp 98.3 F (36.8 C)     Temp Source Oral     SpO2 99 %     Weight 115 lb (52.2 kg)     Height 5\' 2"  (1.575 m)     Head Circumference      Peak Flow      Pain Score 8     Pain Loc      Pain Education      Exclude from Growth Chart     Most recent vital signs: Vitals:   10/17/23 1945 10/17/23 2217  BP:  120/78  Pulse:  64  Resp:  20  Temp: 98.4 F (36.9 C)   SpO2:  98%     General: Awake, uncomfortable appearing, no distress.  CV:  Good peripheral perfusion.  Resp:  Normal effort.  Abd:  Soft with mild diffuse discomfort but no focal tenderness.  No distention.  Other:  Dry mucous  membranes.  No jaundice or scleral icterus.   ED Results / Procedures / Treatments   Labs (all labs ordered are listed, but only abnormal results are displayed) Labs Reviewed  COMPREHENSIVE METABOLIC PANEL - Abnormal; Notable for the following components:      Result Value   CO2 21 (*)    Glucose, Bld 105 (*)    All other components within normal limits  CBC WITH DIFFERENTIAL/PLATELET - Abnormal; Notable for the following components:   WBC 3.9 (*)    All other components within normal limits  LIPASE, BLOOD     EKG   RADIOLOGY   PROCEDURES:  Critical Care performed: No  Procedures   MEDICATIONS ORDERED IN ED: Medications  sodium chloride 0.9 % bolus 1,000 mL (1,000 mLs Intravenous New Bag/Given 10/17/23 2042)  promethazine (PHENERGAN) 12.5 mg in sodium chloride 0.9 % 50 mL IVPB (0 mg Intravenous Stopped 10/17/23 2108)  LORazepam (ATIVAN) injection 1 mg (1 mg Intravenous Given 10/17/23 2043)  ketorolac (TORADOL) 15 MG/ML injection 15 mg (15 mg Intravenous Given 10/17/23 2043)  sodium chloride  0.9 % bolus 1,000 mL (1,000 mLs Intravenous New Bag/Given 10/17/23 2210)  promethazine (PHENERGAN) 12.5 mg in sodium chloride 0.9 % 50 mL IVPB (0 mg Intravenous Stopped 10/17/23 2307)     IMPRESSION / MDM / ASSESSMENT AND PLAN / ED COURSE  I reviewed the triage vital signs and the nursing notes.  28 year old female with PMH as noted above presents with persistent nausea and vomiting not relieved by Zofran or Phenergan at home after being diagnosed with influenza A yesterday.  On exam her vital signs are normal.  Abdomen is soft with no focal tenderness.  Differential diagnosis includes, but is not limited to, persistent GI symptoms due to influenza A, gastritis, gastroenteritis, cannabinoid hyperemesis, cyclical vomiting.  CBC shows no acute abnormalities.  CMP and lipase are also within normal limits.  The patient states that the Phenergan and Ativan she received yesterday helped.  I  have ordered fluids, Toradol, Phenergan, and Ativan and we will reassess.  Patient's presentation is most consistent with acute complicated illness / injury requiring diagnostic workup.  The patient is on the cardiac monitor to evaluate for evidence of arrhythmia and/or significant heart rate changes.  ----------------------------------------- 11:24 PM on 10/17/2023 -----------------------------------------  The patient had some improvement with Phenergan and Ativan but is now vomiting again.  I have not ordered additional medication.  She will need admission for intractable vomiting.  ----------------------------------------- 11:30 PM on 10/17/2023 -----------------------------------------  I consulted Dr. Arville Care from the hospitalist service; based on our discussion he agrees to evaluate the patient for admission.   FINAL CLINICAL IMPRESSION(S) / ED DIAGNOSES   Final diagnoses:  Influenza A  Intractable vomiting     Rx / DC Orders   ED Discharge Orders     None        Note:  This document was prepared using Dragon voice recognition software and may include unintentional dictation errors.    Dionne Bucy, MD 10/17/23 [place] Sawyer    Dionne Bucy, MD 10/17/23 2330

## 2023-10-17 NOTE — ED Provider Triage Note (Signed)
Emergency Medicine Provider Triage Evaluation Note  Tara Gonzalez , a 28 y.o. female  was evaluated in triage.  Pt complains of vomiting. Patient was treated in the ED yesterday for the same. She tested positive for influenza yesterday. She was given IV fluids and multiple anti-emetics.   Review of Systems  Positive: Vomiting, fever Negative:   Physical Exam  BP 137/84 (BP Location: Left Arm)   Pulse 92   Temp 98.3 F (36.8 C) (Oral)   Resp 18   Ht 5\' 2"  (1.575 m)   Wt 52.2 kg   LMP 10/02/2023 (Approximate)   SpO2 99%   BMI 21.03 kg/m  Gen:   Awake, bent over in pain and vomiting Resp:  Normal effort  MSK:   Moves extremities without difficulty  Other:    Medical Decision Making  Medically screening exam initiated at 6:19 PM.  Appropriate orders placed.  Tara Gonzalez was informed that the remainder of the evaluation will be completed by another provider, this initial triage assessment does not replace that evaluation, and the importance of remaining in the ED until their evaluation is complete.  Offered patient ODT zofran while she waits and she declined stating that she would just throw it up.    Cameron Ali, PA-C 10/17/23 Rickey Primus

## 2023-10-17 NOTE — ED Notes (Signed)
Pt continues to dry heave, provider notified.

## 2023-10-17 NOTE — ED Notes (Signed)
Pt given ice chips per request. MD approved.

## 2023-10-17 NOTE — ED Notes (Signed)
Pt reports "Zofran and Phenergan is not working." Will Control and instrumentation engineer.

## 2023-10-18 ENCOUNTER — Inpatient Hospital Stay: Payer: Medicaid Other | Admitting: Anesthesiology

## 2023-10-18 ENCOUNTER — Inpatient Hospital Stay: Payer: Medicaid Other

## 2023-10-18 ENCOUNTER — Observation Stay: Payer: Medicaid Other

## 2023-10-18 ENCOUNTER — Encounter: Payer: Self-pay | Admitting: Obstetrics and Gynecology

## 2023-10-18 ENCOUNTER — Encounter
Admission: EM | Disposition: A | Discharge status: Home / Self Care | Payer: Self-pay | Source: Home / Self Care | Attending: Obstetrics and Gynecology

## 2023-10-18 DIAGNOSIS — D72819 Decreased white blood cell count, unspecified: Secondary | ICD-10-CM | POA: Diagnosis not present

## 2023-10-18 DIAGNOSIS — Z833 Family history of diabetes mellitus: Secondary | ICD-10-CM | POA: Diagnosis not present

## 2023-10-18 DIAGNOSIS — K529 Noninfective gastroenteritis and colitis, unspecified: Secondary | ICD-10-CM | POA: Diagnosis not present

## 2023-10-18 DIAGNOSIS — K2970 Gastritis, unspecified, without bleeding: Secondary | ICD-10-CM | POA: Diagnosis not present

## 2023-10-18 DIAGNOSIS — F319 Bipolar disorder, unspecified: Secondary | ICD-10-CM | POA: Diagnosis not present

## 2023-10-18 DIAGNOSIS — K920 Hematemesis: Secondary | ICD-10-CM

## 2023-10-18 DIAGNOSIS — Z8249 Family history of ischemic heart disease and other diseases of the circulatory system: Secondary | ICD-10-CM | POA: Diagnosis not present

## 2023-10-18 DIAGNOSIS — K2951 Unspecified chronic gastritis with bleeding: Secondary | ICD-10-CM | POA: Diagnosis not present

## 2023-10-18 DIAGNOSIS — F419 Anxiety disorder, unspecified: Secondary | ICD-10-CM | POA: Diagnosis not present

## 2023-10-18 DIAGNOSIS — D500 Iron deficiency anemia secondary to blood loss (chronic): Secondary | ICD-10-CM | POA: Diagnosis not present

## 2023-10-18 DIAGNOSIS — K922 Gastrointestinal hemorrhage, unspecified: Secondary | ICD-10-CM

## 2023-10-18 DIAGNOSIS — R112 Nausea with vomiting, unspecified: Secondary | ICD-10-CM | POA: Diagnosis not present

## 2023-10-18 DIAGNOSIS — R110 Nausea: Secondary | ICD-10-CM | POA: Diagnosis not present

## 2023-10-18 DIAGNOSIS — K269 Duodenal ulcer, unspecified as acute or chronic, without hemorrhage or perforation: Secondary | ICD-10-CM

## 2023-10-18 DIAGNOSIS — K2950 Unspecified chronic gastritis without bleeding: Secondary | ICD-10-CM

## 2023-10-18 DIAGNOSIS — R197 Diarrhea, unspecified: Secondary | ICD-10-CM

## 2023-10-18 DIAGNOSIS — J101 Influenza due to other identified influenza virus with other respiratory manifestations: Secondary | ICD-10-CM | POA: Diagnosis not present

## 2023-10-18 DIAGNOSIS — Z9049 Acquired absence of other specified parts of digestive tract: Secondary | ICD-10-CM | POA: Diagnosis not present

## 2023-10-18 DIAGNOSIS — E876 Hypokalemia: Secondary | ICD-10-CM | POA: Diagnosis not present

## 2023-10-18 DIAGNOSIS — K264 Chronic or unspecified duodenal ulcer with hemorrhage: Secondary | ICD-10-CM | POA: Diagnosis not present

## 2023-10-18 HISTORY — PX: BIOPSY: SHX5522

## 2023-10-18 HISTORY — PX: ESOPHAGOGASTRODUODENOSCOPY: SHX5428

## 2023-10-18 LAB — CBC
HCT: 30.2 % — ABNORMAL LOW (ref 36.0–46.0)
Hemoglobin: 10.2 g/dL — ABNORMAL LOW (ref 12.0–15.0)
MCH: 30.4 pg (ref 26.0–34.0)
MCHC: 33.8 g/dL (ref 30.0–36.0)
MCV: 89.9 fL (ref 80.0–100.0)
Platelets: 126 10*3/uL — ABNORMAL LOW (ref 150–400)
RBC: 3.36 MIL/uL — ABNORMAL LOW (ref 3.87–5.11)
RDW: 13.1 % (ref 11.5–15.5)
WBC: 2.4 10*3/uL — ABNORMAL LOW (ref 4.0–10.5)
nRBC: 0 % (ref 0.0–0.2)

## 2023-10-18 LAB — BASIC METABOLIC PANEL
Anion gap: 11 (ref 5–15)
BUN: 15 mg/dL (ref 6–20)
CO2: 19 mmol/L — ABNORMAL LOW (ref 22–32)
Calcium: 7.9 mg/dL — ABNORMAL LOW (ref 8.9–10.3)
Chloride: 109 mmol/L (ref 98–111)
Creatinine, Ser: 0.5 mg/dL (ref 0.44–1.00)
GFR, Estimated: >60 mL/min (ref >60)
Glucose, Bld: 74 mg/dL (ref 70–99)
Potassium: 3.3 mmol/L — ABNORMAL LOW (ref 3.5–5.1)
Sodium: 139 mmol/L (ref 135–145)

## 2023-10-18 LAB — HEMOGLOBIN AND HEMATOCRIT, BLOOD
HCT: 33.4 % — ABNORMAL LOW (ref 36.0–46.0)
HCT: 32.4 % — ABNORMAL LOW (ref 36.0–46.0)
Hemoglobin: 11 g/dL — ABNORMAL LOW (ref 12.0–15.0)
Hemoglobin: 10.9 g/dL — ABNORMAL LOW (ref 12.0–15.0)

## 2023-10-18 LAB — TYPE AND SCREEN
ABO/RH(D): O POS
Antibody Screen: NEGATIVE

## 2023-10-18 LAB — HCG, QUANTITATIVE, PREGNANCY: hCG, Beta Chain, Quant, S: <1 m[IU]/mL (ref <5)

## 2023-10-18 LAB — HIV ANTIBODY (ROUTINE TESTING W REFLEX): HIV Screen 4th Generation wRfx: NONREACTIVE

## 2023-10-18 SURGERY — EGD (ESOPHAGOGASTRODUODENOSCOPY)
Anesthesia: General

## 2023-10-18 MED ORDER — FENTANYL CITRATE (PF) 100 MCG/2ML IJ SOLN
INTRAMUSCULAR | Status: DC | PRN
  Administered 2023-10-18 (×2): 50 ug via INTRAVENOUS

## 2023-10-18 MED ORDER — ACETAMINOPHEN 10 MG/ML IV SOLN
1000.0000 mg | Freq: Once | INTRAVENOUS | Status: AC
  Administered 2023-10-18: 1000 mg via INTRAVENOUS
  Filled 2023-10-18: qty 100

## 2023-10-18 MED ORDER — CALCIUM CARBONATE ANTACID 500 MG PO CHEW
1.0000 | CHEWABLE_TABLET | Freq: Once | ORAL | Status: DC
  Filled 2023-10-18: qty 1

## 2023-10-18 MED ORDER — LORAZEPAM 2 MG/ML IJ SOLN
0.5000 mg | INTRAMUSCULAR | Status: DC | PRN
  Administered 2023-10-18 – 2023-10-19 (×4): 0.5 mg via INTRAVENOUS
  Filled 2023-10-18 (×4): qty 1

## 2023-10-18 MED ORDER — FENTANYL CITRATE (PF) 100 MCG/2ML IJ SOLN
INTRAMUSCULAR | Status: AC
  Filled 2023-10-18: qty 2

## 2023-10-18 MED ORDER — GLYCOPYRROLATE 0.2 MG/ML IJ SOLN
INTRAMUSCULAR | Status: AC
  Filled 2023-10-18: qty 1

## 2023-10-18 MED ORDER — DEXAMETHASONE SODIUM PHOSPHATE 10 MG/ML IJ SOLN
INTRAMUSCULAR | Status: DC | PRN
  Administered 2023-10-18: 10 mg via INTRAVENOUS

## 2023-10-18 MED ORDER — LIDOCAINE HCL (PF) 2 % IJ SOLN
INTRAMUSCULAR | Status: AC
  Filled 2023-10-18: qty 5

## 2023-10-18 MED ORDER — PANTOPRAZOLE SODIUM 40 MG IV SOLR
40.0000 mg | Freq: Two times a day (BID) | INTRAVENOUS | Status: DC
  Administered 2023-10-18 – 2023-10-19 (×4): 40 mg via INTRAVENOUS
  Filled 2023-10-18 (×4): qty 10

## 2023-10-18 MED ORDER — LIDOCAINE HCL (CARDIAC) PF 100 MG/5ML IV SOSY
PREFILLED_SYRINGE | INTRAVENOUS | Status: DC | PRN
  Administered 2023-10-18: 60 mg via INTRAVENOUS

## 2023-10-18 MED ORDER — PROPOFOL 10 MG/ML IV BOLUS
INTRAVENOUS | Status: DC | PRN
Start: 2023-10-18 — End: 2023-10-18
  Administered 2023-10-18: 150 mg via INTRAVENOUS

## 2023-10-18 MED ORDER — ONDANSETRON HCL 4 MG/2ML IJ SOLN
INTRAMUSCULAR | Status: DC | PRN
  Administered 2023-10-18: 4 mg via INTRAVENOUS

## 2023-10-18 MED ORDER — DEXAMETHASONE SODIUM PHOSPHATE 10 MG/ML IJ SOLN
INTRAMUSCULAR | Status: AC
  Filled 2023-10-18: qty 1

## 2023-10-18 MED ORDER — PROPOFOL 500 MG/50ML IV EMUL
INTRAVENOUS | Status: DC | PRN
  Administered 2023-10-18: 150 ug/kg/min via INTRAVENOUS

## 2023-10-18 MED ORDER — MORPHINE SULFATE (PF) 2 MG/ML IV SOLN
2.0000 mg | INTRAVENOUS | Status: DC | PRN
  Administered 2023-10-18 (×2): 2 mg via INTRAVENOUS
  Filled 2023-10-18 (×2): qty 1

## 2023-10-18 MED ORDER — SUCCINYLCHOLINE CHLORIDE 200 MG/10ML IV SOSY
PREFILLED_SYRINGE | INTRAVENOUS | Status: DC | PRN
  Administered 2023-10-18: 80 mg via INTRAVENOUS

## 2023-10-18 MED ORDER — SUCCINYLCHOLINE CHLORIDE 200 MG/10ML IV SOSY
PREFILLED_SYRINGE | INTRAVENOUS | Status: AC
  Filled 2023-10-18: qty 10

## 2023-10-18 MED ORDER — ONDANSETRON HCL 4 MG/2ML IJ SOLN
INTRAMUSCULAR | Status: AC
  Filled 2023-10-18: qty 2

## 2023-10-18 MED ORDER — GLYCOPYRROLATE 0.2 MG/ML IJ SOLN
INTRAMUSCULAR | Status: DC | PRN
  Administered 2023-10-18: .2 mg via INTRAVENOUS

## 2023-10-18 MED ORDER — POTASSIUM CHLORIDE IN NACL 20-0.9 MEQ/L-% IV SOLN
INTRAVENOUS | Status: AC
  Filled 2023-10-18 (×3): qty 1000

## 2023-10-18 NOTE — Op Note (Signed)
[facility] Patient Name: Tara Gonzalez Procedure Date: 10/18/2023 1:33 PM MRN: [id number] Account #: [id number] Date of Birth: 1996-04-07 Admit Type: Inpatient Age: 28 Room: [facility] ENDO ROOM 4 Gender: Female Note Status: Finalized Instrument Name: Upper Endoscope [id number] Procedure:             Upper GI endoscopy Indications:           Hematemesis Providers:             Midge Minium MD, MD Referring MD:          [facility] Medicines:             Propofol per Anesthesia Complications:         No immediate complications. Procedure:             Pre-Anesthesia Assessment:                        - Prior to the procedure, a History and Physical was                         performed, and patient medications and allergies were                         reviewed. The patient's tolerance of previous                         anesthesia was also reviewed. The risks and benefits                         of the procedure and the sedation options and risks                         were discussed with the patient. All questions were                         answered, and informed consent was obtained. Prior                         Anticoagulants: The patient has taken no anticoagulant                         or antiplatelet agents. ASA Grade Assessment: II - A                         patient with mild systemic disease. After reviewing                         the risks and benefits, the patient was deemed in                         satisfactory condition to undergo the procedure.                        After obtaining informed consent, the endoscope was                         passed under direct vision. Throughout the procedure,  the patient's blood pressure, pulse, and oxygen                         saturations were monitored continuously. The Endoscope                         was introduced through the mouth, and advanced to the                          second part of duodenum. The upper GI endoscopy was                         accomplished without difficulty. The patient tolerated                         the procedure well. Findings:      The examined esophagus was normal.      Diffuse moderate inflammation characterized by erythema was found in the       entire examined stomach. Biopsies were taken with a cold forceps for       histology.      One non-bleeding cratered duodenal ulcer with no stigmata of bleeding       was found in the duodenal bulb. Impression:            - Normal esophagus.                        - Gastritis. Biopsied.                        - Non-bleeding duodenal ulcer with no stigmata of                         bleeding. Recommendation:        - Return patient to hospital ward for ongoing care.                        - Clear liquid diet.                        - Continue present medications.                        - Await pathology results. Procedure Code(s):     --- Professional ---                        [phone], Esophagogastroduodenoscopy, flexible,                         transoral; with biopsy, single or multiple Diagnosis Code(s):     --- Professional ---                        K92.0, Hematemesis                        K26.9, Duodenal ulcer, unspecified as acute or                         chronic, without hemorrhage or perforation CPT copyright 2022 American Medical Association. All rights reserved.  The codes documented in this report are preliminary and upon coder review may  be revised to meet current compliance requirements. Midge Minium MD, MD 10/18/2023 2:21:52 PM This report has been signed electronically. Number of Addenda: 0 Note Initiated On: 10/18/2023 1:33 PM Estimated Blood Loss:  Estimated blood loss: none. Estimated blood loss: none.      [facility]

## 2023-10-18 NOTE — Anesthesia Preprocedure Evaluation (Signed)
Anesthesia Evaluation  Patient identified by MRN, date of birth, ID band Patient awake  General Assessment Comment: Presented with intractable nausea/vomiting, and hematemesis  Reviewed: Allergy & Precautions, NPO status , Patient's Chart, lab work & pertinent test results  History of Anesthesia Complications Negative for: history of anesthetic complications  Airway Mallampati: III  TM Distance: >3 FB Neck ROM: Full    Dental no notable dental hx. (+) Teeth Intact   Pulmonary neg sleep apnea, neg COPD, Recent URI , Residual Cough, Patient abstained from smoking.Not current smoker Diagnosed with influenza A 1 week ago, feels she has been getting worse.   Pulmonary exam normal breath sounds clear to auscultation       Cardiovascular Exercise Tolerance: Good METS(-) hypertension(-) CAD and (-) Past MI negative cardio ROS (-) dysrhythmias  Rhythm:Regular Rate:Normal - Systolic murmurs    Neuro/Psych  Headaches PSYCHIATRIC DISORDERS Anxiety Depression Bipolar Disorder      GI/Hepatic ,neg GERD  ,,(+)     (-) substance abuse    Endo/Other  neg diabetes    Renal/GU negative Renal ROS     Musculoskeletal   Abdominal   Peds  Hematology   Anesthesia Other Findings Past Medical History: No date: Anemia     Comment:  during last pregnancy diagnosed  No date: Anxiety No date: Bipolar disorder (HCC) No date: Bipolar disorder (HCC) No date: Depression     Comment:  on going started diagnosed as child. not currently on               medication  No date: Headache No date: Pyelonephritis affecting pregnancy in first trimester  Reproductive/Obstetrics                             Anesthesia Physical Anesthesia Plan  ASA: 2  Anesthesia Plan: General   Post-op Pain Management:    Induction: Intravenous and Rapid sequence  PONV Risk Score and Plan: 3 and Ondansetron, Dexamethasone and Treatment  may vary due to age or medical condition  Airway Management Planned: Oral ETT and Video Laryngoscope Planned  Additional Equipment: None  Intra-op Plan:   Post-operative Plan: Extubation in OR  Informed Consent: I have reviewed the patients History and Physical, chart, labs and discussed the procedure including the risks, benefits and alternatives for the proposed anesthesia with the patient or authorized representative who has indicated his/her understanding and acceptance.     Dental advisory given  Plan Discussed with: CRNA and Surgeon  Anesthesia Plan Comments: (GI evaluating for mallory weiss tear. Given this urgency, unable to wait weeks for complete resolution of airway sensitivity s/p influenza. Discussed risks of anesthesia with patient, including PONV, sore throat, lip/dental/eye damage, aspiration. Rare risks discussed as well, such as cardiorespiratory and neurological sequelae, and allergic reactions. Discussed the role of CRNA in patient's perioperative care. Patient understands.)       Anesthesia Quick Evaluation

## 2023-10-18 NOTE — Progress Notes (Signed)
Patient is tearful and requesting to leave the hospital. RN discussed with the patient the importance of her being in the hospital. Patient states she does not believe she needs to be in the hospital anymore and would like to leave.   RN notified Larkin Ina, NP of the above information.

## 2023-10-18 NOTE — Consult Note (Signed)
Midge Minium, MD [facility]  [address], [address] [place], [place] [address] Phone: [phone] Fax : [phone]  Consultation  Referring Provider:     Dr. Arville Care Primary Care Physician:  Jerre Simon, MD Primary Gastroenterologist: Gentry Fitz         Reason for Consultation:     Hematemesis  Date of Admission:  10/17/2023 Date of Consultation:  10/18/2023         HPI:   Tara Gonzalez is a 28 y.o. female who has a history of anxiety and depression and bipolar disorder who reports that she has been having intractable nausea vomiting with diarrhea.  The patient was having this over the last 5 days.  She states that she was sent home with some medication that did not work and she came back.  The patient was found to have influenza A.  The patient's hemoglobin yesterday was 13.7 that came down to 11.0 at midnight and was 10.2 this morning.  She denies any black stools or bloody stools.  She has a history of a laparoscopic cholecystectomy back in 2021.  Patient had a KUB that was negative today.  The patient's white cell count has been decreased consistent with a viral illness.  Past Medical History:  Diagnosis Date   Anemia    during last pregnancy diagnosed    Anxiety    Bipolar disorder (HCC)    Bipolar disorder (HCC)    Depression    on going started diagnosed as child. not currently on medication    Headache    Pyelonephritis affecting pregnancy in first trimester     Past Surgical History:  Procedure Laterality Date   CHOLECYSTECTOMY N/A 06/25/2020   Procedure: LAPAROSCOPIC CHOLECYSTECTOMY;  Surgeon: Kinsinger, De Blanch, MD;  Location: [facility];  Service: General;  Laterality: N/A;   TONSILLECTOMY      Prior to Admission medications   Medication Sig Start Date End Date Taking? Authorizing Provider  ondansetron (ZOFRAN-ODT) 4 MG disintegrating tablet Take 1 tablet (4 mg total) by mouth every 8 (eight) hours as needed for nausea or vomiting. 10/16/23  Yes Minna Antis,  MD  polyethylene glycol (MIRALAX / GLYCOLAX) 17 g packet Take 17 g by mouth daily as needed for mild constipation. Patient not taking: Reported on 10/18/2023 06/26/20   Franne Forts, PA-C  promethazine (PHENERGAN) 12.5 MG tablet Take 1 tablet (12.5 mg total) by mouth every 6 (six) hours as needed for nausea or vomiting. Patient not taking: Reported on 10/18/2023 10/16/23   Minna Antis, MD    Family History  Problem Relation Age of Onset   Hypertension Mother    Hyperlipidemia Mother    Diabetes Maternal Grandmother        type II   Congenital Murmur Maternal Grandmother        with VSD   Diabetes Maternal Grandfather        type II   Asthma Brother    Autism spectrum disorder Brother      Social History   Tobacco Use   Smoking status: Never   Smokeless tobacco: Never  Vaping Use   Vaping status: Never Used  Substance Use Topics   Alcohol use: Yes   Drug use: Yes    Types: Marijuana    Allergies as of 10/17/2023   (No Known Allergies)    Review of Systems:    All systems reviewed and negative except where noted in HPI.   Physical Exam:  Vital signs  in last 24 hours: Temp:  [98.3 F (36.8 C)-98.9 F (37.2 C)] 98.6 F (37 C) (02/12 1059) Pulse Rate:  [51-92] 60 (02/12 1211) Resp:  [16-20] 19 (02/12 1211) BP: (103-151)/(64-102) 149/102 (02/12 1211) SpO2:  [98 %-100 %] 100 % (02/12 1211) Weight:  [52.2 kg] 52.2 kg (02/11 1818)   General:   Pleasant, cooperative in NAD Head:  Normocephalic and atraumatic. Eyes:   No icterus.   Conjunctiva pink. PERRLA. Ears:  Normal auditory acuity. Neck:  Supple; no masses or thyroidomegaly Lungs: Respirations even and unlabored. Lungs clear to auscultation bilaterally.   No wheezes, crackles, or rhonchi.  Heart:  Regular rate and rhythm;  Without murmur, clicks, rubs or gallops Abdomen:  Soft, nondistended, nontender. Normal bowel sounds. No appreciable masses or hepatomegaly.  No rebound or guarding.  Rectal:  Not  performed. Msk:  Symmetrical without gross deformities.    Extremities:  Without edema, cyanosis or clubbing. Neurologic:  Alert and oriented x3;  grossly normal neurologically. Skin:  Intact without significant lesions or rashes. Cervical Nodes:  No significant cervical adenopathy. Psych:  Alert and cooperative. Normal affect.  LAB RESULTS: Recent Labs    10/16/23 1452 10/17/23 1825 10/18/23 0055 10/18/23 0523  WBC 4.3 3.9*  --  2.4*  HGB 15.1* 13.7 11.0* 10.2*  HCT 46.3* 40.8 33.4* 30.2*  PLT 125* 163  --  126*   BMET Recent Labs    10/16/23 1452 10/17/23 1825 10/18/23 0523  NA 137 139 139  K 5.0 4.0 3.3*  CL 101 103 109  CO2 19* 21* 19*  GLUCOSE 89 105* 74  BUN 20 15 15   CREATININE 0.78 0.64 0.50  CALCIUM 9.8 9.0 7.9*   LFT Recent Labs    10/17/23 1825  PROT 7.6  ALBUMIN 4.3  AST 31  ALT 29  ALKPHOS 52  BILITOT 0.7   PT/INR No results for input(s): "LABPROT", "INR" in the last 72 hours.  STUDIES: DG Abd 1 View Result Date: 10/18/2023 CLINICAL DATA:  [id number] Nausea AND vomiting [id number] EXAM: ABDOMEN - 1 VIEW COMPARISON:  07/31/2022 FINDINGS: The bowel gas pattern is normal. No radio-opaque calculi or other significant radiographic abnormality are seen. IMPRESSION: Negative. Electronically Signed   By: Duanne Guess D.O.   On: 10/18/2023 11:23      Impression / Plan:   Assessment: Principal Problem:   Acute gastroenteritis Active Problems:   GI bleeding   Anxiety and depression   Influenza A   Tara Gonzalez is a 28 y.o. y/o female with influenza A and nausea vomiting diarrhea with a report of hematemesis.  I have discussed the case with the patient's hospitalist and the patient.  Plan:  Due to the patient's report of hematemesis and nausea vomiting with diarrhea, the patient will be set up for an upper endoscopy for today.  The patient's symptoms are likely due to her influenza A and her bleeding may be due to a Mallory-Weiss tear from her  vomiting.  The patient has been told about the plan to do the EGD today and she agrees with it.  Thank you for involving me in the care of this patient.      LOS: 0 days   Midge Minium, MD, MD. Clementeen Graham 10/18/2023, 1:50 PM,  Pager [phone] 7am-5pm  Check AMION for 5pm -7am coverage and on weekends   Note: This dictation was prepared with Dragon dictation along with smaller phrase technology. Any transcriptional errors that result from this process are unintentional.

## 2023-10-18 NOTE — Progress Notes (Addendum)
PROGRESS NOTE    Tara Gonzalez  QMV:[id number] DOB: 10-18-1995 DOA: 10/17/2023 PCP: Jerre Simon, MD  Outpatient Specialists: none    Brief Narrative:   Tara Gonzalez is a 28 y.o. African-American female with medical history significant for bipolar disorder, anxiety and depression, who presented to the emergency room with a [place]etti of intractable nausea and vomiting with associated diarrhea with loose bowel movements over the last 5 days.  She has seen occasional bright red blood in her vomitus and occasional blood clots per her report.  She admits to upper abdominal pain and chills without reported fever.  No dysuria, oliguria or hematuria or flank pain.  She has been having occasional dyspnea and wheezing.  No cough or hemoptysis.  No other bleeding diathesis.    Assessment & Plan:   Principal Problem:   Acute gastroenteritis Active Problems:   GI bleeding   Influenza A   Anxiety and depression  # Influenza A No respiratory symptoms and symptoms began about a week ago, main symptom appears to be nausea and vomiting - supportive care  # Nausea and vomiting # Hematemesis # Blood loss anemia Several days nausea/vomiting now with hematemesis which the patient reports to be large in volume. Likely MW tear. Hgb has declined from 15.1 on 2/10 to 10.2 today though some of this is likely dilutional from fluids. No diarrhea. Abdomen is soft. LFTs wnl. - will check kub - continue serial hgb - f/u hcg - continue IV PPI - GI to see - anti-emetics  # Bipolar disorder Not on meds   DVT prophylaxis: SCDs (will d/c the lovenox ordered on admission) Code Status: full Family Communication: none at bedside  Level of care: Telemetry Medical Status is: inpt    Consultants:  GI  Procedures: None thus far  Antimicrobials:  none    Subjective: Reports ongoing nausea, says had hematemesis earlier this morning, most recently spit up mucous with flecks of  blood.  Objective: Vitals:   10/18/23 0055 10/18/23 0512 10/18/23 0514 10/18/23 0751  BP:  128/64 128/64 (!) 151/73  Pulse:  67 67 (!) 56  Resp:  18 18 18   Temp: 98.4 F (36.9 C) 98.9 F (37.2 C) 98.9 F (37.2 C) 98.8 F (37.1 C)  TempSrc: Oral Oral Oral Axillary  SpO2:    100%  Weight:      Height:        Intake/Output Summary (Last 24 hours) at 10/18/2023 0820 Last data filed at 10/18/2023 0243 Gross per 24 hour  Intake 2000 ml  Output --  Net 2000 ml   Filed Weights   10/17/23 1818  Weight: 52.2 kg    Examination:  General exam: Appears calm  Respiratory system: Clear to auscultation. Respiratory effort normal. Cardiovascular system: S1 & S2 heard, RRR. No JVD, murmurs, rubs, gallops or clicks. No pedal edema. Gastrointestinal system: Abdomen is nondistended, soft and nontender. N  Central nervous system: Alert and oriented. No focal neurological deficits. Extremities: Symmetric 5 x 5 power. Skin: No rashes, lesions or ulcers Psychiatry: Judgement and insight appear normal. Mood & affect appropriate.     Data Reviewed: I have personally reviewed following labs and imaging studies  CBC: Recent Labs  Lab 10/16/23 1452 10/17/23 1825 10/18/23 0055 10/18/23 0523  WBC 4.3 3.9*  --  2.4*  NEUTROABS 2.5 2.5  --   --   HGB 15.1* 13.7 11.0* 10.2*  HCT 46.3* 40.8 33.4* 30.2*  MCV 93.3 90.9  --  89.9  PLT 125*  163  --  126*   Basic Metabolic Panel: Recent Labs  Lab 10/16/23 1452 10/17/23 1825 10/18/23 0523  NA 137 139 139  K 5.0 4.0 3.3*  CL 101 103 109  CO2 19* 21* 19*  GLUCOSE 89 105* 74  BUN 20 15 15   CREATININE 0.78 0.64 0.50  CALCIUM 9.8 9.0 7.9*   GFR: Estimated Creatinine Clearance: 85 mL/min (by C-G formula based on SCr of 0.5 mg/dL). Liver Function Tests: Recent Labs  Lab 10/16/23 1452 10/17/23 1825  AST 34 31  ALT 31 29  ALKPHOS 60 52  BILITOT 1.1 0.7  PROT 8.7* 7.6  ALBUMIN 4.7 4.3   Recent Labs  Lab 10/17/23 1825  LIPASE 24    No results for input(s): "AMMONIA" in the last 168 hours. Coagulation Profile: No results for input(s): "INR", "PROTIME" in the last 168 hours. Cardiac Enzymes: No results for input(s): "CKTOTAL", "CKMB", "CKMBINDEX", "TROPONINI" in the last 168 hours. BNP (last 3 results) No results for input(s): "PROBNP" in the last 8760 hours. HbA1C: No results for input(s): "HGBA1C" in the last 72 hours. CBG: No results for input(s): "GLUCAP" in the last 168 hours. Lipid Profile: No results for input(s): "CHOL", "HDL", "LDLCALC", "TRIG", "CHOLHDL", "LDLDIRECT" in the last 72 hours. Thyroid Function Tests: No results for input(s): "TSH", "T4TOTAL", "FREET4", "T3FREE", "THYROIDAB" in the last 72 hours. Anemia Panel: No results for input(s): "VITAMINB12", "FOLATE", "FERRITIN", "TIBC", "IRON", "RETICCTPCT" in the last 72 hours. Urine analysis:    Component Value Date/Time   COLORURINE YELLOW (A) 07/31/2022 0806   APPEARANCEUR HAZY (A) 07/31/2022 0806   LABSPEC 1.024 07/31/2022 0806   PHURINE 8.0 07/31/2022 0806   GLUCOSEU NEGATIVE 07/31/2022 0806   HGBUR NEGATIVE 07/31/2022 0806   BILIRUBINUR NEGATIVE 07/31/2022 0806   BILIRUBINUR negative 05/31/2017 1553   BILIRUBINUR NEG 08/02/2016 1004   KETONESUR NEGATIVE 07/31/2022 0806   PROTEINUR 100 (A) 07/31/2022 0806   UROBILINOGEN 0.2 05/31/2017 1553   UROBILINOGEN 1.0 04/23/2014 1439   NITRITE NEGATIVE 07/31/2022 0806   LEUKOCYTESUR NEGATIVE 07/31/2022 0806   Sepsis Labs: @LABRCNTIP (procalcitonin:4,lacticidven:4)  ) Recent Results (from the past 240 hours)  Resp panel by RT-PCR (RSV, Flu A&B, Covid) Anterior Nasal Swab     Status: Abnormal   Collection Time: 10/16/23  2:52 PM   Specimen: Anterior Nasal Swab  Result Value Ref Range Status   SARS Coronavirus 2 by RT PCR NEGATIVE NEGATIVE Final    Comment: (NOTE) SARS-CoV-2 target nucleic acids are NOT DETECTED.  The SARS-CoV-2 RNA is generally detectable in upper respiratory specimens  during the acute phase of infection. The lowest concentration of SARS-CoV-2 viral copies this assay can detect is 138 copies/mL. A negative result does not preclude SARS-Cov-2 infection and should not be used as the sole basis for treatment or other patient management decisions. A negative result may occur with  improper specimen collection/handling, submission of specimen other than nasopharyngeal swab, presence of viral mutation(s) within the areas targeted by this assay, and inadequate number of viral copies(<138 copies/mL). A negative result must be combined with clinical observations, patient history, and epidemiological information. The expected result is Negative.  Fact Sheet for Patients:  BloggerCourse.com  Fact Sheet for Healthcare Providers:  SeriousBroker.it  This test is no t yet approved or cleared by the [place] FDA and  has been authorized for detection and/or diagnosis of SARS-CoV-2 by FDA under an Emergency Use Authorization (EUA). This EUA will remain  in effect (meaning this test can be used) for  the duration of the COVID-19 declaration under Section 564(b)(1) of the Act, 21 U.S.C.section 360bbb-3(b)(1), unless the authorization is terminated  or revoked sooner.       Influenza A by PCR POSITIVE (A) NEGATIVE Final   Influenza B by PCR NEGATIVE NEGATIVE Final    Comment: (NOTE) The Xpert Xpress SARS-CoV-2/FLU/RSV plus assay is intended as an aid in the diagnosis of influenza from Nasopharyngeal swab specimens and should not be used as a sole basis for treatment. Nasal washings and aspirates are unacceptable for Xpert Xpress SARS-CoV-2/FLU/RSV testing.  Fact Sheet for Patients: BloggerCourse.com  Fact Sheet for Healthcare Providers: SeriousBroker.it  This test is not yet approved or cleared by the [place] FDA and has been authorized for detection  and/or diagnosis of SARS-CoV-2 by FDA under an Emergency Use Authorization (EUA). This EUA will remain in effect (meaning this test can be used) for the duration of the COVID-19 declaration under Section 564(b)(1) of the Act, 21 U.S.C. section 360bbb-3(b)(1), unless the authorization is terminated or revoked.     Resp Syncytial Virus by PCR NEGATIVE NEGATIVE Final    Comment: (NOTE) Fact Sheet for Patients: BloggerCourse.com  Fact Sheet for Healthcare Providers: SeriousBroker.it  This test is not yet approved or cleared by the [place] FDA and has been authorized for detection and/or diagnosis of SARS-CoV-2 by FDA under an Emergency Use Authorization (EUA). This EUA will remain in effect (meaning this test can be used) for the duration of the COVID-19 declaration under Section 564(b)(1) of the Act, 21 U.S.C. section 360bbb-3(b)(1), unless the authorization is terminated or revoked.  Performed at [facility], [address], [place], [place] [address]          Radiology Studies: No results found.      Scheduled Meds:  enoxaparin (LOVENOX) injection  40 mg Subcutaneous Q24H   pantoprazole (PROTONIX) IV  40 mg Intravenous Q12H   Continuous Infusions:  sodium chloride 100 mL/hr at 10/18/23 0023   promethazine (PHENERGAN) injection (IM or IVPB) 12.5 mg (10/18/23 0757)     LOS: 0 days     Silvano Bilis, MD [facility]   If 7PM-7AM, please contact night-coverage [email or web address] Password [email or web address] 10/18/2023, 8:20 AM

## 2023-10-18 NOTE — Transfer of Care (Signed)
Immediate Anesthesia Transfer of Care Note  Patient: Tara Gonzalez  Procedure(s) Performed: ESOPHAGOGASTRODUODENOSCOPY (EGD) BIOPSY  Patient Location: Endoscopy Unit  Anesthesia Type:General  Level of Consciousness: awake and oriented  Airway & Oxygen Therapy: Patient Spontanous Breathing and Patient connected to face mask oxygen  Post-op Assessment: Report given to RN and Post -op Vital signs reviewed and stable  Post vital signs: Reviewed  Last Vitals:  Vitals Value Taken Time  BP 122/92 10/18/23 1423  Temp 36.4 C 10/18/23 1415  Pulse 75 10/18/23 1423  Resp 15 10/18/23 1423  SpO2 100 % 10/18/23 1423    Last Pain:  Vitals:   10/18/23 1415  TempSrc: Temporal  PainSc:          Complications: No notable events documented.

## 2023-10-18 NOTE — Anesthesia Postprocedure Evaluation (Signed)
Anesthesia Post Note  Patient: Tara Gonzalez  Procedure(s) Performed: ESOPHAGOGASTRODUODENOSCOPY (EGD) BIOPSY  Patient location during evaluation: Endoscopy Anesthesia Type: General Level of consciousness: awake and alert Pain management: pain level controlled Vital Signs Assessment: post-procedure vital signs reviewed and stable Respiratory status: spontaneous breathing, nonlabored ventilation, respiratory function stable and patient connected to nasal cannula oxygen Cardiovascular status: blood pressure returned to baseline and stable Postop Assessment: no apparent nausea or vomiting Anesthetic complications: no   No notable events documented.   Last Vitals:  Vitals:   10/18/23 1415 10/18/23 1423  BP: (!) 122/92 (!) 122/92  Pulse: 75 75  Resp: 17 15  Temp: 36.4 C   SpO2: 100% 100%    Last Pain:  Vitals:   10/18/23 1415  TempSrc: Temporal  PainSc: 0-No pain                 Corinda Gubler

## 2023-10-18 NOTE — Plan of Care (Signed)
Pt admitted today for vomiting blood, has EGD today with biopsy for H Pylori. No noted hematemesis.  Problem: Education: Goal: Knowledge of General Education information will improve Description: Including pain rating scale, medication(s)/side effects and non-pharmacologic comfort measures Outcome: Not Progressing   Problem: Health Behavior/Discharge Planning: Goal: Ability to manage health-related needs will improve Outcome: Not Progressing   Problem: Clinical Measurements: Goal: Ability to maintain clinical measurements within normal limits will improve Outcome: Not Progressing Goal: Will remain free from infection Outcome: Not Progressing Goal: Diagnostic test results will improve Outcome: Not Progressing Goal: Respiratory complications will improve Outcome: Not Progressing Goal: Cardiovascular complication will be avoided Outcome: Not Progressing   Problem: Activity: Goal: Risk for activity intolerance will decrease Outcome: Not Progressing   Problem: Nutrition: Goal: Adequate nutrition will be maintained Outcome: Not Progressing   Problem: Coping: Goal: Level of anxiety will decrease Outcome: Not Progressing   Problem: Elimination: Goal: Will not experience complications related to bowel motility Outcome: Not Progressing Goal: Will not experience complications related to urinary retention Outcome: Not Progressing   Problem: Pain Managment: Goal: General experience of comfort will improve and/or be controlled Outcome: Not Progressing   Problem: Safety: Goal: Ability to remain free from injury will improve Outcome: Not Progressing   Problem: Skin Integrity: Goal: Risk for impaired skin integrity will decrease Outcome: Not Progressing

## 2023-10-18 NOTE — Assessment & Plan Note (Signed)
-   We will place on as needed Ativan and trazodone.

## 2023-10-18 NOTE — Progress Notes (Signed)
MEWS Progress Note  Patient Details Name: Tara Gonzalez MRN: [id number] DOB: [birth year] Today's Date: 10/18/2023   MEWS Flowsheet Documentation:  Assess: MEWS Score Temp: 98.4 F (36.9 C) BP: (!) 130/90 MAP (mmHg): 102 Pulse Rate: 99 ECG Heart Rate: 90 Resp: (!) 40 Level of Consciousness: Alert SpO2: 100 % O2 Device: Room Air Patient Activity (if Appropriate): In bed O2 Flow Rate (L/min): 4 L/min Assess: MEWS Score MEWS Temp: 0 MEWS Systolic: 0 MEWS Pulse: 0 MEWS RR: 3 MEWS LOC: 0 MEWS Score: 3 MEWS Score Color: Yellow Assess: SIRS CRITERIA SIRS Temperature : 0 SIRS Respirations : 1 SIRS Pulse: 1 SIRS WBC: 0 SIRS Score Sum : 2 SIRS Temperature : 0 SIRS Pulse: 1 SIRS Respirations : 1 SIRS WBC: 0 SIRS Score Sum : 2 Assess: if the MEWS score is Yellow or Red Were vital signs accurate and taken at a resting state?: Yes Does the patient meet 2 or more of the SIRS criteria?: Yes Does the patient have a confirmed or suspected source of infection?: Yes MEWS guidelines implemented : Yes, yellow Treat MEWS Interventions: Considered administering scheduled or prn medications/treatments as ordered Take Vital Signs Increase Vital Sign Frequency : Yellow: Q2hr x1, continue Q4hrs until patient remains green for 12hrs Escalate MEWS: Escalate: Yellow: Discuss with charge nurse and consider notifying provider and/or RRT Notify: Charge Nurse/RN Name of Charge Nurse/RN Notified: Tara Dandy, RN Provider Notification Provider Name/Title: Larkin Ina, NP Date Provider Notified: 10/18/23 Time Provider Notified: 1943 Method of Notification: Page (secure chat) Notification Reason: Change in status Provider response: See new orders Date of Provider Response: 10/18/23 Time of Provider Response: 1943      Ernest Mallick 10/18/2023, 7:44 PM

## 2023-10-18 NOTE — Assessment & Plan Note (Signed)
-   The patient was admitted to an observation medical telemetry bed. - We will continue hydration with IV normal saline. - As needed antiemetics and antidiarrheals will be provided. - We will follow BMP. - She will be kept n.p.o. except for medications.Marland Kitchen

## 2023-10-18 NOTE — ED Notes (Signed)
Pt report given to [employer]. Pt transferred to 33.

## 2023-10-18 NOTE — Assessment & Plan Note (Signed)
-   The patient is afebrile and her symptoms started 5 days ago. - Supportive management will be provided.

## 2023-10-18 NOTE — H&P (Addendum)
[place]   PATIENT NAME: Tara Gonzalez    MR#:  [id number]  DATE OF BIRTH:  05-10-1996  DATE OF ADMISSION:  10/17/2023  PRIMARY CARE PHYSICIAN: Jerre Simon, MD   Patient is coming from: Home  REQUESTING/REFERRING PHYSICIAN: Miki Kins, MD  CHIEF COMPLAINT:   Chief Complaint  Patient presents with   Emesis    HISTORY OF PRESENT ILLNESS:  Lamesha Tibbits is a 28 y.o. African-American female with medical history significant for bipolar disorder, anxiety and depression, who presented to the emergency room with a Kalisetti of intractable nausea and vomiting with associated diarrhea with loose bowel movements over the last 5 days.  She has seen occasional bright red blood in her vomitus and occasional blood clots per her report.  She admits to upper abdominal pain and chills without reported fever.  No dysuria, oliguria or hematuria or flank pain.  She has been having occasional dyspnea and wheezing.  No cough or hemoptysis.  No other bleeding diathesis.  ED Course: When the patient came to the emergency room, vital signs were within normal.  Labs revealed a CO2 of 21 and glucose of 105 with otherwise unremarkable CMP.  CBC showed mild leukopenia of 3.9.  Influenza A came back positive on 2/10 and with negative respiratory panel.  Repeat H&H were 11 and 33.4. EKG as reviewed by me : None. Imaging: None.  The patient was given 15 mg IV Toradol, 1 mg of IV Ativan, 12.5 mg of IV Phenergan twice and 2 L bolus of IV normal saline.  She will be admitted to a medical telemetry observation bed for further evaluation and management.  PAST MEDICAL HISTORY:   Past Medical History:  Diagnosis Date   Anemia    during last pregnancy diagnosed    Anxiety    Bipolar disorder (HCC)    Bipolar disorder (HCC)    Depression    on going started diagnosed as child. not currently on medication    Headache    Pyelonephritis affecting pregnancy in first trimester     PAST SURGICAL  HISTORY:   Past Surgical History:  Procedure Laterality Date   CHOLECYSTECTOMY N/A 06/25/2020   Procedure: LAPAROSCOPIC CHOLECYSTECTOMY;  Surgeon: Kinsinger, De Blanch, MD;  Location: [facility];  Service: General;  Laterality: N/A;   TONSILLECTOMY      SOCIAL HISTORY:   Social History   Tobacco Use   Smoking status: Never   Smokeless tobacco: Never  Substance Use Topics   Alcohol use: Yes    FAMILY HISTORY:   Family History  Problem Relation Age of Onset   Hypertension Mother    Hyperlipidemia Mother    Diabetes Maternal Grandmother        type II   Congenital Murmur Maternal Grandmother        with VSD   Diabetes Maternal Grandfather        type II   Asthma Brother    Autism spectrum disorder Brother     DRUG ALLERGIES:  No Known Allergies  REVIEW OF SYSTEMS:   ROS As per history of present illness. All pertinent systems were reviewed above. Constitutional, HEENT, cardiovascular, respiratory, GI, GU, musculoskeletal, neuro, psychiatric, endocrine, integumentary and hematologic systems were reviewed and are otherwise negative/unremarkable except for positive findings mentioned above in the HPI.   MEDICATIONS AT HOME:   Prior to Admission medications   Medication Sig Start Date End Date Taking? Authorizing Provider  ondansetron (ZOFRAN-ODT) 4 MG disintegrating tablet Take  1 tablet (4 mg total) by mouth every 8 (eight) hours as needed for nausea or vomiting. 10/16/23   Minna Antis, MD  polyethylene glycol (MIRALAX / GLYCOLAX) 17 g packet Take 17 g by mouth daily as needed for mild constipation. 06/26/20   Meuth, Lina Sar, PA-C  promethazine (PHENERGAN) 12.5 MG tablet Take 1 tablet (12.5 mg total) by mouth every 6 (six) hours as needed for nausea or vomiting. 10/16/23   Minna Antis, MD      VITAL SIGNS:  Blood pressure 120/78, pulse 64, temperature 98.4 F (36.9 C), temperature source Oral, resp. rate 20, height 5\' 2"  (1.575 m), weight 52.2 kg, last  menstrual period 10/02/2023, SpO2 98%.  PHYSICAL EXAMINATION:  Physical Exam  GENERAL:  28 y.o.-year-old patient lying in the bed with no acute distress.  EYES: Pupils equal, round, reactive to light and accommodation. No scleral icterus. Extraocular muscles intact.  HEENT: Head atraumatic, normocephalic. Oropharynx and nasopharynx clear.  NECK:  Supple, no jugular venous distention. No thyroid enlargement, no tenderness.  LUNGS: Normal breath sounds bilaterally, no wheezing, rales,rhonchi or crepitation. No use of accessory muscles of respiration.  CARDIOVASCULAR: Regular rate and rhythm, S1, S2 normal. No murmurs, rubs, or gallops.  ABDOMEN: Soft, nondistended, with epigastric abdominal tenderness without rebound tenderness guarding or rigidity.  Bowel sounds present. No organomegaly or mass.  EXTREMITIES: No pedal edema, cyanosis, or clubbing.  NEUROLOGIC: Cranial nerves II through XII are intact. Muscle strength 5/5 in all extremities. Sensation intact. Gait not checked.  PSYCHIATRIC: The patient is alert and oriented x 3.  Normal affect and good eye contact. SKIN: No obvious rash, lesion, or ulcer.   LABORATORY PANEL:   CBC Recent Labs  Lab 10/17/23 1825 10/18/23 0055  WBC 3.9*  --   HGB 13.7 11.0*  HCT 40.8 33.4*  PLT 163  --    ------------------------------------------------------------------------------------------------------------------  Chemistries  Recent Labs  Lab 10/17/23 1825  NA 139  K 4.0  CL 103  CO2 21*  GLUCOSE 105*  BUN 15  CREATININE 0.64  CALCIUM 9.0  AST 31  ALT 29  ALKPHOS 52  BILITOT 0.7   ------------------------------------------------------------------------------------------------------------------  Cardiac Enzymes No results for input(s): "TROPONINI" in the last 168 hours. ------------------------------------------------------------------------------------------------------------------  RADIOLOGY:  No results  found.    IMPRESSION AND PLAN:  Assessment and Plan: * Acute gastroenteritis - The patient was admitted to an observation medical telemetry bed. - We will continue hydration with IV normal saline. - As needed antiemetics and antidiarrheals will be provided. - We will follow BMP. - She will be kept n.p.o. except for medications..  GI bleeding - This could be related to Mallory-Weiss tear from excessive nausea and vomiting.  Differential diagnosis would include gastritis, adenitis, gastric or duodenal ulcers or erosions or erosive esophagitis. - We will follow serial hemoglobins and hematocrits. - We will place her on IV PPI therapy with Protonix. - GI consult to be obtained. - I notified Dr. Okey Dupre about the patient.  Influenza A - The patient is afebrile and her symptoms started 5 days ago. - Supportive management will be provided.  Anxiety and depression - We will place on as needed Ativan and trazodone.   DVT prophylaxis: Lovenox.  Advanced Care Planning:  Code Status: full code.  Family Communication:  The plan of care was discussed in details with the patient (and family). I answered all questions. The patient agreed to proceed with the above mentioned plan. Further management will depend upon hospital course. Disposition Plan:  Back to previous home environment Consults called: none.  All the records are reviewed and case discussed with ED provider.  Status is: Observation  I certify that at the time of admission, it is my clinical judgment that the patient will require hospital care extending less than 2 midnights.                            Dispo: The patient is from: Home              Anticipated d/c is to: Home              Patient currently is not medically stable to d/c.              Difficult to place patient: No  Hannah Beat M.D on 10/18/2023 at 1:24 AM  [facility]   From 7 PM-7 AM, contact night-coverage [email or web address]  CC: Primary care physician;  Jerre Simon, MD

## 2023-10-18 NOTE — Assessment & Plan Note (Addendum)
-   This could be related to Mallory-Weiss tear from excessive nausea and vomiting.  Differential diagnosis would include gastritis, adenitis, gastric or duodenal ulcers or erosions or erosive esophagitis. - We will follow serial hemoglobins and hematocrits. - We will place her on IV PPI therapy with Protonix. - GI consult to be obtained. - I notified Dr. Okey Dupre about the patient.

## 2023-10-18 NOTE — Anesthesia Procedure Notes (Addendum)
Procedure Name: Intubation Date/Time: 10/18/2023 2:10 PM  Performed by: Mathews Argyle, CRNAPre-anesthesia Checklist: Patient identified, Patient being monitored, Timeout performed, Emergency Drugs available and Suction available Patient Re-evaluated:Patient Re-evaluated prior to induction Oxygen Delivery Method: Circle system utilized Preoxygenation: Pre-oxygenation with 100% oxygen Induction Type: IV induction and Rapid sequence Laryngoscope Size: 3 and McGrath Grade View: Grade I Tube type: Oral Tube size: 7.0 mm Number of attempts: 1 Airway Equipment and Method: Stylet Placement Confirmation: ETT inserted through vocal cords under direct vision, positive ETCO2 and breath sounds checked- equal and bilateral Secured at: 21 cm Tube secured with: Tape Dental Injury: Teeth and Oropharynx as per pre-operative assessment

## 2023-10-18 NOTE — Plan of Care (Signed)

## 2023-10-19 ENCOUNTER — Encounter: Payer: Self-pay | Admitting: Gastroenterology

## 2023-10-19 DIAGNOSIS — K529 Noninfective gastroenteritis and colitis, unspecified: Secondary | ICD-10-CM | POA: Diagnosis not present

## 2023-10-19 LAB — BASIC METABOLIC PANEL
Anion gap: 10 (ref 5–15)
BUN: 10 mg/dL (ref 6–20)
CO2: 19 mmol/L — ABNORMAL LOW (ref 22–32)
Calcium: 8.6 mg/dL — ABNORMAL LOW (ref 8.9–10.3)
Chloride: 107 mmol/L (ref 98–111)
Creatinine, Ser: 0.51 mg/dL (ref 0.44–1.00)
GFR, Estimated: >60 mL/min (ref >60)
Glucose, Bld: 69 mg/dL — ABNORMAL LOW (ref 70–99)
Potassium: 3.6 mmol/L (ref 3.5–5.1)
Sodium: 136 mmol/L (ref 135–145)

## 2023-10-19 LAB — CBC
HCT: 32.5 % — ABNORMAL LOW (ref 36.0–46.0)
Hemoglobin: 11.2 g/dL — ABNORMAL LOW (ref 12.0–15.0)
MCH: 30.9 pg (ref 26.0–34.0)
MCHC: 34.5 g/dL (ref 30.0–36.0)
MCV: 89.5 fL (ref 80.0–100.0)
Platelets: 131 10*3/uL — ABNORMAL LOW (ref 150–400)
RBC: 3.63 MIL/uL — ABNORMAL LOW (ref 3.87–5.11)
RDW: 13 % (ref 11.5–15.5)
WBC: 5.8 10*3/uL (ref 4.0–10.5)
nRBC: 0 % (ref 0.0–0.2)

## 2023-10-19 LAB — TROPONIN I (HIGH SENSITIVITY): Troponin I (High Sensitivity): 3 ng/L (ref <18)

## 2023-10-19 LAB — SURGICAL PATHOLOGY

## 2023-10-19 MED ORDER — METOCLOPRAMIDE HCL 5 MG/ML IJ SOLN
10.0000 mg | Freq: Three times a day (TID) | INTRAMUSCULAR | Status: DC | PRN
  Administered 2023-10-19 – 2023-10-20 (×3): 10 mg via INTRAVENOUS
  Filled 2023-10-19 (×3): qty 2

## 2023-10-19 MED ORDER — SODIUM CHLORIDE 0.9 % IV SOLN
12.5000 mg | Freq: Once | INTRAVENOUS | Status: AC
  Administered 2023-10-19: 12.5 mg via INTRAVENOUS
  Filled 2023-10-19: qty 12.5

## 2023-10-19 MED ORDER — ACETAMINOPHEN 500 MG PO TABS
1000.0000 mg | ORAL_TABLET | Freq: Four times a day (QID) | ORAL | Status: DC | PRN
  Filled 2023-10-19 (×2): qty 2

## 2023-10-19 MED ORDER — METOCLOPRAMIDE HCL 10 MG PO TABS
10.0000 mg | ORAL_TABLET | Freq: Four times a day (QID) | ORAL | Status: DC | PRN
  Administered 2023-10-19: 10 mg via ORAL
  Filled 2023-10-19 (×2): qty 1

## 2023-10-19 MED ORDER — ALUM & MAG HYDROXIDE-SIMETH 200-200-20 MG/5ML PO SUSP
30.0000 mL | ORAL | Status: DC | PRN
  Administered 2023-10-19: 30 mL via ORAL
  Filled 2023-10-19: qty 30

## 2023-10-19 MED ORDER — ACETAMINOPHEN 10 MG/ML IV SOLN
1000.0000 mg | Freq: Once | INTRAVENOUS | Status: AC
  Administered 2023-10-19: 1000 mg via INTRAVENOUS
  Filled 2023-10-19: qty 100

## 2023-10-19 MED ORDER — ACETAMINOPHEN 650 MG RE SUPP: 650.0000 mg | Freq: Four times a day (QID) | RECTAL | Status: DC | PRN

## 2023-10-19 MED ORDER — DIPHENHYDRAMINE HCL 50 MG/ML IJ SOLN
25.0000 mg | Freq: Once | INTRAMUSCULAR | Status: AC
  Administered 2023-10-19: 25 mg via INTRAVENOUS
  Filled 2023-10-19 (×2): qty 1

## 2023-10-19 MED ORDER — POTASSIUM CHLORIDE IN NACL 20-0.9 MEQ/L-% IV SOLN
INTRAVENOUS | Status: DC
  Filled 2023-10-19 (×3): qty 1000

## 2023-10-19 NOTE — Progress Notes (Signed)
PROGRESS NOTE    Tara Gonzalez  WUJ:[id number] DOB: 01-21-1996 DOA: 10/17/2023 PCP: Jerre Simon, MD  Outpatient Specialists: none    Brief Narrative:   Tara Gonzalez is a 28 y.o. African-American female with medical history significant for bipolar disorder, anxiety and depression, who presented to the emergency room with a Kalisetti of intractable nausea and vomiting with associated diarrhea with loose bowel movements over the last 5 days.  She has seen occasional bright red blood in her vomitus and occasional blood clots per her report.  She admits to upper abdominal pain and chills without reported fever.  No dysuria, oliguria or hematuria or flank pain.  She has been having occasional dyspnea and wheezing.  No cough or hemoptysis.  No other bleeding diathesis.    Assessment & Plan:   Principal Problem:   Acute gastroenteritis Active Problems:   GI bleeding   Influenza A   Intractable vomiting   Anxiety and depression  # Influenza A No respiratory symptoms and symptoms began about a week ago, main symptom appears to be nausea and vomiting - supportive care  # Nausea and vomiting # Hematemesis Several days nausea/vomiting now with hematemesis which the patient reports to be large in volume. Likely MW tear. Hgb has declined from 15.1 on 2/10 to 11s and stable there, likely come component of concentration/dilution. EGD performed 2/12 with healing ulcer, no evidence bleeding, kub unremarkable. Continues to complain of significant nausea/vomiting - continue anti-emetics - ppi at discharge - advance diet as able - continue fluids  # Chest pain Likely from vomiting, will check troponin. Not pleuritic and no dyspnea so doubt PE.  # Bipolar disorder Not on meds   DVT prophylaxis: SCDs Code Status: full Family Communication: none at bedside  Level of care: Med-Surg Status is: inpt    Consultants:  GI  Procedures: None thus far  Antimicrobials:  none     Subjective: Reports ongoing nausea with vomiting clear liquid just now, no bleeding.  Objective: Vitals:   10/19/23 0157 10/19/23 0503 10/19/23 0814 10/19/23 0940  BP: 113/69 99/66 132/87 (!) 146/92  Pulse: 66 75  76  Resp: (!) 26 20 15 17   Temp: 98.2 F (36.8 C) 98.3 F (36.8 C) 98.1 F (36.7 C) 98.5 F (36.9 C)  TempSrc:    Oral  SpO2: 98% 99% 100% 100%  Weight:      Height:        Intake/Output Summary (Last 24 hours) at 10/19/2023 1208 Last data filed at 10/18/2023 2247 Gross per 24 hour  Intake 804.12 ml  Output --  Net 804.12 ml   Filed Weights   10/17/23 1818  Weight: 52.2 kg    Examination:  General exam: Appears ill  Respiratory system: Clear to auscultation. Respiratory effort normal. Cardiovascular system: S1 & S2 heard, RRR.   Gastrointestinal system: Abdomen is nondistended, soft and nontender.    Central nervous system: Alert and oriented. No focal neurological deficits. Extremities: Symmetric 5 x 5 power. Skin: No rashes, lesions or ulcers Psychiatry: appears depressed     Data Reviewed: I have personally reviewed following labs and imaging studies  CBC: Recent Labs  Lab 10/16/23 1452 10/17/23 1825 10/18/23 0055 10/18/23 0523 10/18/23 1606 10/19/23 1021  WBC 4.3 3.9*  --  2.4*  --  5.8  NEUTROABS 2.5 2.5  --   --   --   --   HGB 15.1* 13.7 11.0* 10.2* 10.9* 11.2*  HCT 46.3* 40.8 33.4* 30.2* 32.4* 32.5*  MCV  93.3 90.9  --  89.9  --  89.5  PLT 125* 163  --  126*  --  131*   Basic Metabolic Panel: Recent Labs  Lab 10/16/23 1452 10/17/23 1825 10/18/23 0523 10/19/23 1021  NA 137 139 139 136  K 5.0 4.0 3.3* 3.6  CL 101 103 109 107  CO2 19* 21* 19* 19*  GLUCOSE 89 105* 74 69*  BUN 20 15 15 10   CREATININE 0.78 0.64 0.50 0.51  CALCIUM 9.8 9.0 7.9* 8.6*   GFR: Estimated Creatinine Clearance: 85 mL/min (by C-G formula based on SCr of 0.51 mg/dL). Liver Function Tests: Recent Labs  Lab 10/16/23 1452 10/17/23 1825  AST 34 31   ALT 31 29  ALKPHOS 60 52  BILITOT 1.1 0.7  PROT 8.7* 7.6  ALBUMIN 4.7 4.3   Recent Labs  Lab 10/17/23 1825  LIPASE 24   No results for input(s): "AMMONIA" in the last 168 hours. Coagulation Profile: No results for input(s): "INR", "PROTIME" in the last 168 hours. Cardiac Enzymes: No results for input(s): "CKTOTAL", "CKMB", "CKMBINDEX", "TROPONINI" in the last 168 hours. BNP (last 3 results) No results for input(s): "PROBNP" in the last 8760 hours. HbA1C: No results for input(s): "HGBA1C" in the last 72 hours. CBG: No results for input(s): "GLUCAP" in the last 168 hours. Lipid Profile: No results for input(s): "CHOL", "HDL", "LDLCALC", "TRIG", "CHOLHDL", "LDLDIRECT" in the last 72 hours. Thyroid Function Tests: No results for input(s): "TSH", "T4TOTAL", "FREET4", "T3FREE", "THYROIDAB" in the last 72 hours. Anemia Panel: No results for input(s): "VITAMINB12", "FOLATE", "FERRITIN", "TIBC", "IRON", "RETICCTPCT" in the last 72 hours. Urine analysis:    Component Value Date/Time   COLORURINE YELLOW (A) 07/31/2022 0806   APPEARANCEUR HAZY (A) 07/31/2022 0806   LABSPEC 1.024 07/31/2022 0806   PHURINE 8.0 07/31/2022 0806   GLUCOSEU NEGATIVE 07/31/2022 0806   HGBUR NEGATIVE 07/31/2022 0806   BILIRUBINUR NEGATIVE 07/31/2022 0806   BILIRUBINUR negative 05/31/2017 1553   BILIRUBINUR NEG 08/02/2016 1004   KETONESUR NEGATIVE 07/31/2022 0806   PROTEINUR 100 (A) 07/31/2022 0806   UROBILINOGEN 0.2 05/31/2017 1553   UROBILINOGEN 1.0 04/23/2014 1439   NITRITE NEGATIVE 07/31/2022 0806   LEUKOCYTESUR NEGATIVE 07/31/2022 0806   Sepsis Labs: @LABRCNTIP (procalcitonin:4,lacticidven:4)  ) Recent Results (from the past 240 hours)  Resp panel by RT-PCR (RSV, Flu A&B, Covid) Anterior Nasal Swab     Status: Abnormal   Collection Time: 10/16/23  2:52 PM   Specimen: Anterior Nasal Swab  Result Value Ref Range Status   SARS Coronavirus 2 by RT PCR NEGATIVE NEGATIVE Final    Comment:  (NOTE) SARS-CoV-2 target nucleic acids are NOT DETECTED.  The SARS-CoV-2 RNA is generally detectable in upper respiratory specimens during the acute phase of infection. The lowest concentration of SARS-CoV-2 viral copies this assay can detect is 138 copies/mL. A negative result does not preclude SARS-Cov-2 infection and should not be used as the sole basis for treatment or other patient management decisions. A negative result may occur with  improper specimen collection/handling, submission of specimen other than nasopharyngeal swab, presence of viral mutation(s) within the areas targeted by this assay, and inadequate number of viral copies(<138 copies/mL). A negative result must be combined with clinical observations, patient history, and epidemiological information. The expected result is Negative.  Fact Sheet for Patients:  BloggerCourse.com  Fact Sheet for Healthcare Providers:  SeriousBroker.it  This test is no t yet approved or cleared by the [place] and  has been authorized for  detection and/or diagnosis of SARS-CoV-2 by FDA under an Emergency Use Authorization (EUA). This EUA will remain  in effect (meaning this test can be used) for the duration of the COVID-19 declaration under Section 564(b)(1) of the Act, 21 U.S.C.section 360bbb-3(b)(1), unless the authorization is terminated  or revoked sooner.       Influenza A by PCR POSITIVE (A) NEGATIVE Final   Influenza B by PCR NEGATIVE NEGATIVE Final    Comment: (NOTE) The Xpert Xpress SARS-CoV-2/FLU/RSV plus assay is intended as an aid in the diagnosis of influenza from Nasopharyngeal swab specimens and should not be used as a sole basis for treatment. Nasal washings and aspirates are unacceptable for Xpert Xpress SARS-CoV-2/FLU/RSV testing.  Fact Sheet for Patients: BloggerCourse.com  Fact Sheet for Healthcare  Providers: SeriousBroker.it  This test is not yet approved or cleared by the [place] FDA and has been authorized for detection and/or diagnosis of SARS-CoV-2 by FDA under an Emergency Use Authorization (EUA). This EUA will remain in effect (meaning this test can be used) for the duration of the COVID-19 declaration under Section 564(b)(1) of the Act, 21 U.S.C. section 360bbb-3(b)(1), unless the authorization is terminated or revoked.     Resp Syncytial Virus by PCR NEGATIVE NEGATIVE Final    Comment: (NOTE) Fact Sheet for Patients: BloggerCourse.com  Fact Sheet for Healthcare Providers: SeriousBroker.it  This test is not yet approved or cleared by the [place] FDA and has been authorized for detection and/or diagnosis of SARS-CoV-2 by FDA under an Emergency Use Authorization (EUA). This EUA will remain in effect (meaning this test can be used) for the duration of the COVID-19 declaration under Section 564(b)(1) of the Act, 21 U.S.C. section 360bbb-3(b)(1), unless the authorization is terminated or revoked.  Performed at [facility], [address], [place], [place] [address]          Radiology Studies: DG Abd 1 View Result Date: 10/18/2023 CLINICAL DATA:  [id number] Nausea AND vomiting [id number] EXAM: ABDOMEN - 1 VIEW COMPARISON:  07/31/2022 FINDINGS: The bowel gas pattern is normal. No radio-opaque calculi or other significant radiographic abnormality are seen. IMPRESSION: Negative. Electronically Signed   By: Duanne Guess D.O.   On: 10/18/2023 11:23        Scheduled Meds:  calcium carbonate  1 tablet Oral Once   Continuous Infusions:     LOS: 1 day     Silvano Bilis, MD [facility]   If 7PM-7AM, please contact night-coverage [email or web address] Password [facility] 10/19/2023, 12:08 PM

## 2023-10-19 NOTE — TOC CM/SW Note (Signed)
Transition of Care [facility]) - Inpatient Brief Assessment   Patient Details  Name: Tara Gonzalez MRN: [id number] Date of Birth: 04-16-1996  Transition of [facility]) CM/SW Contact:    Allena Katz, LCSW Phone Number: 10/19/2023, 4:10 PM   Clinical Narrative:    Transition of Care Asessment: Insurance and Status: Insurance coverage has been reviewed Patient has primary care physician: Yes Home environment has been reviewed: [id number] [place] [id number]- Prior level of function:: independent Prior/Current Home Services: No current home services Social Drivers of Health Review: SDOH reviewed no interventions necessary Readmission risk has been reviewed: Yes Transition of care needs: no transition of care needs at this time

## 2023-10-19 NOTE — Progress Notes (Signed)
MEWS Progress Note  Patient Details Name: Tara Gonzalez MRN: [id number] DOB: [birth year] Today's Date: 10/19/2023   MEWS Flowsheet Documentation:  Assess: MEWS Score Temp: 98.2 F (36.8 C) BP: 113/69 MAP (mmHg): 84 Pulse Rate: 66 ECG Heart Rate: 90 Resp: (!) 26 Level of Consciousness: Alert SpO2: 98 % O2 Device: Room Air Patient Activity (if Appropriate): In bed O2 Flow Rate (L/min): 4 L/min Assess: MEWS Score MEWS Temp: 0 MEWS Systolic: 0 MEWS Pulse: 0 MEWS RR: 2 MEWS LOC: 0 MEWS Score: 2 MEWS Score Color: Yellow Assess: SIRS CRITERIA SIRS Temperature : 0 SIRS Respirations : 1 SIRS Pulse: 0 SIRS WBC: 0 SIRS Score Sum : 1 SIRS Temperature : 0 SIRS Pulse: 0 SIRS Respirations : 1 SIRS WBC: 0 SIRS Score Sum : 1 Assess: if the MEWS score is Yellow or Red Were vital signs accurate and taken at a resting state?: Yes Does the patient meet 2 or more of the SIRS criteria?: No Does the patient have a confirmed or suspected source of infection?: Yes MEWS guidelines implemented : No, previously yellow, continue vital signs every 4 hours Treat MEWS Interventions: Considered administering scheduled or prn medications/treatments as ordered Take Vital Signs Increase Vital Sign Frequency : Yellow: Q2hr x1, continue Q4hrs until patient remains green for 12hrs Escalate MEWS: Escalate: Yellow: Discuss with charge nurse and consider notifying provider and/or RRT        Ernest Mallick 10/19/2023, 2:00 AM

## 2023-10-19 NOTE — Progress Notes (Signed)
Patient complains for pressure like chest pain and states she has had this since coming to the hospital. RN does not see where this is documented in the physician notes or by any other nurse.   RN notified Larkin Ina, NP of the above information   Verbal orders Received: obtain EKG, administer tums.  Patient refusing prn tylenol PO and suppository options. Patient refusing Tums. Patient states she is unable to take anything PO d/t nausea.  RN notified Larkin Ina, NP of patient's medication refusals.   RN asked for IV tylenol for the patient.   Order for IV tylenol received.

## 2023-10-19 NOTE — Progress Notes (Signed)
The patient had an EGD without any active bleeding yesterday. No further bleeding. All symptoms likely due to her viral infection. Avoid NSAID's in the future.  I will sign off.  Please call if any further GI concerns or questions.  We would like to thank you for the opportunity to participate in the care of Tara Gonzalez.

## 2023-10-20 ENCOUNTER — Inpatient Hospital Stay: Payer: Medicaid Other

## 2023-10-20 ENCOUNTER — Encounter: Payer: Self-pay | Admitting: Gastroenterology

## 2023-10-20 ENCOUNTER — Encounter: Payer: Self-pay | Admitting: Obstetrics and Gynecology

## 2023-10-20 DIAGNOSIS — Z9049 Acquired absence of other specified parts of digestive tract: Secondary | ICD-10-CM | POA: Diagnosis not present

## 2023-10-20 DIAGNOSIS — R1111 Vomiting without nausea: Secondary | ICD-10-CM | POA: Diagnosis not present

## 2023-10-20 DIAGNOSIS — K529 Noninfective gastroenteritis and colitis, unspecified: Secondary | ICD-10-CM | POA: Diagnosis not present

## 2023-10-20 LAB — COMPREHENSIVE METABOLIC PANEL
ALT: 33 U/L (ref 0–44)
AST: 29 U/L (ref 15–41)
Albumin: 3.7 g/dL (ref 3.5–5.0)
Alkaline Phosphatase: 41 U/L (ref 38–126)
Anion gap: 16 — ABNORMAL HIGH (ref 5–15)
BUN: 9 mg/dL (ref 6–20)
CO2: 17 mmol/L — ABNORMAL LOW (ref 22–32)
Calcium: 8.6 mg/dL — ABNORMAL LOW (ref 8.9–10.3)
Chloride: 103 mmol/L (ref 98–111)
Creatinine, Ser: 0.57 mg/dL (ref 0.44–1.00)
GFR, Estimated: >60 mL/min (ref >60)
Glucose, Bld: 68 mg/dL — ABNORMAL LOW (ref 70–99)
Potassium: 3.2 mmol/L — ABNORMAL LOW (ref 3.5–5.1)
Sodium: 136 mmol/L (ref 135–145)
Total Bilirubin: 1 mg/dL (ref 0.0–1.2)
Total Protein: 6.6 g/dL (ref 6.5–8.1)

## 2023-10-20 LAB — LIPASE, BLOOD: Lipase: 61 U/L — ABNORMAL HIGH (ref 11–51)

## 2023-10-20 LAB — LACTIC ACID, PLASMA: Lactic Acid, Venous: 0.9 mmol/L (ref 0.5–1.9)

## 2023-10-20 LAB — CBC
HCT: 35.7 % — ABNORMAL LOW (ref 36.0–46.0)
Hemoglobin: 12 g/dL (ref 12.0–15.0)
MCH: 30.2 pg (ref 26.0–34.0)
MCHC: 33.6 g/dL (ref 30.0–36.0)
MCV: 89.7 fL (ref 80.0–100.0)
Platelets: 140 10*3/uL — ABNORMAL LOW (ref 150–400)
RBC: 3.98 MIL/uL (ref 3.87–5.11)
RDW: 12.7 % (ref 11.5–15.5)
WBC: 3.4 10*3/uL — ABNORMAL LOW (ref 4.0–10.5)
nRBC: 0 % (ref 0.0–0.2)

## 2023-10-20 LAB — MAGNESIUM: Magnesium: 1.8 mg/dL (ref 1.7–2.4)

## 2023-10-20 LAB — GLUCOSE, CAPILLARY: Glucose-Capillary: 80 mg/dL (ref 70–99)

## 2023-10-20 MED ORDER — MELATONIN 5 MG PO TABS
5.0000 mg | ORAL_TABLET | Freq: Every day | ORAL | Status: DC
  Filled 2023-10-20: qty 1

## 2023-10-20 MED ORDER — MAGNESIUM SULFATE IN D5W 1-5 GM/100ML-% IV SOLN
1.0000 g | Freq: Once | INTRAVENOUS | Status: DC
  Filled 2023-10-20: qty 100

## 2023-10-20 MED ORDER — KCL IN DEXTROSE-NACL 20-5-0.45 MEQ/L-%-% IV SOLN
INTRAVENOUS | Status: DC
  Filled 2023-10-20 (×3): qty 1000

## 2023-10-20 MED ORDER — IOHEXOL 300 MG/ML  SOLN
80.0000 mL | Freq: Once | INTRAMUSCULAR | Status: AC | PRN
  Administered 2023-10-20: 80 mL via INTRAVENOUS

## 2023-10-20 MED ORDER — MORPHINE SULFATE (PF) 2 MG/ML IV SOLN
1.0000 mg | INTRAVENOUS | Status: AC | PRN
  Administered 2023-10-20 (×2): 1 mg via INTRAVENOUS
  Filled 2023-10-20 (×2): qty 1

## 2023-10-20 MED ORDER — IOHEXOL 300 MG/ML  SOLN: 30.0000 mL | Freq: Once | INTRAMUSCULAR | Status: DC | PRN

## 2023-10-20 MED ORDER — ONDANSETRON 4 MG PO TBDP: 4.0000 mg | ORAL_TABLET | Freq: Three times a day (TID) | ORAL | 0 refills | Status: DC | PRN

## 2023-10-20 MED ORDER — PANTOPRAZOLE SODIUM 40 MG IV SOLR
40.0000 mg | Freq: Two times a day (BID) | INTRAVENOUS | Status: DC
  Administered 2023-10-20: 40 mg via INTRAVENOUS
  Filled 2023-10-20: qty 10

## 2023-10-20 MED ORDER — PANTOPRAZOLE SODIUM 40 MG PO TBEC: 40.0000 mg | DELAYED_RELEASE_TABLET | Freq: Every day | ORAL | 1 refills | Status: DC

## 2023-10-20 MED ORDER — SODIUM CHLORIDE 0.9 % IV SOLN
12.5000 mg | Freq: Once | INTRAVENOUS | Status: AC
  Administered 2023-10-20: 12.5 mg via INTRAVENOUS
  Filled 2023-10-20: qty 12.5

## 2023-10-20 MED ORDER — PROMETHAZINE HCL 12.5 MG PO TABS: 12.5000 mg | ORAL_TABLET | Freq: Four times a day (QID) | ORAL | 0 refills | Status: DC | PRN

## 2023-10-20 NOTE — Plan of Care (Signed)

## 2023-10-20 NOTE — Discharge Summary (Signed)
Tara Gonzalez NFA:[id number] DOB: [birth year] DOA: 10/17/2023  PCP: Jerre Simon, MD  Admit date: 10/17/2023 Discharge date: 10/20/2023  Time spent: 35 minutes  Recommendations for Outpatient Follow-up:  Pcp f/u 1 week, check bmp then     Discharge Diagnoses:  Principal Problem:   Acute gastroenteritis Active Problems:   GI bleeding   Influenza A   Intractable vomiting   Anxiety and depression   Discharge Condition: stable  Diet recommendation: regular  Filed Weights   10/17/23 1818  Weight: 52.2 kg    History of present illness:  From admission h and p by dr. Arville Care: Tara Gonzalez is a 28 y.o. African-American female with medical history significant for bipolar disorder, anxiety and depression, who presented to the emergency room with a Kalisetti of intractable nausea and vomiting with associated diarrhea with loose bowel movements over the last 5 days.  She has seen occasional bright red blood in her vomitus and occasional blood clots per her report.  She admits to upper abdominal pain and chills without reported fever.  No dysuria, oliguria or hematuria or flank pain.  She has been having occasional dyspnea and wheezing.  No cough or hemoptysis.  No other bleeding diathesis.    Hospital Course:  Patient presents with nausea, vomiting, and report of hematemesis. Found to be influenza a positive. Taken for EGD given report of hematemesis. No signs of bleeding on EGD, did show healing gastric ulcer and gastritis. Patient with ongoing nausea, vomiting, and abdominal pain. Was maintained on fluids and CT of abdomen was obtained given report of persistent pain. CT unremarkable. On afternoon of discharge patient requests discharge, says last vomiting was that morning, abdominal pain improving, and wants to go home. Discharged with PPI and anti-emetics, advised close pcp f/u next week, would check metabolic panel then.   Procedures: EGD   Consultations: gastroenterology  Discharge  Exam: Vitals:   10/20/23 0752 10/20/23 1629  BP: (!) 125/96 (!) 132/100  Pulse: 82 73  Resp: 18 18  Temp: 98.3 F (36.8 C) 98.4 F (36.9 C)  SpO2: 99% 100%    General: NAD Cardiovascular: RRR Respiratory: CTAB Abdomen: soft, diffuse mild tenderness  Discharge Instructions   Discharge Instructions     Diet - low sodium heart healthy   Complete by: As directed    Increase activity slowly   Complete by: As directed       Allergies as of 10/20/2023   No Known Allergies      Medication List     TAKE these medications    ondansetron 4 MG disintegrating tablet Commonly known as: ZOFRAN-ODT Take 1 tablet (4 mg total) by mouth every 8 (eight) hours as needed for nausea or vomiting.   pantoprazole 40 MG tablet Commonly known as: Protonix Take 1 tablet (40 mg total) by mouth daily.   polyethylene glycol 17 g packet Commonly known as: MIRALAX / GLYCOLAX Take 17 g by mouth daily as needed for mild constipation.   promethazine 12.5 MG tablet Commonly known as: PHENERGAN Take 1 tablet (12.5 mg total) by mouth every 6 (six) hours as needed for nausea or vomiting.       No Known Allergies  Follow-up Information     Jerre Simon, MD Follow up.   Specialty: Family Medicine Contact information: [address] [place] [address] [phone]                  The results of significant diagnostics from this hospitalization (including imaging, microbiology,  ancillary and laboratory) are listed below for reference.    Significant Diagnostic Studies: CT ABDOMEN PELVIS W CONTRAST Result Date: 10/20/2023 CLINICAL DATA:  Acute, non localized abdominal pain with nausea and vomiting. EXAM: CT ABDOMEN AND PELVIS WITH CONTRAST TECHNIQUE: Multidetector CT imaging of the abdomen and pelvis was performed using the standard protocol following bolus administration of intravenous contrast. RADIATION DOSE REDUCTION: This exam was performed according to the departmental  dose-optimization program which includes automated exposure control, adjustment of the mA and/or kV according to patient size and/or use of iterative reconstruction technique. CONTRAST:  80mL OMNIPAQUE IOHEXOL 300 MG/ML  SOLN COMPARISON:  07/31/2022 and 06/25/2020 FINDINGS: Lower chest: Unremarkable. Hepatobiliary: Focal fat deposition in the liver adjacent to the falciform ligament. Surgically absent gallbladder. Pancreas: Unremarkable. No pancreatic ductal dilatation or surrounding inflammatory changes. Spleen: Normal in size without focal abnormality. Adrenals/Urinary Tract: Adrenal glands are unremarkable. Kidneys are normal, without renal calculi, focal lesion, or hydronephrosis. Bladder is distended and unremarkable. Stomach/Bowel: Mildly progressive diffuse low density antral wall thickening, remaining under distended. Unremarkable small bowel, colon and appendix. Vascular/Lymphatic: No significant vascular findings are present. No enlarged abdominal or pelvic lymph nodes. Reproductive: Uterus and bilateral adnexa are unremarkable. Other: No abdominal wall hernia or abnormality. No abdominopelvic ascites. Musculoskeletal: Unremarkable bones. IMPRESSION: 1. Mildly progressive chronic diffuse low density antral wall thickening, remaining under distended. This could be due chronic gastritis or normal under distended appearance. 2. Otherwise, unremarkable examination. Electronically Signed   By: Beckie Salts M.D.   On: 10/20/2023 16:46   DG Abd 1 View Result Date: 10/18/2023 CLINICAL DATA:  [id number] Nausea AND vomiting [id number] EXAM: ABDOMEN - 1 VIEW COMPARISON:  07/31/2022 FINDINGS: The bowel gas pattern is normal. No radio-opaque calculi or other significant radiographic abnormality are seen. IMPRESSION: Negative. Electronically Signed   By: Duanne Guess D.O.   On: 10/18/2023 11:23    Microbiology: Recent Results (from the past 240 hours)  Resp panel by RT-PCR (RSV, Flu A&B, Covid) Anterior Nasal Swab      Status: Abnormal   Collection Time: 10/16/23  2:52 PM   Specimen: Anterior Nasal Swab  Result Value Ref Range Status   SARS Coronavirus 2 by RT PCR NEGATIVE NEGATIVE Final    Comment: (NOTE) SARS-CoV-2 target nucleic acids are NOT DETECTED.  The SARS-CoV-2 RNA is generally detectable in upper respiratory specimens during the acute phase of infection. The lowest concentration of SARS-CoV-2 viral copies this assay can detect is 138 copies/mL. A negative result does not preclude SARS-Cov-2 infection and should not be used as the sole basis for treatment or other patient management decisions. A negative result may occur with  improper specimen collection/handling, submission of specimen other than nasopharyngeal swab, presence of viral mutation(s) within the areas targeted by this assay, and inadequate number of viral copies(<138 copies/mL). A negative result must be combined with clinical observations, patient history, and epidemiological information. The expected result is Negative.  Fact Sheet for Patients:  BloggerCourse.com  Fact Sheet for Healthcare Providers:  SeriousBroker.it  This test is no t yet approved or cleared by the [place] FDA and  has been authorized for detection and/or diagnosis of SARS-CoV-2 by FDA under an Emergency Use Authorization (EUA). This EUA will remain  in effect (meaning this test can be used) for the duration of the COVID-19 declaration under Section 564(b)(1) of the Act, 21 U.S.C.section 360bbb-3(b)(1), unless the authorization is terminated  or revoked sooner.       Influenza A by PCR  POSITIVE (A) NEGATIVE Final   Influenza B by PCR NEGATIVE NEGATIVE Final    Comment: (NOTE) The Xpert Xpress SARS-CoV-2/FLU/RSV plus assay is intended as an aid in the diagnosis of influenza from Nasopharyngeal swab specimens and should not be used as a sole basis for treatment. Nasal washings  and aspirates are unacceptable for Xpert Xpress SARS-CoV-2/FLU/RSV testing.  Fact Sheet for Patients: BloggerCourse.com  Fact Sheet for Healthcare Providers: SeriousBroker.it  This test is not yet approved or cleared by the [place] FDA and has been authorized for detection and/or diagnosis of SARS-CoV-2 by FDA under an Emergency Use Authorization (EUA). This EUA will remain in effect (meaning this test can be used) for the duration of the COVID-19 declaration under Section 564(b)(1) of the Act, 21 U.S.C. section 360bbb-3(b)(1), unless the authorization is terminated or revoked.     Resp Syncytial Virus by PCR NEGATIVE NEGATIVE Final    Comment: (NOTE) Fact Sheet for Patients: BloggerCourse.com  Fact Sheet for Healthcare Providers: SeriousBroker.it  This test is not yet approved or cleared by the [place] FDA and has been authorized for detection and/or diagnosis of SARS-CoV-2 by FDA under an Emergency Use Authorization (EUA). This EUA will remain in effect (meaning this test can be used) for the duration of the COVID-19 declaration under Section 564(b)(1) of the Act, 21 U.S.C. section 360bbb-3(b)(1), unless the authorization is terminated or revoked.  Performed at [facility], [address] [place], [place] [address]      Labs: Basic Metabolic Panel: Recent Labs  Lab 10/16/23 1452 10/17/23 1825 10/18/23 0523 10/19/23 1021 10/20/23 1005  NA 137 139 139 136 136  K 5.0 4.0 3.3* 3.6 3.2*  CL 101 103 109 107 103  CO2 19* 21* 19* 19* 17*  GLUCOSE 89 105* 74 69* 68*  BUN 20 15 15 10 9   CREATININE 0.78 0.64 0.50 0.51 0.57  CALCIUM 9.8 9.0 7.9* 8.6* 8.6*  MG  --   --   --   --  1.8   Liver Function Tests: Recent Labs  Lab 10/16/23 1452 10/17/23 1825 10/20/23 1005  AST 34 31 29  ALT 31 29 33  ALKPHOS 60 52 41  BILITOT 1.1 0.7 1.0  PROT  8.7* 7.6 6.6  ALBUMIN 4.7 4.3 3.7   Recent Labs  Lab 10/17/23 1825 10/20/23 1005  LIPASE 24 61*   No results for input(s): "AMMONIA" in the last 168 hours. CBC: Recent Labs  Lab 10/16/23 1452 10/17/23 1825 10/18/23 0055 10/18/23 0523 10/18/23 1606 10/19/23 1021 10/20/23 1005  WBC 4.3 3.9*  --  2.4*  --  5.8 3.4*  NEUTROABS 2.5 2.5  --   --   --   --   --   HGB 15.1* 13.7 11.0* 10.2* 10.9* 11.2* 12.0  HCT 46.3* 40.8 33.4* 30.2* 32.4* 32.5* 35.7*  MCV 93.3 90.9  --  89.9  --  89.5 89.7  PLT 125* 163  --  126*  --  131* 140*   Cardiac Enzymes: No results for input(s): "CKTOTAL", "CKMB", "CKMBINDEX", "TROPONINI" in the last 168 hours. BNP: BNP (last 3 results) No results for input(s): "BNP" in the last 8760 hours.  ProBNP (last 3 results) No results for input(s): "PROBNP" in the last 8760 hours.  CBG: Recent Labs  Lab 10/20/23 0051  GLUCAP 80       Signed:  Silvano Bilis MD.  [facility] 10/20/2023, 5:01 PM

## 2023-10-20 NOTE — Progress Notes (Signed)
PROGRESS NOTE    Tara Gonzalez  RUE:[id number] DOB: 26-Sep-1995 DOA: 10/17/2023 PCP: Jerre Simon, MD  Outpatient Specialists: none    Brief Narrative:   Tara Gonzalez is a 28 y.o. African-American female with medical history significant for bipolar disorder, anxiety and depression, who presented to the emergency room with a Kalisetti of intractable nausea and vomiting with associated diarrhea with loose bowel movements over the last 5 days.  She has seen occasional bright red blood in her vomitus and occasional blood clots per her report.  She admits to upper abdominal pain and chills without reported fever.  No dysuria, oliguria or hematuria or flank pain.  She has been having occasional dyspnea and wheezing.  No cough or hemoptysis.  No other bleeding diathesis.    Assessment & Plan:   Principal Problem:   Acute gastroenteritis Active Problems:   GI bleeding   Influenza A   Intractable vomiting   Anxiety and depression  # Influenza A No respiratory symptoms and symptoms began about a week ago, main symptom appears to be nausea and vomiting - supportive care  # Nausea and vomiting # Hematemesis # Abdominal pain Several days nausea/vomiting with hematemesis which the patient reports to be large in volume. Likely MW tear. Hgb has declined from 15.1 on 2/10 to 11s and stable there, likely come component of concentration/dilution. EGD performed 2/12 with healing ulcer, no evidence bleeding, kub unremarkable. Continues to complain of significant nausea/vomiting and abdominal pain. Hematemesis is resolved. LFTs wnl, lipase mildly elevated, lactate normal but bicarb low. History cholecystectomy. Pancreatitis? - continue anti-emetics - continue ppi - advance diet as able - check CT scan - continue fluids, may need bicarb  # Hypokalemia - kcl with fluids - f/u mg  # Bipolar disorder Not on meds   DVT prophylaxis: SCDs Code Status: full Family Communication: mother updated  telephonically 2/14  Level of care: Med-Surg Status is: inpt    Consultants:  GI  Procedures: None thus far  Antimicrobials:  none    Subjective: Reports ongoing nausea with vomiting clear liquid just now, no bleeding.  Objective: Vitals:   10/19/23 1829 10/19/23 2112 10/20/23 0405 10/20/23 0752  BP: 139/87 (!) 142/91 (!) 140/99 (!) 125/96  Pulse: 85 69 64 82  Resp: (!) 22 14 14 18   Temp:  99.3 F (37.4 C) 98.4 F (36.9 C) 98.3 F (36.8 C)  TempSrc:      SpO2: 100% 100% 100% 99%  Weight:      Height:        Intake/Output Summary (Last 24 hours) at 10/20/2023 1357 Last data filed at 10/20/2023 0318 Gross per 24 hour  Intake 1334.62 ml  Output --  Net 1334.62 ml   Filed Weights   10/17/23 1818  Weight: 52.2 kg    Examination:  General exam: Appears ill  Respiratory system: Clear to auscultation. Respiratory effort normal. Cardiovascular system: S1 & S2 heard, RRR.   Gastrointestinal system: Abdomen is nondistended, soft and tender upper mid abdomen  Central nervous system: Alert and oriented. No focal neurological deficits. Extremities: Symmetric 5 x 5 power. Skin: No rashes, lesions or ulcers Psychiatry: appears depressed     Data Reviewed: I have personally reviewed following labs and imaging studies  CBC: Recent Labs  Lab 10/16/23 1452 10/17/23 1825 10/18/23 0055 10/18/23 0523 10/18/23 1606 10/19/23 1021 10/20/23 1005  WBC 4.3 3.9*  --  2.4*  --  5.8 3.4*  NEUTROABS 2.5 2.5  --   --   --   --   --  HGB 15.1* 13.7 11.0* 10.2* 10.9* 11.2* 12.0  HCT 46.3* 40.8 33.4* 30.2* 32.4* 32.5* 35.7*  MCV 93.3 90.9  --  89.9  --  89.5 89.7  PLT 125* 163  --  126*  --  131* 140*   Basic Metabolic Panel: Recent Labs  Lab 10/16/23 1452 10/17/23 1825 10/18/23 0523 10/19/23 1021 10/20/23 1005  NA 137 139 139 136 136  K 5.0 4.0 3.3* 3.6 3.2*  CL 101 103 109 107 103  CO2 19* 21* 19* 19* 17*  GLUCOSE 89 105* 74 69* 68*  BUN 20 15 15 10 9    CREATININE 0.78 0.64 0.50 0.51 0.57  CALCIUM 9.8 9.0 7.9* 8.6* 8.6*   GFR: Estimated Creatinine Clearance: 85 mL/min (by C-G formula based on SCr of 0.57 mg/dL). Liver Function Tests: Recent Labs  Lab 10/16/23 1452 10/17/23 1825 10/20/23 1005  AST 34 31 29  ALT 31 29 33  ALKPHOS 60 52 41  BILITOT 1.1 0.7 1.0  PROT 8.7* 7.6 6.6  ALBUMIN 4.7 4.3 3.7   Recent Labs  Lab 10/17/23 1825 10/20/23 1005  LIPASE 24 61*   No results for input(s): "AMMONIA" in the last 168 hours. Coagulation Profile: No results for input(s): "INR", "PROTIME" in the last 168 hours. Cardiac Enzymes: No results for input(s): "CKTOTAL", "CKMB", "CKMBINDEX", "TROPONINI" in the last 168 hours. BNP (last 3 results) No results for input(s): "PROBNP" in the last 8760 hours. HbA1C: No results for input(s): "HGBA1C" in the last 72 hours. CBG: Recent Labs  Lab 10/20/23 0051  GLUCAP 80   Lipid Profile: No results for input(s): "CHOL", "HDL", "LDLCALC", "TRIG", "CHOLHDL", "LDLDIRECT" in the last 72 hours. Thyroid Function Tests: No results for input(s): "TSH", "T4TOTAL", "FREET4", "T3FREE", "THYROIDAB" in the last 72 hours. Anemia Panel: No results for input(s): "VITAMINB12", "FOLATE", "FERRITIN", "TIBC", "IRON", "RETICCTPCT" in the last 72 hours. Urine analysis:    Component Value Date/Time   COLORURINE YELLOW (A) 07/31/2022 0806   APPEARANCEUR HAZY (A) 07/31/2022 0806   LABSPEC 1.024 07/31/2022 0806   PHURINE 8.0 07/31/2022 0806   GLUCOSEU NEGATIVE 07/31/2022 0806   HGBUR NEGATIVE 07/31/2022 0806   BILIRUBINUR NEGATIVE 07/31/2022 0806   BILIRUBINUR negative 05/31/2017 1553   BILIRUBINUR NEG 08/02/2016 1004   KETONESUR NEGATIVE 07/31/2022 0806   PROTEINUR 100 (A) 07/31/2022 0806   UROBILINOGEN 0.2 05/31/2017 1553   UROBILINOGEN 1.0 04/23/2014 1439   NITRITE NEGATIVE 07/31/2022 0806   LEUKOCYTESUR NEGATIVE 07/31/2022 0806   Sepsis Labs: @LABRCNTIP (procalcitonin:4,lacticidven:4)  ) Recent  Results (from the past 240 hours)  Resp panel by RT-PCR (RSV, Flu A&B, Covid) Anterior Nasal Swab     Status: Abnormal   Collection Time: 10/16/23  2:52 PM   Specimen: Anterior Nasal Swab  Result Value Ref Range Status   SARS Coronavirus 2 by RT PCR NEGATIVE NEGATIVE Final    Comment: (NOTE) SARS-CoV-2 target nucleic acids are NOT DETECTED.  The SARS-CoV-2 RNA is generally detectable in upper respiratory specimens during the acute phase of infection. The lowest concentration of SARS-CoV-2 viral copies this assay can detect is 138 copies/mL. A negative result does not preclude SARS-Cov-2 infection and should not be used as the sole basis for treatment or other patient management decisions. A negative result may occur with  improper specimen collection/handling, submission of specimen other than nasopharyngeal swab, presence of viral mutation(s) within the areas targeted by this assay, and inadequate number of viral copies(<138 copies/mL). A negative result must be combined with clinical observations, patient history, and  epidemiological information. The expected result is Negative.  Fact Sheet for Patients:  BloggerCourse.com  Fact Sheet for Healthcare Providers:  SeriousBroker.it  This test is no t yet approved or cleared by the [place] FDA and  has been authorized for detection and/or diagnosis of SARS-CoV-2 by FDA under an Emergency Use Authorization (EUA). This EUA will remain  in effect (meaning this test can be used) for the duration of the COVID-19 declaration under Section 564(b)(1) of the Act, 21 U.S.C.section 360bbb-3(b)(1), unless the authorization is terminated  or revoked sooner.       Influenza A by PCR POSITIVE (A) NEGATIVE Final   Influenza B by PCR NEGATIVE NEGATIVE Final    Comment: (NOTE) The Xpert Xpress SARS-CoV-2/FLU/RSV plus assay is intended as an aid in the diagnosis of influenza from  Nasopharyngeal swab specimens and should not be used as a sole basis for treatment. Nasal washings and aspirates are unacceptable for Xpert Xpress SARS-CoV-2/FLU/RSV testing.  Fact Sheet for Patients: BloggerCourse.com  Fact Sheet for Healthcare Providers: SeriousBroker.it  This test is not yet approved or cleared by the [place] FDA and has been authorized for detection and/or diagnosis of SARS-CoV-2 by FDA under an Emergency Use Authorization (EUA). This EUA will remain in effect (meaning this test can be used) for the duration of the COVID-19 declaration under Section 564(b)(1) of the Act, 21 U.S.C. section 360bbb-3(b)(1), unless the authorization is terminated or revoked.     Resp Syncytial Virus by PCR NEGATIVE NEGATIVE Final    Comment: (NOTE) Fact Sheet for Patients: BloggerCourse.com  Fact Sheet for Healthcare Providers: SeriousBroker.it  This test is not yet approved or cleared by the [place] FDA and has been authorized for detection and/or diagnosis of SARS-CoV-2 by FDA under an Emergency Use Authorization (EUA). This EUA will remain in effect (meaning this test can be used) for the duration of the COVID-19 declaration under Section 564(b)(1) of the Act, 21 U.S.C. section 360bbb-3(b)(1), unless the authorization is terminated or revoked.  Performed at [facility], [address] [place], [place] [address]          Radiology Studies: No results found.       Scheduled Meds:  calcium carbonate  1 tablet Oral Once   melatonin  5 mg Oral QHS   Continuous Infusions:  dextrose 5 % and 0.45 % NaCl with KCl 20 mEq/L 125 mL/hr at 10/20/23 1206      LOS: 2 days     Silvano Bilis, MD [facility]   If 7PM-7AM, please contact night-coverage [email or web address] Password [facility] 10/20/2023, 1:57 PM

## 2023-10-21 ENCOUNTER — Emergency Department
Admission: EM | Admit: 2023-10-21 | Discharge: 2023-10-21 | Discharge status: Against Medical Advice | Payer: Medicaid Other | Attending: Emergency Medicine | Admitting: Emergency Medicine

## 2023-10-21 ENCOUNTER — Other Ambulatory Visit: Payer: Self-pay

## 2023-10-21 ENCOUNTER — Emergency Department (HOSPITAL_COMMUNITY)
Admission: EM | Admit: 2023-10-21 | Discharge: 2023-10-21 | Discharge status: Against Medical Advice | Payer: Medicaid Other | Attending: Emergency Medicine | Admitting: Emergency Medicine

## 2023-10-21 ENCOUNTER — Encounter (HOSPITAL_COMMUNITY): Payer: Self-pay | Admitting: *Deleted

## 2023-10-21 DIAGNOSIS — R110 Nausea: Secondary | ICD-10-CM | POA: Diagnosis not present

## 2023-10-21 DIAGNOSIS — R197 Diarrhea, unspecified: Secondary | ICD-10-CM | POA: Insufficient documentation

## 2023-10-21 DIAGNOSIS — Z5321 Procedure and treatment not carried out due to patient leaving prior to being seen by health care provider: Secondary | ICD-10-CM | POA: Diagnosis not present

## 2023-10-21 DIAGNOSIS — R109 Unspecified abdominal pain: Secondary | ICD-10-CM | POA: Insufficient documentation

## 2023-10-21 DIAGNOSIS — R079 Chest pain, unspecified: Secondary | ICD-10-CM | POA: Diagnosis not present

## 2023-10-21 DIAGNOSIS — R1111 Vomiting without nausea: Secondary | ICD-10-CM | POA: Diagnosis not present

## 2023-10-21 DIAGNOSIS — R1084 Generalized abdominal pain: Secondary | ICD-10-CM | POA: Diagnosis not present

## 2023-10-21 DIAGNOSIS — R112 Nausea with vomiting, unspecified: Secondary | ICD-10-CM | POA: Diagnosis not present

## 2023-10-21 LAB — CBC
HCT: 37.6 % (ref 36.0–46.0)
Hemoglobin: 13.1 g/dL (ref 12.0–15.0)
MCH: 29.9 pg (ref 26.0–34.0)
MCHC: 34.8 g/dL (ref 30.0–36.0)
MCV: 85.8 fL (ref 80.0–100.0)
Platelets: 203 10*3/uL (ref 150–400)
RBC: 4.38 MIL/uL (ref 3.87–5.11)
RDW: 12.6 % (ref 11.5–15.5)
WBC: 7.8 10*3/uL (ref 4.0–10.5)
nRBC: 0 % (ref 0.0–0.2)

## 2023-10-21 LAB — COMPREHENSIVE METABOLIC PANEL
ALT: 40 U/L (ref 0–44)
AST: 32 U/L (ref 15–41)
Albumin: 4.5 g/dL (ref 3.5–5.0)
Alkaline Phosphatase: 50 U/L (ref 38–126)
Anion gap: 19 — ABNORMAL HIGH (ref 5–15)
BUN: 7 mg/dL (ref 6–20)
CO2: 18 mmol/L — ABNORMAL LOW (ref 22–32)
Calcium: 9.5 mg/dL (ref 8.9–10.3)
Chloride: 99 mmol/L (ref 98–111)
Creatinine, Ser: 0.56 mg/dL (ref 0.44–1.00)
GFR, Estimated: >60 mL/min (ref >60)
Glucose, Bld: 90 mg/dL (ref 70–99)
Potassium: 3.1 mmol/L — ABNORMAL LOW (ref 3.5–5.1)
Sodium: 136 mmol/L (ref 135–145)
Total Bilirubin: 1.6 mg/dL — ABNORMAL HIGH (ref 0.0–1.2)
Total Protein: 7.6 g/dL (ref 6.5–8.1)

## 2023-10-21 LAB — LIPASE, BLOOD: Lipase: 37 U/L (ref 11–51)

## 2023-10-21 NOTE — ED Triage Notes (Addendum)
Pt to ed from home via [facility] for abdominal pain. Pt had a procedure yesterday and was DC from her earlier. Pt is caox4, in no acute distress in triage. Pt was DC with meds and didn't have them filled.

## 2023-10-21 NOTE — ED Notes (Signed)
Pt visualized walking out of door and getting into car. Did not inform staff on intent to leave.

## 2023-10-21 NOTE — ED Triage Notes (Addendum)
BIB mother from home for abd pain, and NVD. Multiple ED visits in system for the same. Admitted to [facility]. EGD done 2/12. D/c'd 2/14. Returned to [facility] ED this am, and walked out. Arrives here for continued sx. Alert, NAD, calm, interactive. In last 24 hrs: V x "too many times to count", D x3. Denies fever. Mentions "dx of gastritis, and meds aren't helping". No sure if Rx were filled.

## 2024-01-23 ENCOUNTER — Emergency Department
Admission: EM | Admit: 2024-01-23 | Discharge: 2024-01-23 | Disposition: A | Discharge status: Home / Self Care | Attending: Emergency Medicine | Admitting: Emergency Medicine

## 2024-01-23 ENCOUNTER — Other Ambulatory Visit: Payer: Self-pay

## 2024-01-23 DIAGNOSIS — R110 Nausea: Secondary | ICD-10-CM | POA: Diagnosis not present

## 2024-01-23 DIAGNOSIS — Z3A01 Less than 8 weeks gestation of pregnancy: Secondary | ICD-10-CM | POA: Diagnosis not present

## 2024-01-23 DIAGNOSIS — O219 Vomiting of pregnancy, unspecified: Secondary | ICD-10-CM | POA: Diagnosis not present

## 2024-01-23 DIAGNOSIS — Z3A09 9 weeks gestation of pregnancy: Secondary | ICD-10-CM | POA: Diagnosis not present

## 2024-01-23 DIAGNOSIS — O26891 Other specified pregnancy related conditions, first trimester: Secondary | ICD-10-CM | POA: Diagnosis not present

## 2024-01-23 DIAGNOSIS — O210 Mild hyperemesis gravidarum: Secondary | ICD-10-CM

## 2024-01-23 DIAGNOSIS — R1013 Epigastric pain: Secondary | ICD-10-CM | POA: Insufficient documentation

## 2024-01-23 DIAGNOSIS — R1111 Vomiting without nausea: Secondary | ICD-10-CM | POA: Diagnosis not present

## 2024-01-23 DIAGNOSIS — R1084 Generalized abdominal pain: Secondary | ICD-10-CM | POA: Diagnosis not present

## 2024-01-23 LAB — CBC
HCT: 38.8 % (ref 36.0–46.0)
Hemoglobin: 13.4 g/dL (ref 12.0–15.0)
MCH: 30.6 pg (ref 26.0–34.0)
MCHC: 34.5 g/dL (ref 30.0–36.0)
MCV: 88.6 fL (ref 80.0–100.0)
Platelets: 220 10*3/uL (ref 150–400)
RBC: 4.38 MIL/uL (ref 3.87–5.11)
RDW: 12.3 % (ref 11.5–15.5)
WBC: 11.2 10*3/uL — ABNORMAL HIGH (ref 4.0–10.5)
nRBC: 0 % (ref 0.0–0.2)

## 2024-01-23 LAB — COMPREHENSIVE METABOLIC PANEL WITH GFR
ALT: 29 U/L (ref 0–44)
AST: 27 U/L (ref 15–41)
Albumin: 4.3 g/dL (ref 3.5–5.0)
Alkaline Phosphatase: 44 U/L (ref 38–126)
Anion gap: 13 (ref 5–15)
BUN: 12 mg/dL (ref 6–20)
CO2: 19 mmol/L — ABNORMAL LOW (ref 22–32)
Calcium: 9.4 mg/dL (ref 8.9–10.3)
Chloride: 102 mmol/L (ref 98–111)
Creatinine, Ser: 0.45 mg/dL (ref 0.44–1.00)
GFR, Estimated: >60 mL/min (ref >60)
Glucose, Bld: 115 mg/dL — ABNORMAL HIGH (ref 70–99)
Potassium: 4.2 mmol/L (ref 3.5–5.1)
Sodium: 134 mmol/L — ABNORMAL LOW (ref 135–145)
Total Bilirubin: 0.9 mg/dL (ref 0.0–1.2)
Total Protein: 7.8 g/dL (ref 6.5–8.1)

## 2024-01-23 LAB — HCG, QUANTITATIVE, PREGNANCY: hCG, Beta Chain, Quant, S: 232131 m[IU]/mL — ABNORMAL HIGH (ref <5)

## 2024-01-23 MED ORDER — SODIUM CHLORIDE 0.9 % IV BOLUS
1000.0000 mL | Freq: Once | INTRAVENOUS | Status: AC
  Administered 2024-01-23: 1000 mL via INTRAVENOUS

## 2024-01-23 MED ORDER — ALUM & MAG HYDROXIDE-SIMETH 200-200-20 MG/5ML PO SUSP
15.0000 mL | Freq: Once | ORAL | Status: DC
  Filled 2024-01-23: qty 30

## 2024-01-23 MED ORDER — PROMETHAZINE HCL 25 MG PO TABS: 25.0000 mg | ORAL_TABLET | Freq: Four times a day (QID) | ORAL | 0 refills | Status: DC | PRN

## 2024-01-23 MED ORDER — MORPHINE SULFATE (PF) 4 MG/ML IV SOLN
4.0000 mg | Freq: Once | INTRAVENOUS | Status: AC
  Administered 2024-01-23: 4 mg via INTRAVENOUS
  Filled 2024-01-23: qty 1

## 2024-01-23 MED ORDER — ONDANSETRON HCL 4 MG/2ML IJ SOLN
4.0000 mg | Freq: Once | INTRAMUSCULAR | Status: AC
  Administered 2024-01-23: 4 mg via INTRAVENOUS
  Filled 2024-01-23: qty 2

## 2024-01-23 MED ORDER — METOCLOPRAMIDE HCL 5 MG/ML IJ SOLN
10.0000 mg | Freq: Once | INTRAMUSCULAR | Status: AC
  Administered 2024-01-23: 10 mg via INTRAVENOUS
  Filled 2024-01-23: qty 2

## 2024-01-23 MED ORDER — PROMETHAZINE HCL 12.5 MG RE SUPP: 12.5000 mg | Freq: Four times a day (QID) | RECTAL | 0 refills | Status: DC | PRN

## 2024-01-23 NOTE — ED Notes (Signed)
 Pt requesting more nausea/vomiting medication.

## 2024-01-23 NOTE — ED Triage Notes (Signed)
 Pt arrives via EMS from home due to Nausea and Vomiting for the last four days while being [redacted] weeks pregnant. Pt sts that she has not been able to keep anything down.

## 2024-01-23 NOTE — Discharge Instructions (Addendum)
 Please take your nausea medication as needed, but only as written.  Return to the emergency department for any worsening vomiting unable to tolerate fluids or any other symptom concerning to yourself.

## 2024-01-23 NOTE — ED Provider Notes (Signed)
 [facility] Provider Note    Event Date/Time   First MD Initiated Contact with Patient 01/23/24 0801     (approximate)  History   Chief Complaint: Nausea and vomiting  HPI  Tara Gonzalez is a 28 y.o. female G3, P2 approximately [redacted] weeks pregnant who presents to the emergency department for nausea and vomiting.  According to the patient last for 5 days she has been nauseated with frequent episodes of vomiting.  Patient states with both of her prior pregnancies she has required admission to the hospital for nausea and vomiting.  Patient states she has not been able to keep anything down.  Patient states mild abdominal discomfort in the epigastrium no lower abdominal discomfort no vaginal bleeding or fluid leakage.  Physical Exam   Triage Vital Signs: ED Triage Vitals [01/23/24 0754]  Encounter Vitals Group     BP (!) 122/98     Systolic BP Percentile      Diastolic BP Percentile      Pulse Rate 82     Resp 17     Temp 98.4 F (36.9 C)     Temp Source Oral     SpO2      Weight 135 lb (61.2 kg)     Height 5\' 1"  (1.549 m)     Head Circumference      Peak Flow      Pain Score 8     Pain Loc      Pain Education      Exclude from Growth Chart     Most recent vital signs: Vitals:   01/23/24 0754  BP: (!) 122/98  Pulse: 82  Resp: 17  Temp: 98.4 F (36.9 C)    General: Awake, no distress.  CV:  Good peripheral perfusion.  Regular rate and rhythm  Resp:  Normal effort.  Equal breath sounds bilaterally.  Abd:  No distention.  Soft, slight epigastric tenderness otherwise benign abdomen.  ED Results / Procedures / Treatments   MEDICATIONS ORDERED IN ED: Medications  sodium chloride  0.9 % bolus 1,000 mL (has no administration in time range)  metoCLOPramide  (REGLAN ) injection 10 mg (has no administration in time range)     IMPRESSION / MDM / ASSESSMENT AND PLAN / ED COURSE  I reviewed the triage vital signs and the nursing notes.  Patient's  presentation is most consistent with acute presentation with potential threat to life or bodily function.  Patient presents to the emergency department for nausea and vomiting, history of the same with prior pregnancies.  Patient states slight epigastric discomfort which she relates to the vomiting.  No lower abdominal discomfort no vaginal bleeding or fluid leakage.  We will check labs will IV hydrate, treat with Reglan  and continue to closely monitor.  Patient agreeable to plan.  Patient is requesting pain medication.  I discussed with the patient that we typically avoid narcotics in pregnancy.  Patient understands the risk but still wishes to proceed.    Lab work has resulted overall reassuring with a reassuring CBC reassuring chemistry anion gap at 13.  Patient's beta-hCG of 232,000.  Patient is feeling somewhat better after medications and fluids.  Discussed different medications patient wishes to proceed with Phenergan  to go home with.  Will prescribe both oral as well as suppository Phenergan .  Have the patient hydrate at home and return to the emergency department if needed.  Patient agreeable to plan of care.  FINAL CLINICAL IMPRESSION(S) / ED DIAGNOSES  Hyperemesis gravidarum  Note:  This document was prepared using Dragon voice recognition software and may include unintentional dictation errors.   Ruth Cove, MD 01/23/24 1044

## 2024-01-23 NOTE — ED Notes (Signed)
 Pt given Gatorade for PO challenge

## 2024-01-23 NOTE — ED Notes (Signed)
 Pt requesting pain medication for her abdominal pain. Dr. Azalee Bolds made aware.

## 2024-01-23 NOTE — ED Notes (Signed)
 Pt given saltine crackers to try and eat.

## 2024-01-23 NOTE — ED Notes (Signed)
 Pt given additional warm blankets and is resting comfortably. Pt encouraged to try and give a urine sample.

## 2024-01-23 NOTE — ED Notes (Signed)
 Pt is CAOx4, breathing normally, and normal in color. Pt reports vomiting for 4 days and unable to keep anything down. Pt reports this is normal for her with her pregnancy as it has happened with her other pregnancies.

## 2024-01-24 ENCOUNTER — Observation Stay
Admission: EM | Admit: 2024-01-24 | Discharge: 2024-01-25 | Disposition: A | Discharge status: Home / Self Care | Attending: Obstetrics | Admitting: Obstetrics

## 2024-01-24 ENCOUNTER — Other Ambulatory Visit: Payer: Self-pay

## 2024-01-24 ENCOUNTER — Emergency Department

## 2024-01-24 DIAGNOSIS — Z640 Problems related to unwanted pregnancy: Secondary | ICD-10-CM

## 2024-01-24 DIAGNOSIS — O23591 Infection of other part of genital tract in pregnancy, first trimester: Secondary | ICD-10-CM | POA: Diagnosis not present

## 2024-01-24 DIAGNOSIS — O211 Hyperemesis gravidarum with metabolic disturbance: Secondary | ICD-10-CM | POA: Diagnosis not present

## 2024-01-24 DIAGNOSIS — Z3A09 9 weeks gestation of pregnancy: Secondary | ICD-10-CM

## 2024-01-24 DIAGNOSIS — O26891 Other specified pregnancy related conditions, first trimester: Secondary | ICD-10-CM | POA: Diagnosis not present

## 2024-01-24 DIAGNOSIS — Z833 Family history of diabetes mellitus: Secondary | ICD-10-CM | POA: Diagnosis not present

## 2024-01-24 DIAGNOSIS — O210 Mild hyperemesis gravidarum: Secondary | ICD-10-CM | POA: Diagnosis present

## 2024-01-24 DIAGNOSIS — Z8249 Family history of ischemic heart disease and other diseases of the circulatory system: Secondary | ICD-10-CM

## 2024-01-24 DIAGNOSIS — R112 Nausea with vomiting, unspecified: Secondary | ICD-10-CM | POA: Diagnosis not present

## 2024-01-24 DIAGNOSIS — Z3A01 Less than 8 weeks gestation of pregnancy: Secondary | ICD-10-CM | POA: Diagnosis not present

## 2024-01-24 DIAGNOSIS — R000 Tachycardia, unspecified: Secondary | ICD-10-CM | POA: Diagnosis not present

## 2024-01-24 DIAGNOSIS — O219 Vomiting of pregnancy, unspecified: Principal | ICD-10-CM

## 2024-01-24 LAB — COMPREHENSIVE METABOLIC PANEL WITH GFR
ALT: 36 U/L (ref 0–44)
AST: 35 U/L (ref 15–41)
Albumin: 3.8 g/dL (ref 3.5–5.0)
Alkaline Phosphatase: 40 U/L (ref 38–126)
Anion gap: 8 (ref 5–15)
BUN: 7 mg/dL (ref 6–20)
CO2: 22 mmol/L (ref 22–32)
Calcium: 8.7 mg/dL — ABNORMAL LOW (ref 8.9–10.3)
Chloride: 106 mmol/L (ref 98–111)
Creatinine, Ser: 0.52 mg/dL (ref 0.44–1.00)
GFR, Estimated: >60 mL/min (ref >60)
Glucose, Bld: 105 mg/dL — ABNORMAL HIGH (ref 70–99)
Potassium: 3.2 mmol/L — ABNORMAL LOW (ref 3.5–5.1)
Sodium: 136 mmol/L (ref 135–145)
Total Bilirubin: 0.4 mg/dL (ref 0.0–1.2)
Total Protein: 6.8 g/dL (ref 6.5–8.1)

## 2024-01-24 LAB — CBC WITH DIFFERENTIAL/PLATELET
Abs Immature Granulocytes: 0.07 10*3/uL (ref 0.00–0.07)
Basophils Absolute: 0 10*3/uL (ref 0.0–0.1)
Basophils Relative: 0 %
Eosinophils Absolute: 0 10*3/uL (ref 0.0–0.5)
Eosinophils Relative: 0 %
HCT: 35.9 % — ABNORMAL LOW (ref 36.0–46.0)
Hemoglobin: 12 g/dL (ref 12.0–15.0)
Immature Granulocytes: 1 %
Lymphocytes Relative: 11 %
Lymphs Abs: 1.1 10*3/uL (ref 0.7–4.0)
MCH: 30.2 pg (ref 26.0–34.0)
MCHC: 33.4 g/dL (ref 30.0–36.0)
MCV: 90.4 fL (ref 80.0–100.0)
Monocytes Absolute: 0.5 10*3/uL (ref 0.1–1.0)
Monocytes Relative: 4 %
Neutro Abs: 9.1 10*3/uL — ABNORMAL HIGH (ref 1.7–7.7)
Neutrophils Relative %: 84 %
Platelets: 198 10*3/uL (ref 150–400)
RBC: 3.97 MIL/uL (ref 3.87–5.11)
RDW: 12.3 % (ref 11.5–15.5)
WBC: 10.8 10*3/uL — ABNORMAL HIGH (ref 4.0–10.5)
nRBC: 0 % (ref 0.0–0.2)

## 2024-01-24 LAB — URINALYSIS, W/ REFLEX TO CULTURE (INFECTION SUSPECTED)
Bilirubin Urine: NEGATIVE
Glucose, UA: NEGATIVE mg/dL
Hgb urine dipstick: NEGATIVE
Ketones, ur: 5 mg/dL — AB
Leukocytes,Ua: NEGATIVE
Nitrite: NEGATIVE
Protein, ur: NEGATIVE mg/dL
Specific Gravity, Urine: 1.012 (ref 1.005–1.030)
pH: 8 (ref 5.0–8.0)

## 2024-01-24 LAB — WET PREP, GENITAL
Sperm: NONE SEEN
Trich, Wet Prep: NONE SEEN
WBC, Wet Prep HPF POC: <10 (ref <10)
Yeast Wet Prep HPF POC: NONE SEEN

## 2024-01-24 LAB — MAGNESIUM: Magnesium: 1.9 mg/dL (ref 1.7–2.4)

## 2024-01-24 MED ORDER — ALUM & MAG HYDROXIDE-SIMETH 200-200-20 MG/5ML PO SUSP
30.0000 mL | Freq: Once | ORAL | Status: DC
  Filled 2024-01-24: qty 30

## 2024-01-24 MED ORDER — ONDANSETRON HCL 40 MG/20ML IJ SOLN: 8.0000 mg | Freq: Three times a day (TID) | INTRAMUSCULAR | Status: DC | PRN

## 2024-01-24 MED ORDER — ZOLPIDEM TARTRATE 5 MG PO TABS
5.0000 mg | ORAL_TABLET | Freq: Every evening | ORAL | Status: DC | PRN
  Administered 2024-01-24: 5 mg via ORAL
  Filled 2024-01-24: qty 1

## 2024-01-24 MED ORDER — METRONIDAZOLE 500 MG PO TABS
500.0000 mg | ORAL_TABLET | Freq: Two times a day (BID) | ORAL | Status: DC
  Filled 2024-01-24: qty 1

## 2024-01-24 MED ORDER — ACETAMINOPHEN 10 MG/ML IV SOLN
1000.0000 mg | Freq: Four times a day (QID) | INTRAVENOUS | Status: AC | PRN
  Administered 2024-01-24 (×2): 1000 mg via INTRAVENOUS
  Filled 2024-01-24 (×3): qty 100

## 2024-01-24 MED ORDER — LACTATED RINGERS IV SOLN: INTRAVENOUS | Status: DC

## 2024-01-24 MED ORDER — POTASSIUM CHLORIDE 10 MEQ/100ML IV SOLN
10.0000 meq | INTRAVENOUS | Status: AC
  Administered 2024-01-24: 10 meq via INTRAVENOUS
  Filled 2024-01-24: qty 100

## 2024-01-24 MED ORDER — VITAMIN B-1 100 MG PO TABS
100.0000 mg | ORAL_TABLET | Freq: Every day | ORAL | Status: DC
  Filled 2024-01-24 (×2): qty 1

## 2024-01-24 MED ORDER — METRONIDAZOLE 0.75 % EX GEL
Freq: Every day | CUTANEOUS | Status: DC
  Filled 2024-01-24 (×2): qty 45

## 2024-01-24 MED ORDER — METOCLOPRAMIDE HCL 5 MG/ML IJ SOLN
10.0000 mg | Freq: Once | INTRAMUSCULAR | Status: AC
Start: 2024-01-24 — End: 2024-01-24
  Administered 2024-01-24: 10 mg via INTRAVENOUS
  Filled 2024-01-24: qty 2

## 2024-01-24 MED ORDER — PROMETHAZINE HCL 25 MG RE SUPP: 12.5000 mg | RECTAL | Status: DC | PRN

## 2024-01-24 MED ORDER — METRONIDAZOLE 0.75 % VA GEL
1.0000 | Freq: Every day | VAGINAL | Status: DC
  Filled 2024-01-24: qty 70

## 2024-01-24 MED ORDER — SODIUM CHLORIDE 0.9 % IV BOLUS
1000.0000 mL | Freq: Once | INTRAVENOUS | Status: AC
  Administered 2024-01-24: 1000 mL via INTRAVENOUS

## 2024-01-24 MED ORDER — LIDOCAINE VISCOUS HCL 2 % MT SOLN
15.0000 mL | Freq: Once | OROMUCOSAL | Status: DC
  Filled 2024-01-24: qty 15

## 2024-01-24 MED ORDER — METOCLOPRAMIDE HCL 10 MG PO TABS
10.0000 mg | ORAL_TABLET | Freq: Four times a day (QID) | ORAL | Status: DC
  Filled 2024-01-24: qty 1

## 2024-01-24 MED ORDER — METOCLOPRAMIDE HCL 5 MG/ML IJ SOLN
10.0000 mg | Freq: Four times a day (QID) | INTRAMUSCULAR | Status: DC
  Administered 2024-01-24 – 2024-01-25 (×5): 10 mg via INTRAVENOUS
  Filled 2024-01-24 (×5): qty 2

## 2024-01-24 MED ORDER — THIAMINE HCL 100 MG/ML IJ SOLN
100.0000 mg | Freq: Every day | INTRAMUSCULAR | Status: DC
  Administered 2024-01-24 – 2024-01-25 (×2): 100 mg via INTRAVENOUS
  Filled 2024-01-24 (×2): qty 1

## 2024-01-24 MED ORDER — ACETAMINOPHEN 325 MG PO TABS: 650.0000 mg | ORAL_TABLET | ORAL | Status: DC | PRN

## 2024-01-24 MED ORDER — ONDANSETRON HCL 4 MG/2ML IJ SOLN
4.0000 mg | Freq: Once | INTRAMUSCULAR | Status: AC
  Administered 2024-01-24: 4 mg via INTRAVENOUS

## 2024-01-24 MED ORDER — THIAMINE HCL 100 MG/ML IJ SOLN
Freq: Once | INTRAVENOUS | Status: AC
  Filled 2024-01-24: qty 1000

## 2024-01-24 MED ORDER — ONDANSETRON HCL 4 MG/2ML IJ SOLN
4.0000 mg | Freq: Once | INTRAMUSCULAR | Status: AC
  Administered 2024-01-24: 4 mg via INTRAVENOUS
  Filled 2024-01-24: qty 2

## 2024-01-24 MED ORDER — SODIUM CHLORIDE 0.9 % IV SOLN
12.5000 mg | Freq: Four times a day (QID) | INTRAVENOUS | Status: DC | PRN
  Administered 2024-01-24 – 2024-01-25 (×3): 12.5 mg via INTRAVENOUS
  Filled 2024-01-24: qty 0.5
  Filled 2024-01-24 (×2): qty 12.5

## 2024-01-24 MED ORDER — POTASSIUM CHLORIDE 20 MEQ PO PACK: 40.0000 meq | PACK | Freq: Two times a day (BID) | ORAL | Status: DC

## 2024-01-24 MED ORDER — PROMETHAZINE HCL 25 MG PO TABS: 12.5000 mg | ORAL_TABLET | ORAL | Status: DC | PRN

## 2024-01-24 MED ORDER — ACETAMINOPHEN 500 MG PO TABS
1000.0000 mg | ORAL_TABLET | Freq: Once | ORAL | Status: AC
Start: 2024-01-24 — End: 2024-01-24
  Administered 2024-01-24: 1000 mg via ORAL
  Filled 2024-01-24: qty 2

## 2024-01-24 MED ORDER — FOLIC ACID 1 MG PO TABS
1.0000 mg | ORAL_TABLET | Freq: Every day | ORAL | Status: DC
  Filled 2024-01-24 (×2): qty 1

## 2024-01-24 MED ORDER — FAMOTIDINE 20 MG PO TABS: 20.0000 mg | ORAL_TABLET | Freq: Two times a day (BID) | ORAL | Status: DC

## 2024-01-24 MED ORDER — FAMOTIDINE 20 MG PO TABS: 20.0000 mg | ORAL_TABLET | Freq: Once | ORAL | Status: DC

## 2024-01-24 MED ORDER — FAMOTIDINE IN NACL 20-0.9 MG/50ML-% IV SOLN
20.0000 mg | Freq: Two times a day (BID) | INTRAVENOUS | Status: DC
  Administered 2024-01-24 – 2024-01-25 (×3): 20 mg via INTRAVENOUS
  Filled 2024-01-24 (×4): qty 50

## 2024-01-24 MED ORDER — SODIUM CHLORIDE 0.9 % IV SOLN: INTRAVENOUS | Status: AC | PRN

## 2024-01-24 MED ORDER — HYDROXYZINE HCL 50 MG/ML IM SOLN
50.0000 mg | Freq: Four times a day (QID) | INTRAMUSCULAR | Status: DC | PRN
  Administered 2024-01-25: 50 mg via INTRAMUSCULAR
  Filled 2024-01-24 (×2): qty 1

## 2024-01-24 MED ORDER — ONDANSETRON HCL 4 MG/2ML IJ SOLN
4.0000 mg | Freq: Three times a day (TID) | INTRAMUSCULAR | Status: DC | PRN
  Administered 2024-01-24 – 2024-01-25 (×4): 4 mg via INTRAVENOUS
  Filled 2024-01-24 (×5): qty 2

## 2024-01-24 MED ORDER — FOLIC ACID 5 MG/ML IJ SOLN
1.0000 mg | Freq: Every day | INTRAMUSCULAR | Status: DC
  Administered 2024-01-24 – 2024-01-25 (×2): 1 mg via INTRAVENOUS
  Filled 2024-01-24 (×2): qty 0.2

## 2024-01-24 MED ORDER — POTASSIUM CHLORIDE 2 MEQ/ML IV SOLN
INTRAVENOUS | Status: DC
  Filled 2024-01-24 (×7): qty 1000

## 2024-01-24 MED ORDER — ONDANSETRON 4 MG PO TBDP: 4.0000 mg | ORAL_TABLET | Freq: Three times a day (TID) | ORAL | Status: DC | PRN

## 2024-01-24 MED ORDER — MORPHINE SULFATE (PF) 4 MG/ML IV SOLN
4.0000 mg | Freq: Once | INTRAVENOUS | Status: AC
  Administered 2024-01-24: 4 mg via INTRAVENOUS
  Filled 2024-01-24: qty 1

## 2024-01-24 MED ORDER — HYDROXYZINE HCL 25 MG PO TABS: 50.0000 mg | ORAL_TABLET | Freq: Four times a day (QID) | ORAL | Status: DC | PRN

## 2024-01-24 MED ORDER — CALCIUM CARBONATE ANTACID 500 MG PO CHEW: 2.0000 | CHEWABLE_TABLET | ORAL | Status: DC | PRN

## 2024-01-24 NOTE — ED Notes (Signed)
 Patient refused Maalox and Lidocaine  because she said she would vomit it back up.  She requested IV pain medication and stated that she knows we don't like to give pain medication to pregnant women, but she is not going through with this pregnancy because of the intensity of her nausea and vomiting.  Dr. Margery Sheets has been informed.

## 2024-01-24 NOTE — ED Notes (Signed)
 Pt called out complaining of ongoing emesis, requesting more nausea medication. MD notified.

## 2024-01-24 NOTE — ED Provider Notes (Signed)
 [facility] Provider Note    Event Date/Time   First MD Initiated Contact with Patient 01/24/24 [phone]     (approximate)   History   Emesis During Pregnancy   HPI  Tara Gonzalez is a 28 y.o. female   Past medical history of bipolar, anxiety and depression, intractable vomiting, status postcholecystectomy, who presents to the emergency department approximately [redacted] weeks pregnant by last menstrual period with nausea and vomiting, abdominal discomfort diffusely but mostly in the epigastrium.  She was seen in the emergency department and discharged yesterday and has been taking her prescribed Phenergan  but despite this has had ongoing intractable vomiting.  She has not plan on keeping this pregnancy, and has an appointment upcoming for an abortion.  She denies vaginal bleeding or discharge.  No urinary symptoms.  External Medical Documents Reviewed: Workup in the emergency department yesterday with normal lab testing, discharged on Phenergan       Physical Exam   Triage Vital Signs: ED Triage Vitals  Encounter Vitals Group     BP 01/24/24 0535 128/82     Systolic BP Percentile --      Diastolic BP Percentile --      Pulse Rate 01/24/24 0535 93     Resp 01/24/24 0535 10     Temp 01/24/24 0535 98.4 F (36.9 C)     Temp Source 01/24/24 0535 Oral     SpO2 01/24/24 0535 100 %     Weight 01/24/24 0533 134 lb 7.7 oz (61 kg)     Height 01/24/24 0533 5\' 1"  (1.549 m)     Head Circumference --      Peak Flow --      Pain Score 01/24/24 0533 8     Pain Loc --      Pain Education --      Exclude from Growth Chart --     Most recent vital signs: Vitals:   01/24/24 0535 01/24/24 0700  BP: 128/82   Pulse: 93 77  Resp: 10 16  Temp: 98.4 F (36.9 C)   SpO2: 100% 99%    General: Awake, no distress.  CV:  Good peripheral perfusion.  Resp:  Normal effort.  Abd:  No distention.  Other:  She is a benign abdomen and that there is no rigidity or guarding and  no focal tenderness but she does claim that everywhere is hurting as I palpate.  It is a crampy abdominal pain.  Her skin appears warm well-perfused hemodynamics are appropriate and reassuring she has no fever.   ED Results / Procedures / Treatments   Labs (all labs ordered are listed, but only abnormal results are displayed) Labs Reviewed  COMPREHENSIVE METABOLIC PANEL WITH GFR - Abnormal; Notable for the following components:      Result Value   Potassium 3.2 (*)    Glucose, Bld 105 (*)    Calcium  8.7 (*)    All other components within normal limits  CBC WITH DIFFERENTIAL/PLATELET - Abnormal; Notable for the following components:   WBC 10.8 (*)    HCT 35.9 (*)    Neutro Abs 9.1 (*)    All other components within normal limits  MAGNESIUM   URINALYSIS, W/ REFLEX TO CULTURE (INFECTION SUSPECTED)     I ordered and reviewed the above labs they are notable for potassium slightly low at 3.2 and her white blood cell count is ever so slightly elevated 10.8.    PROCEDURES:  Critical Care performed: No  Procedures  MEDICATIONS ORDERED IN ED: Medications  alum & mag hydroxide-simeth (MAALOX/MYLANTA) 200-200-20 MG/5ML suspension 30 mL (30 mLs Oral Patient Refused/Not Given 01/24/24 0981)    And  lidocaine  (XYLOCAINE ) 2 % viscous mouth solution 15 mL (15 mLs Oral Patient Refused/Not Given 01/24/24 1914)  ondansetron  (ZOFRAN ) injection 4 mg (4 mg Intravenous Given 01/24/24 0630)  sodium chloride  0.9 % bolus 1,000 mL (1,000 mLs Intravenous New Bag/Given 01/24/24 0629)  morphine  (PF) 4 MG/ML injection 4 mg (4 mg Intravenous Given 01/24/24 0643)     IMPRESSION / MDM / ASSESSMENT AND PLAN / ED COURSE  I reviewed the triage vital signs and the nursing notes.                                Patient's presentation is most consistent with acute presentation with potential threat to life or bodily function.  Differential diagnosis includes, but is not limited to, hyperemesis gravidarum,  dehydration electrolyte derangements, ectopic pregnancy, molar pregnancy   The patient is on the cardiac monitor to evaluate for evidence of arrhythmia and/or significant heart rate changes.  MDM:    This is a patient with intractable nausea and vomiting in early pregnancy with no confirmed IUP quite yet.  She has no intention of keeping this pregnancy and has an appointment upcoming for an abortion.  She has crampy abdominal pain diffusely but mostly centered in the epigastrium.  She had a set of labs including lipase LFTs yesterday that were completely normal.  Her beta quantitative hCG was over 200,000 obtained yesterday.  She has no discharge or urinary symptoms and no vaginal bleeding.  Given her poor response to outpatient treatment, I anticipate that unless she gets really good relief from her antiemetics and fluids today, she may need to be admitted for intractable vomiting.  In the meantime we will get some labs, fluid resuscitation, pain control with IV morphine  which she desires even after discussion of potential risks during pregnancy, and a transvaginal ultrasound to rule out ectopic pregnancy or other pregnancy related complications structurally.  She is in stable condition as he was signed out to the 7 AM provider for evaluation as above and reassessment for disposition.      FINAL CLINICAL IMPRESSION(S) / ED DIAGNOSES   Final diagnoses:  Vomiting affecting pregnancy     Rx / DC Orders   ED Discharge Orders     None        Note:  This document was prepared using Dragon voice recognition software and may include unintentional dictation errors.    Buell Carmin, MD 01/24/24 [phone]

## 2024-01-24 NOTE — Plan of Care (Signed)
 Patient has vomited X2 this shift, NPO, voiding well, Kpad ordered for pain, IV infusing, nausea meds given, patient resting. Erlene Hawks, RN 01/24/24 @1720 

## 2024-01-24 NOTE — ED Provider Notes (Signed)
  Physical Exam  BP 128/82 (BP Location: Left Arm)   Pulse 77   Temp 98.4 F (36.9 C) (Oral)   Resp 16   Ht 5\' 1"  (1.549 m)   Wt 61 kg   LMP 11/29/2023 (Exact Date)   SpO2 99%   BMI 25.41 kg/m   Physical Exam  Procedures  Procedures  ED Course / MDM   Clinical Course as of 01/24/24 0832  Wed Jan 24, 2024  0726 S/o from Dr. Margery Sheets - 70F undesired pregnancy, planning for AB - hyperemesis, rpt visits - diffuse ab pain, rpt labs stable - hx prior cholecystectomy  TVUS pending  Consider admission if still symptomatic [MM]  0803 Ultrasound images reviewed, does confirm IUP, fetal heart rate measured at 158  Formal read pending [MM]  0811 US : IMPRESSION: *Single live intrauterine gestation with crown-rump length corresponding to 9 weeks 0 day.   [MM]  [phone] Patient reevaluated, still with persistent discomfort which she describes as in the epigastrium radiating up through lower sternum.  Abdominal exam with mild tenderness in upper abdomen.  Has ongoing nausea.  Discussed possible admission given failed outpatient management and ongoing symptoms--patient agrees with plan. [MM]  0830 D/w Lavanda Porter, CNM -- agrees w/ admission [MM]    Clinical Course User Index [MM] Collis Deaner, MD   Medical Decision Making Amount and/or Complexity of Data Reviewed Labs: ordered. Radiology: ordered.  Risk OTC drugs. Prescription drug management. Decision regarding hospitalization.          Collis Deaner, MD 01/24/24 [phone]

## 2024-01-24 NOTE — ED Notes (Signed)
Informed RN bed assigned 

## 2024-01-24 NOTE — H&P (Addendum)
 HISTORY AND PHYSICAL NOTE  History of Present Illness: Tara Gonzalez is a 28 y.o. G3P2002 at [redacted]w[redacted]d by today's US  admitted for intractable nausea and vomiting in pregnancy.  She presented to the ED with complaints of nausea and vomiting and abdominal pain.  She has been unable to successfully keep food or liquids down for several days.  She has tried to help with N/V with a Phenergan  prescription but has not been effective.  She denies cramping, vaginal bleeding, or LOF.  Has not yet been able to feel fetal movement yet.    This is an unwanted pregnancy and she has plans for termination in [place], [place].She was scheduled for yesterday but was too sick to go to that appointment.  Factors complicating pregnancy:  Unwanted pregnancy Hyperemesis gravidarum Anxiety and depression Hypokalemia Bipolar pregnancy  Prenatal care site:  [facility]  Patient Active Problem List   Diagnosis Date Noted   Hyperemesis gravidarum 01/24/2024   GI bleeding 10/18/2023   Anxiety and depression 10/18/2023   Influenza A 10/18/2023   Acute gastroenteritis 10/17/2023   Exposure to STD 09/30/2020   Acute cholecystitis 06/25/2020   Intractable vomiting 05/20/2017   Grief reaction 09/11/2015   Hypokalemia 08/01/2015   Lower abdominal pain 01/21/2013   Bipolar disorder (HCC) 05/02/2012    Past Medical History:  Diagnosis Date   Anemia    during last pregnancy diagnosed    Anxiety    Bipolar disorder (HCC)    Bipolar disorder (HCC)    Depression    on going started diagnosed as child. not currently on medication    Headache    Pyelonephritis affecting pregnancy in first trimester     Past Surgical History:  Procedure Laterality Date   BIOPSY  10/18/2023   Procedure: BIOPSY;  Surgeon: Marnee Sink, MD;  Location: [facility] ENDOSCOPY;  Service: Endoscopy;;   CHOLECYSTECTOMY N/A 06/25/2020   Procedure: LAPAROSCOPIC CHOLECYSTECTOMY;  Surgeon: Derral Flick, MD;  Location: [facility] ORS;  Service:  General;  Laterality: N/A;   ESOPHAGOGASTRODUODENOSCOPY N/A 10/18/2023   Procedure: ESOPHAGOGASTRODUODENOSCOPY (EGD);  Surgeon: Marnee Sink, MD;  Location[facility] ENDOSCOPY;  Service: Endoscopy;  Laterality: N/A;   TONSILLECTOMY      OB History  Gravida Para Term Preterm AB Living  3 2 2   2   SAB IAB Ectopic Multiple Live Births     0 2    # Outcome Date GA Lbr Len/2nd Weight Sex Type Anes PTL Lv  3 Current           2 Term 12/08/17 [redacted]w[redacted]d 02:07 / 00:16 3521 g M Vag-Spont EPI  LIV  1 Term 01/12/16 [redacted]w[redacted]d 05:57 / 00:08 2665 g M Vag-Vacuum EPI  LIV    Social History:  reports that she has never smoked. She has never used smokeless tobacco. She reports current alcohol use. She reports current drug use. Drug: Marijuana.  Family History: family history includes Asthma in her brother; Autism spectrum disorder in her brother; Congenital Murmur in her maternal grandmother; Diabetes in her maternal grandfather and maternal grandmother; Hyperlipidemia in her mother; Hypertension in her mother.  No Known Allergies  Medications Prior to Admission  Medication Sig Dispense Refill Last Dose/Taking   ondansetron  (ZOFRAN -ODT) 4 MG disintegrating tablet Take 1 tablet (4 mg total) by mouth every 8 (eight) hours as needed for nausea or vomiting. 30 tablet 0 01/24/2024 Morning   promethazine  (PHENERGAN ) 25 MG tablet Take 1 tablet (25 mg total) by mouth every 6 (six) hours as needed  for nausea or vomiting. 20 tablet 0 01/23/2024   pantoprazole  (PROTONIX ) 40 MG tablet Take 1 tablet (40 mg total) by mouth daily. (Patient not taking: Reported on 01/24/2024) 30 tablet 1 Not Taking   polyethylene glycol (MIRALAX  / GLYCOLAX ) 17 g packet Take 17 g by mouth daily as needed for mild constipation. (Patient not taking: Reported on 10/18/2023) 14 each 0    promethazine  (PHENERGAN ) 12.5 MG suppository Place 1 suppository (12.5 mg total) rectally every 6 (six) hours as needed for nausea or vomiting. (Patient not taking: Reported  on 01/24/2024) 12 each 0 Not Taking    ROS  Physical Examination: Vitals:  BP 133/87 (BP Location: Left Arm)   Pulse 74   Temp 99 F (37.2 C) (Oral)   Resp 18   Ht 5\' 1"  (1.549 m)   Wt 61 kg   LMP 11/29/2023 (Exact Date)   SpO2 97%   BMI 25.41 kg/m  General: no acute distress.  HEENT: normocephalic, atraumatic Heart: regular rate & rhythm.  No murmurs/rubs/gallops Lungs: clear to auscultation bilaterally, normal respiratory effort Abdomen: soft, gravid, c/w with [redacted] week gestation, non-tender  Pelvic: deferred  Extremities: non-tender, symmetric, mild edema bilaterally.  DTRs: +2  Neurologic: Alert & oriented x 3.    Labs:  Results for orders placed or performed during the hospital encounter of 01/24/24 (from the past 24 hours)  Comprehensive metabolic panel   Collection Time: 01/24/24  6:28 AM  Result Value Ref Range   Sodium 136 135 - 145 mmol/L   Potassium 3.2 (L) 3.5 - 5.1 mmol/L   Chloride 106 98 - 111 mmol/L   CO2 22 22 - 32 mmol/L   Glucose, Bld 105 (H) 70 - 99 mg/dL   BUN 7 6 - 20 mg/dL   Creatinine, Ser 7.82 0.44 - 1.00 mg/dL   Calcium  8.7 (L) 8.9 - 10.3 mg/dL   Total Protein 6.8 6.5 - 8.1 g/dL   Albumin 3.8 3.5 - 5.0 g/dL   AST 35 15 - 41 U/L   ALT 36 0 - 44 U/L   Alkaline Phosphatase 40 38 - 126 U/L   Total Bilirubin 0.4 0.0 - 1.2 mg/dL   GFR, Estimated >95 >62 mL/min   Anion gap 8 5 - 15  CBC with Differential   Collection Time: 01/24/24  6:28 AM  Result Value Ref Range   WBC 10.8 (H) 4.0 - 10.5 K/uL   RBC 3.97 3.87 - 5.11 MIL/uL   Hemoglobin 12.0 12.0 - 15.0 g/dL   HCT 13.0 (L) 86.5 - 78.4 %   MCV 90.4 80.0 - 100.0 fL   MCH 30.2 26.0 - 34.0 pg   MCHC 33.4 30.0 - 36.0 g/dL   RDW 69.6 29.5 - 28.4 %   Platelets 198 150 - 400 K/uL   nRBC 0.0 0.0 - 0.2 %   Neutrophils Relative % 84 %   Neutro Abs 9.1 (H) 1.7 - 7.7 K/uL   Lymphocytes Relative 11 %   Lymphs Abs 1.1 0.7 - 4.0 K/uL   Monocytes Relative 4 %   Monocytes Absolute 0.5 0.1 - 1.0 K/uL    Eosinophils Relative 0 %   Eosinophils Absolute 0.0 0.0 - 0.5 K/uL   Basophils Relative 0 %   Basophils Absolute 0.0 0.0 - 0.1 K/uL   Immature Granulocytes 1 %   Abs Immature Granulocytes 0.07 0.00 - 0.07 K/uL  Magnesium    Collection Time: 01/24/24  6:28 AM  Result Value Ref Range   Magnesium  1.9  1.7 - 2.4 mg/dL  Urinalysis, w/ Reflex to Culture (Infection Suspected) -Urine, Clean Catch   Collection Time: 01/24/24 11:02 AM  Result Value Ref Range   Specimen Source URINE, CLEAN CATCH    Color, Urine YELLOW (A) YELLOW   APPearance CLOUDY (A) CLEAR   Specific Gravity, Urine 1.012 1.005 - 1.030   pH 8.0 5.0 - 8.0   Glucose, UA NEGATIVE NEGATIVE mg/dL   Hgb urine dipstick NEGATIVE NEGATIVE   Bilirubin Urine NEGATIVE NEGATIVE   Ketones, ur 5 (A) NEGATIVE mg/dL   Protein, ur NEGATIVE NEGATIVE mg/dL   Nitrite NEGATIVE NEGATIVE   Leukocytes,Ua NEGATIVE NEGATIVE   RBC / HPF 0-5 0 - 5 RBC/hpf   WBC, UA 0-5 0 - 5 WBC/hpf   Bacteria, UA RARE (A) NONE SEEN   Squamous Epithelial / HPF 6-10 0 - 5 /HPF   Mucus PRESENT    Budding Yeast PRESENT    Amorphous Crystal PRESENT   Wet prep, genital   Collection Time: 01/24/24 11:35 AM   Specimen: Vaginal  Result Value Ref Range   Yeast Wet Prep HPF POC NONE SEEN NONE SEEN   Trich, Wet Prep NONE SEEN NONE SEEN   Clue Cells Wet Prep HPF POC PRESENT (A) NONE SEEN   WBC, Wet Prep HPF POC <10 <10   Sperm NONE SEEN     Imaging Studies: US  OB Comp Less 14 Wks Result Date: 01/24/2024 CLINICAL DATA:  [id number] Pelvic pain [id number] EXAM: OBSTETRIC <14 WK ULTRASOUND TECHNIQUE: Transabdominal ultrasound was performed for evaluation of the gestation as well as the maternal uterus and adnexal regions. COMPARISON:  None Available. FINDINGS: Intrauterine gestational sac: Single Yolk sac:  Visualized. Embryo:  Visualized. Cardiac Activity: Visualized. Heart Rate: 158 bpm CRL: 23.3  mm   9 w 0 d                  US  EDC: 08/28/2024. Subchorionic hemorrhage:  None  visualized. Maternal uterus/adnexae: Within normal limits. IMPRESSION: *Single live intrauterine gestation with crown-rump length corresponding to 9 weeks 0 day. Electronically Signed   By: Beula Brunswick M.D.   On: 01/24/2024 08:05    Assessment and Plan: Patient Active Problem List   Diagnosis Date Noted   Hyperemesis gravidarum 01/24/2024   GI bleeding 10/18/2023   Anxiety and depression 10/18/2023   Influenza A 10/18/2023   Acute gastroenteritis 10/17/2023   Exposure to STD 09/30/2020   Acute cholecystitis 06/25/2020   Intractable vomiting 05/20/2017   Grief reaction 09/11/2015   Hypokalemia 08/01/2015   Lower abdominal pain 01/21/2013   Bipolar disorder (HCC) 05/02/2012    1. Admit to Antenatal - hyperemesis protocol initiated  - NPO - advance diet as tolerated  - Maintain IV fluids and IV electrolyte replacement  - Scheduled IV/PO antiemetics ordered with PRN IV/PO/PR antiemetics for breakthrough N/V  - Daily weight  - Q4 hour intake/output   2. Routine antenatal care  - Activity as tolerated  - Bathroom privileges  - OB US  completed  3. Hypokalemia - K+ 3.2, ordered K+ 10meq IV x2 but patient refused and refused the oral supplementation. 20meq K+ added to LR for maintenance fluids.  - Recheck CMP in the morning  4. BV - treat with Metrogel  vaginal applicator x 5 days  5. Unwanted pregnancy - she is aware of [place] abortion laws and is making an appointment for termination  Auston Left, CNM  Certified Nurse Midwife [facility]

## 2024-01-24 NOTE — Plan of Care (Signed)
  Problem: Education: Goal: Knowledge of disease or condition will improve Outcome: Progressing Goal: Knowledge of the prescribed therapeutic regimen will improve Outcome: Progressing Goal: Individualized Educational Video(s) Outcome: Progressing   Problem: Clinical Measurements: Goal: Complications related to the disease process, condition or treatment will be avoided or minimized Outcome: Progressing   Problem: Education: Goal: Knowledge of disease or condition will improve Outcome: Progressing Goal: Knowledge of the prescribed therapeutic regimen will improve Outcome: Progressing   Problem: Bowel/Gastric: Goal: Occurences of nausea and/or vomiting will decrease Outcome: Progressing   Problem: Fluid Volume: Goal: Maintenance of adequate hydration will improve Outcome: Progressing   Problem: Nutritional: Goal: Achievement of adequate weight for body size and type will improve Outcome: Progressing   Problem: Education: Goal: Knowledge of General Education information will improve Description: Including pain rating scale, medication(s)/side effects and non-pharmacologic comfort measures Outcome: Progressing   Problem: Health Behavior/Discharge Planning: Goal: Ability to manage health-related needs will improve Outcome: Progressing   Problem: Clinical Measurements: Goal: Ability to maintain clinical measurements within normal limits will improve Outcome: Progressing Goal: Will remain free from infection Outcome: Progressing Goal: Diagnostic test results will improve Outcome: Progressing Goal: Respiratory complications will improve Outcome: Progressing Goal: Cardiovascular complication will be avoided Outcome: Progressing   Problem: Activity: Goal: Risk for activity intolerance will decrease Outcome: Progressing   Problem: Nutrition: Goal: Adequate nutrition will be maintained Outcome: Progressing   Problem: Coping: Goal: Level of anxiety will  decrease Outcome: Progressing   Problem: Elimination: Goal: Will not experience complications related to bowel motility Outcome: Progressing Goal: Will not experience complications related to urinary retention Outcome: Progressing   Problem: Pain Managment: Goal: General experience of comfort will improve and/or be controlled Outcome: Progressing   Problem: Safety: Goal: Ability to remain free from injury will improve Outcome: Progressing   Problem: Skin Integrity: Goal: Risk for impaired skin integrity will decrease Outcome: Progressing

## 2024-01-24 NOTE — ED Triage Notes (Addendum)
 Patient comes from home by [facility].  She says she is pregnant and has been having issues with vomiting for several days now.  Patient was seen here yesterday and prescribed phenergan , but has had no relief.  She has come back for further evaluation.  She also says she has a gastric ulcer which she thinks may be inflamed.   Patient has received a liter of normal saline from EMS

## 2024-01-25 DIAGNOSIS — Z3A01 Less than 8 weeks gestation of pregnancy: Secondary | ICD-10-CM | POA: Diagnosis not present

## 2024-01-25 DIAGNOSIS — O219 Vomiting of pregnancy, unspecified: Secondary | ICD-10-CM | POA: Diagnosis not present

## 2024-01-25 DIAGNOSIS — Z3A09 9 weeks gestation of pregnancy: Secondary | ICD-10-CM | POA: Diagnosis not present

## 2024-01-25 DIAGNOSIS — O210 Mild hyperemesis gravidarum: Secondary | ICD-10-CM | POA: Diagnosis present

## 2024-01-25 DIAGNOSIS — Z833 Family history of diabetes mellitus: Secondary | ICD-10-CM | POA: Diagnosis not present

## 2024-01-25 DIAGNOSIS — O211 Hyperemesis gravidarum with metabolic disturbance: Secondary | ICD-10-CM | POA: Diagnosis not present

## 2024-01-25 DIAGNOSIS — Z8249 Family history of ischemic heart disease and other diseases of the circulatory system: Secondary | ICD-10-CM | POA: Diagnosis not present

## 2024-01-25 DIAGNOSIS — O23591 Infection of other part of genital tract in pregnancy, first trimester: Secondary | ICD-10-CM | POA: Diagnosis not present

## 2024-01-25 DIAGNOSIS — Z640 Problems related to unwanted pregnancy: Secondary | ICD-10-CM | POA: Diagnosis not present

## 2024-01-25 DIAGNOSIS — O26891 Other specified pregnancy related conditions, first trimester: Secondary | ICD-10-CM | POA: Diagnosis not present

## 2024-01-25 LAB — COMPREHENSIVE METABOLIC PANEL WITH GFR
ALT: 40 U/L (ref 0–44)
AST: 25 U/L (ref 15–41)
Albumin: 3.6 g/dL (ref 3.5–5.0)
Alkaline Phosphatase: 39 U/L (ref 38–126)
Anion gap: 9 (ref 5–15)
BUN: <5 mg/dL — ABNORMAL LOW (ref 6–20)
CO2: 22 mmol/L (ref 22–32)
Calcium: 8.6 mg/dL — ABNORMAL LOW (ref 8.9–10.3)
Chloride: 103 mmol/L (ref 98–111)
Creatinine, Ser: 0.39 mg/dL — ABNORMAL LOW (ref 0.44–1.00)
GFR, Estimated: >60 mL/min (ref >60)
Glucose, Bld: 92 mg/dL (ref 70–99)
Potassium: 3.1 mmol/L — ABNORMAL LOW (ref 3.5–5.1)
Sodium: 134 mmol/L — ABNORMAL LOW (ref 135–145)
Total Bilirubin: 0.8 mg/dL (ref 0.0–1.2)
Total Protein: 6.4 g/dL — ABNORMAL LOW (ref 6.5–8.1)

## 2024-01-25 LAB — CBC
HCT: 33.4 % — ABNORMAL LOW (ref 36.0–46.0)
Hemoglobin: 11.7 g/dL — ABNORMAL LOW (ref 12.0–15.0)
MCH: 31.3 pg (ref 26.0–34.0)
MCHC: 35 g/dL (ref 30.0–36.0)
MCV: 89.3 fL (ref 80.0–100.0)
Platelets: 187 10*3/uL (ref 150–400)
RBC: 3.74 MIL/uL — ABNORMAL LOW (ref 3.87–5.11)
RDW: 12.2 % (ref 11.5–15.5)
WBC: 11.1 10*3/uL — ABNORMAL HIGH (ref 4.0–10.5)
nRBC: 0 % (ref 0.0–0.2)

## 2024-01-25 MED ORDER — METRONIDAZOLE 0.75 % VA GEL: 1.0000 | Freq: Every day | VAGINAL | 0 refills | Status: DC

## 2024-01-25 MED ORDER — ONDANSETRON 4 MG PO TBDP: 4.0000 mg | ORAL_TABLET | Freq: Three times a day (TID) | ORAL | 0 refills | Status: AC | PRN

## 2024-01-25 MED ORDER — POTASSIUM CHLORIDE CRYS ER 20 MEQ PO TBCR: 20.0000 meq | EXTENDED_RELEASE_TABLET | Freq: Two times a day (BID) | ORAL | 0 refills | Status: DC

## 2024-01-25 MED ORDER — FOLIC ACID 1 MG PO TABS: 1.0000 mg | ORAL_TABLET | Freq: Every day | ORAL | Status: DC

## 2024-01-25 MED ORDER — VITAMIN B-1 100 MG PO TABS: 100.0000 mg | ORAL_TABLET | Freq: Every day | ORAL | 0 refills | Status: DC

## 2024-01-25 MED ORDER — THIAMINE HCL 100 MG/ML IJ SOLN
Freq: Once | INTRAVENOUS | Status: AC
  Filled 2024-01-25: qty 1000

## 2024-01-25 MED ORDER — FAMOTIDINE 20 MG PO TABS: 20.0000 mg | ORAL_TABLET | Freq: Two times a day (BID) | ORAL | Status: AC

## 2024-01-25 MED ORDER — ACETAMINOPHEN 500 MG PO TABS: 1000.0000 mg | ORAL_TABLET | Freq: Four times a day (QID) | ORAL | Status: AC | PRN

## 2024-01-25 MED ORDER — PROMETHAZINE HCL 25 MG PO TABS: 25.0000 mg | ORAL_TABLET | Freq: Four times a day (QID) | ORAL | 2 refills | Status: AC | PRN

## 2024-01-25 MED FILL — Multiple Vitamin IV Soln: INTRAVENOUS | Qty: 10 | Status: AC

## 2024-01-25 MED FILL — Potassium Chloride Inj 2 mEq/ML: INTRAVENOUS | Qty: 20 | Status: AC

## 2024-01-25 MED FILL — Dextrose 5% w/ Sodium Chloride 0.45%: INTRAVENOUS | Qty: 1000 | Status: AC

## 2024-01-25 MED FILL — Thiamine HCl Inj 100 MG/ML: INTRAMUSCULAR | Qty: 1 | Status: AC

## 2024-01-25 MED FILL — Folic Acid Inj 5 MG/ML: INTRAMUSCULAR | Qty: 0.2 | Status: AC

## 2024-01-25 NOTE — Progress Notes (Signed)
 Arzella Laurence, CNM notified at [phone], that patient could not tolerate vitamin IV infusion- patient requested the infusion be stopped, patients weight, emesis- , nausea, and patient requesting pain medication at this time. No new orders given at this time.    Patient requesting discharge home at this time.  CNM notified at 55. CNM will put in orders.   Erlene Hawks, RN 01/25/24 @1607 

## 2024-01-25 NOTE — Discharge Summary (Signed)
 Patient ID: Tara Gonzalez MRN: [id number] DOB/AGE: 1995/11/15 28 y.o.  Admit date: 01/24/2024 Discharge date: 01/25/2024  Admission Diagnoses: N/V in pregnancy   Factors complicating pregnancy: Unwanted pregnancy Hyperemesis gravidarum Anxiety and depression Hypokalemia Bipolar pregnancy  Prenatal Procedures: ultrasound  Consults: None Significant Diagnostic Studies:  Results for orders placed or performed during the hospital encounter of 01/24/24 (from the past week)  Comprehensive metabolic panel   Collection Time: 01/24/24  6:28 AM  Result Value Ref Range   Sodium 136 135 - 145 mmol/L   Potassium 3.2 (L) 3.5 - 5.1 mmol/L   Chloride 106 98 - 111 mmol/L   CO2 22 22 - 32 mmol/L   Glucose, Bld 105 (H) 70 - 99 mg/dL   BUN 7 6 - 20 mg/dL   Creatinine, Ser 4.09 0.44 - 1.00 mg/dL   Calcium  8.7 (L) 8.9 - 10.3 mg/dL   Total Protein 6.8 6.5 - 8.1 g/dL   Albumin 3.8 3.5 - 5.0 g/dL   AST 35 15 - 41 U/L   ALT 36 0 - 44 U/L   Alkaline Phosphatase 40 38 - 126 U/L   Total Bilirubin 0.4 0.0 - 1.2 mg/dL   GFR, Estimated >81 >19 mL/min   Anion gap 8 5 - 15  CBC with Differential   Collection Time: 01/24/24  6:28 AM  Result Value Ref Range   WBC 10.8 (H) 4.0 - 10.5 K/uL   RBC 3.97 3.87 - 5.11 MIL/uL   Hemoglobin 12.0 12.0 - 15.0 g/dL   HCT 14.7 (L) 82.9 - 56.2 %   MCV 90.4 80.0 - 100.0 fL   MCH 30.2 26.0 - 34.0 pg   MCHC 33.4 30.0 - 36.0 g/dL   RDW 13.0 86.5 - 78.4 %   Platelets 198 150 - 400 K/uL   nRBC 0.0 0.0 - 0.2 %   Neutrophils Relative % 84 %   Neutro Abs 9.1 (H) 1.7 - 7.7 K/uL   Lymphocytes Relative 11 %   Lymphs Abs 1.1 0.7 - 4.0 K/uL   Monocytes Relative 4 %   Monocytes Absolute 0.5 0.1 - 1.0 K/uL   Eosinophils Relative 0 %   Eosinophils Absolute 0.0 0.0 - 0.5 K/uL   Basophils Relative 0 %   Basophils Absolute 0.0 0.0 - 0.1 K/uL   Immature Granulocytes 1 %   Abs Immature Granulocytes 0.07 0.00 - 0.07 K/uL  Magnesium    Collection Time: 01/24/24  6:28 AM  Result  Value Ref Range   Magnesium  1.9 1.7 - 2.4 mg/dL  Urinalysis, w/ Reflex to Culture (Infection Suspected) -Urine, Clean Catch   Collection Time: 01/24/24 11:02 AM  Result Value Ref Range   Specimen Source URINE, CLEAN CATCH    Color, Urine YELLOW (A) YELLOW   APPearance CLOUDY (A) CLEAR   Specific Gravity, Urine 1.012 1.005 - 1.030   pH 8.0 5.0 - 8.0   Glucose, UA NEGATIVE NEGATIVE mg/dL   Hgb urine dipstick NEGATIVE NEGATIVE   Bilirubin Urine NEGATIVE NEGATIVE   Ketones, ur 5 (A) NEGATIVE mg/dL   Protein, ur NEGATIVE NEGATIVE mg/dL   Nitrite NEGATIVE NEGATIVE   Leukocytes,Ua NEGATIVE NEGATIVE   RBC / HPF 0-5 0 - 5 RBC/hpf   WBC, UA 0-5 0 - 5 WBC/hpf   Bacteria, UA RARE (A) NONE SEEN   Squamous Epithelial / HPF 6-10 0 - 5 /HPF   Mucus PRESENT    Budding Yeast PRESENT    Amorphous Crystal PRESENT   Wet prep, genital   Collection  Time: 01/24/24 11:35 AM   Specimen: Vaginal  Result Value Ref Range   Yeast Wet Prep HPF POC NONE SEEN NONE SEEN   Trich, Wet Prep NONE SEEN NONE SEEN   Clue Cells Wet Prep HPF POC PRESENT (A) NONE SEEN   WBC, Wet Prep HPF POC <10 <10   Sperm NONE SEEN   CBC   Collection Time: 01/25/24  7:19 AM  Result Value Ref Range   WBC 11.1 (H) 4.0 - 10.5 K/uL   RBC 3.74 (L) 3.87 - 5.11 MIL/uL   Hemoglobin 11.7 (L) 12.0 - 15.0 g/dL   HCT 16.1 (L) 09.6 - 04.5 %   MCV 89.3 80.0 - 100.0 fL   MCH 31.3 26.0 - 34.0 pg   MCHC 35.0 30.0 - 36.0 g/dL   RDW 40.9 81.1 - 91.4 %   Platelets 187 150 - 400 K/uL   nRBC 0.0 0.0 - 0.2 %  Comprehensive metabolic panel   Collection Time: 01/25/24  7:19 AM  Result Value Ref Range   Sodium 134 (L) 135 - 145 mmol/L   Potassium 3.1 (L) 3.5 - 5.1 mmol/L   Chloride 103 98 - 111 mmol/L   CO2 22 22 - 32 mmol/L   Glucose, Bld 92 70 - 99 mg/dL   BUN <5 (L) 6 - 20 mg/dL   Creatinine, Ser 7.82 (L) 0.44 - 1.00 mg/dL   Calcium  8.6 (L) 8.9 - 10.3 mg/dL   Total Protein 6.4 (L) 6.5 - 8.1 g/dL   Albumin 3.6 3.5 - 5.0 g/dL   AST 25 15 -  41 U/L   ALT 40 0 - 44 U/L   Alkaline Phosphatase 39 38 - 126 U/L   Total Bilirubin 0.8 0.0 - 1.2 mg/dL   GFR, Estimated >95 >62 mL/min   Anion gap 9 5 - 15  Results for orders placed or performed during the hospital encounter of 01/23/24 (from the past week)  CBC   Collection Time: 01/23/24  8:21 AM  Result Value Ref Range   WBC 11.2 (H) 4.0 - 10.5 K/uL   RBC 4.38 3.87 - 5.11 MIL/uL   Hemoglobin 13.4 12.0 - 15.0 g/dL   HCT 13.0 86.5 - 78.4 %   MCV 88.6 80.0 - 100.0 fL   MCH 30.6 26.0 - 34.0 pg   MCHC 34.5 30.0 - 36.0 g/dL   RDW 69.6 29.5 - 28.4 %   Platelets 220 150 - 400 K/uL   nRBC 0.0 0.0 - 0.2 %  Comprehensive metabolic panel   Collection Time: 01/23/24  8:21 AM  Result Value Ref Range   Sodium 134 (L) 135 - 145 mmol/L   Potassium 4.2 3.5 - 5.1 mmol/L   Chloride 102 98 - 111 mmol/L   CO2 19 (L) 22 - 32 mmol/L   Glucose, Bld 115 (H) 70 - 99 mg/dL   BUN 12 6 - 20 mg/dL   Creatinine, Ser 1.32 0.44 - 1.00 mg/dL   Calcium  9.4 8.9 - 10.3 mg/dL   Total Protein 7.8 6.5 - 8.1 g/dL   Albumin 4.3 3.5 - 5.0 g/dL   AST 27 15 - 41 U/L   ALT 29 0 - 44 U/L   Alkaline Phosphatase 44 38 - 126 U/L   Total Bilirubin 0.9 0.0 - 1.2 mg/dL   GFR, Estimated >44 >01 mL/min   Anion gap 13 5 - 15  hCG, quantitative, pregnancy   Collection Time: 01/23/24  8:21 AM  Result Value Ref Range   hCG,  Beta Chain, Quant, S 232,131 (H) <5 mIU/mL    Treatments: IV hydration and IV antiemetics  Hospital Course:  This is a 28 year old G3P2002 patient with an intrauterine pregnancy at 9 weeks and 1 day, who was admitted due to nausea, vomiting, and abdominal pain. She received treatment that included intravenous hydration, a banana bag, and intravenous antiemetics. This morning, she reported some improvement in her nausea and vomiting and expressed a desire to return home. She has declined intravenous Tylenol  and the banana bag. Additionally, she requested prescriptions for Zofran  and Phenergan , indicating  her wish to be discharged. She mentioned having an appointment for pregnancy termination scheduled for next Thursday. The patient was assessed and deemed stable for discharge to her home, with a recommendation for outpatient follow-up.  Discharge Physical Exam:  BP (!) 103/57 (BP Location: Left Arm)   Pulse 76   Temp 99.3 F (37.4 C) (Oral)   Resp 18   Ht 5\' 1"  (1.549 m)   Wt 58.8 kg   LMP 11/29/2023 (Exact Date)   SpO2 100%   BMI 24.49 kg/m   General: NAD CV: RRR Pulm: CTABL, nl effort ABD: s/nd/nt, gravid DVT Evaluation: LE non-ttp, no evidence of DVT on exam.  Discharge Condition: Stable  Disposition: Discharge disposition: 01-Home or Self Care        Allergies as of 01/25/2024   No Known Allergies      Medication List     STOP taking these medications    pantoprazole  40 MG tablet Commonly known as: Protonix    polyethylene glycol 17 g packet Commonly known as: MIRALAX  / GLYCOLAX        TAKE these medications    acetaminophen  500 MG tablet Commonly known as: TYLENOL  Take 2 tablets (1,000 mg total) by mouth every 6 (six) hours as needed.   famotidine  20 MG tablet Commonly known as: PEPCID  Take 1 tablet (20 mg total) by mouth 2 (two) times daily.   folic acid 1 MG tablet Commonly known as: FOLVITE Take 1 tablet (1 mg total) by mouth daily for 20 days.   metroNIDAZOLE  0.75 % vaginal gel Commonly known as: METROGEL  Place 1 Applicatorful vaginally at bedtime.   ondansetron  4 MG disintegrating tablet Commonly known as: ZOFRAN -ODT Take 1 tablet (4 mg total) by mouth every 8 (eight) hours as needed for nausea or vomiting.   potassium chloride  SA 20 MEQ tablet Commonly known as: KLOR-CON  M Take 1 tablet (20 mEq total) by mouth 2 (two) times daily for 5 days.   promethazine  25 MG tablet Commonly known as: PHENERGAN  Take 1 tablet (25 mg total) by mouth every 6 (six) hours as needed for nausea or vomiting. What changed: Another medication with the  same name was removed. Continue taking this medication, and follow the directions you see here.   thiamine 100 MG tablet Commonly known as: Vitamin B-1 Take 1 tablet (100 mg total) by mouth daily for 20 days. Start taking on: Jan 26, 2024         Signed:  Arzella Laurence, [facility] 01/25/2024 5:48 PM

## 2024-01-25 NOTE — Plan of Care (Signed)
 Patient will be discharged home. Patients mother will come pick up the patient.  Discharge instructions, when to follow up, and prescriptions reviewed with patient. Patient verbalized understanding.Patient states she has an appointment scheduled for an abortion on Thursday 02/01/24.  Patient will be escorted out by RN. Erlene Hawks, RN 01/25/24 @ 1800

## 2024-01-26 ENCOUNTER — Emergency Department
Admission: EM | Admit: 2024-01-26 | Discharge: 2024-01-26 | Disposition: A | Discharge status: Home / Self Care | Attending: Emergency Medicine | Admitting: Emergency Medicine

## 2024-01-26 ENCOUNTER — Other Ambulatory Visit: Payer: Self-pay

## 2024-01-26 DIAGNOSIS — D72829 Elevated white blood cell count, unspecified: Secondary | ICD-10-CM | POA: Insufficient documentation

## 2024-01-26 DIAGNOSIS — E876 Hypokalemia: Secondary | ICD-10-CM | POA: Diagnosis not present

## 2024-01-26 DIAGNOSIS — O99281 Endocrine, nutritional and metabolic diseases complicating pregnancy, first trimester: Secondary | ICD-10-CM | POA: Diagnosis not present

## 2024-01-26 DIAGNOSIS — R7401 Elevation of levels of liver transaminase levels: Secondary | ICD-10-CM | POA: Insufficient documentation

## 2024-01-26 DIAGNOSIS — O99111 Other diseases of the blood and blood-forming organs and certain disorders involving the immune mechanism complicating pregnancy, first trimester: Secondary | ICD-10-CM | POA: Diagnosis not present

## 2024-01-26 DIAGNOSIS — O219 Vomiting of pregnancy, unspecified: Secondary | ICD-10-CM | POA: Diagnosis not present

## 2024-01-26 DIAGNOSIS — R112 Nausea with vomiting, unspecified: Secondary | ICD-10-CM | POA: Diagnosis not present

## 2024-01-26 DIAGNOSIS — N3000 Acute cystitis without hematuria: Secondary | ICD-10-CM | POA: Diagnosis not present

## 2024-01-26 DIAGNOSIS — O2311 Infections of bladder in pregnancy, first trimester: Secondary | ICD-10-CM | POA: Insufficient documentation

## 2024-01-26 DIAGNOSIS — Z3A09 9 weeks gestation of pregnancy: Secondary | ICD-10-CM | POA: Insufficient documentation

## 2024-01-26 DIAGNOSIS — Z3A01 Less than 8 weeks gestation of pregnancy: Secondary | ICD-10-CM | POA: Diagnosis not present

## 2024-01-26 LAB — COMPREHENSIVE METABOLIC PANEL WITH GFR
ALT: 73 U/L — ABNORMAL HIGH (ref 0–44)
AST: 35 U/L (ref 15–41)
Albumin: 4.3 g/dL (ref 3.5–5.0)
Alkaline Phosphatase: 47 U/L (ref 38–126)
Anion gap: 12 (ref 5–15)
BUN: 10 mg/dL (ref 6–20)
CO2: 22 mmol/L (ref 22–32)
Calcium: 9.3 mg/dL (ref 8.9–10.3)
Chloride: 100 mmol/L (ref 98–111)
Creatinine, Ser: 0.48 mg/dL (ref 0.44–1.00)
GFR, Estimated: >60 mL/min (ref >60)
Glucose, Bld: 101 mg/dL — ABNORMAL HIGH (ref 70–99)
Potassium: 3.2 mmol/L — ABNORMAL LOW (ref 3.5–5.1)
Sodium: 134 mmol/L — ABNORMAL LOW (ref 135–145)
Total Bilirubin: 0.6 mg/dL (ref 0.0–1.2)
Total Protein: 7.4 g/dL (ref 6.5–8.1)

## 2024-01-26 LAB — CBC WITH DIFFERENTIAL/PLATELET
Abs Immature Granulocytes: 0.06 10*3/uL (ref 0.00–0.07)
Basophils Absolute: 0 10*3/uL (ref 0.0–0.1)
Basophils Relative: 0 %
Eosinophils Absolute: 0 10*3/uL (ref 0.0–0.5)
Eosinophils Relative: 0 %
HCT: 38.5 % (ref 36.0–46.0)
Hemoglobin: 13.5 g/dL (ref 12.0–15.0)
Immature Granulocytes: 0 %
Lymphocytes Relative: 11 %
Lymphs Abs: 1.6 10*3/uL (ref 0.7–4.0)
MCH: 31 pg (ref 26.0–34.0)
MCHC: 35.1 g/dL (ref 30.0–36.0)
MCV: 88.3 fL (ref 80.0–100.0)
Monocytes Absolute: 0.9 10*3/uL (ref 0.1–1.0)
Monocytes Relative: 6 %
Neutro Abs: 12.6 10*3/uL — ABNORMAL HIGH (ref 1.7–7.7)
Neutrophils Relative %: 83 %
Platelets: 231 10*3/uL (ref 150–400)
RBC: 4.36 MIL/uL (ref 3.87–5.11)
RDW: 12.3 % (ref 11.5–15.5)
WBC: 15.2 10*3/uL — ABNORMAL HIGH (ref 4.0–10.5)
nRBC: 0 % (ref 0.0–0.2)

## 2024-01-26 LAB — URINALYSIS, ROUTINE W REFLEX MICROSCOPIC
Bilirubin Urine: NEGATIVE
Glucose, UA: NEGATIVE mg/dL
Hgb urine dipstick: NEGATIVE
Ketones, ur: 20 mg/dL — AB
Nitrite: NEGATIVE
Protein, ur: 30 mg/dL — AB
Specific Gravity, Urine: 1.02 (ref 1.005–1.030)
WBC, UA: 0 WBC/hpf (ref 0–5)
pH: 9 — ABNORMAL HIGH (ref 5.0–8.0)

## 2024-01-26 LAB — HCG, QUANTITATIVE, PREGNANCY: hCG, Beta Chain, Quant, S: 196426 m[IU]/mL — ABNORMAL HIGH (ref <5)

## 2024-01-26 MED ORDER — ONDANSETRON HCL 4 MG/2ML IJ SOLN
4.0000 mg | Freq: Once | INTRAMUSCULAR | Status: AC
Start: 2024-01-26 — End: 2024-01-26
  Administered 2024-01-26: 4 mg via INTRAVENOUS
  Filled 2024-01-26: qty 2

## 2024-01-26 MED ORDER — CEPHALEXIN 500 MG PO CAPS
500.0000 mg | ORAL_CAPSULE | Freq: Three times a day (TID) | ORAL | 0 refills | Status: DC
Start: 2024-01-26 — End: 2024-01-30

## 2024-01-26 MED ORDER — ACETAMINOPHEN 10 MG/ML IV SOLN
1000.0000 mg | Freq: Four times a day (QID) | INTRAVENOUS | Status: DC
Start: 2024-01-26 — End: 2024-01-26

## 2024-01-26 MED ORDER — METOCLOPRAMIDE HCL 5 MG/ML IJ SOLN
10.0000 mg | Freq: Once | INTRAMUSCULAR | Status: AC
  Administered 2024-01-26: 10 mg via INTRAVENOUS
  Filled 2024-01-26: qty 2

## 2024-01-26 MED ORDER — LACTATED RINGERS IV BOLUS
1000.0000 mL | Freq: Once | INTRAVENOUS | Status: AC
  Administered 2024-01-26: 1000 mL via INTRAVENOUS

## 2024-01-26 MED ORDER — METOCLOPRAMIDE HCL 10 MG PO TABS: 10.0000 mg | ORAL_TABLET | Freq: Three times a day (TID) | ORAL | 1 refills | Status: DC

## 2024-01-26 NOTE — Discharge Instructions (Signed)
 Please follow-up with your midwife/OB/GYN.  Clear liquids today and progressed to bland foods tomorrow if tolerated.  Return to the emergency department if you are unable to control the nausea and vomiting or if you develop vaginal bleeding.

## 2024-01-26 NOTE — ED Provider Notes (Signed)
 [facility] Provider Note    Event Date/Time   First MD Initiated Contact with Patient 01/26/24 1323     (approximate)   History   Nausea   HPI  Tara Gonzalez is a 28 y.o. female  with history of bipolar disorder, anemia and as listed in EMR presents to the emergency department for treatment and evaluation of nausea, vomiting, and diffuse abdominal pain.  She is approximately [redacted] weeks pregnant.  She has been evaluated multiple times for the same.  She has been prescribed Zofran  and Phenergan .  The Zofran  makes her vomit and Phenergan  does not stay down long enough to work.  She has vomited several times today.  She denies vaginal discharge, gush of fluids, or vaginal bleeding.  Symptoms today are no different than previous.      Physical Exam   Triage Vital Signs: ED Triage Vitals  Encounter Vitals Group     BP 01/26/24 1318 130/89     Systolic BP Percentile --      Diastolic BP Percentile --      Pulse Rate 01/26/24 1318 76     Resp 01/26/24 1318 18     Temp 01/26/24 1318 98 F (36.7 C)     Temp src --      SpO2 01/26/24 1318 100 %     Weight 01/26/24 1317 125 lb (56.7 kg)     Height 01/26/24 1317 5\' 1"  (1.549 m)     Head Circumference --      Peak Flow --      Pain Score 01/26/24 1317 3     Pain Loc --      Pain Education --      Exclude from Growth Chart --     Most recent vital signs: Vitals:   01/26/24 1318 01/26/24 1705  BP: 130/89 (!) 128/93  Pulse: 76 80  Resp: 18 18  Temp: 98 F (36.7 C) 98.9 F (37.2 C)  SpO2: 100% 95%    General: Awake, no distress.  CV:  Good peripheral perfusion.  Resp:  Normal effort.  Abd:  No distention.  Soft.  No focal tenderness. Other:     ED Results / Procedures / Treatments   Labs (all labs ordered are listed, but only abnormal results are displayed) Labs Reviewed  CBC WITH DIFFERENTIAL/PLATELET - Abnormal; Notable for the following components:      Result Value   WBC 15.2 (*)     Neutro Abs 12.6 (*)    All other components within normal limits  COMPREHENSIVE METABOLIC PANEL WITH GFR - Abnormal; Notable for the following components:   Sodium 134 (*)    Potassium 3.2 (*)    Glucose, Bld 101 (*)    ALT 73 (*)    All other components within normal limits  HCG, QUANTITATIVE, PREGNANCY - Abnormal; Notable for the following components:   hCG, Beta Chain, Quant, [place] 324,401 (*)    All other components within normal limits  URINALYSIS, ROUTINE W REFLEX MICROSCOPIC - Abnormal; Notable for the following components:   Color, Urine YELLOW (*)    APPearance TURBID (*)    pH 9.0 (*)    Ketones, ur 20 (*)    Protein, ur 30 (*)    Leukocytes,Ua TRACE (*)    Bacteria, UA RARE (*)    All other components within normal limits     EKG  Not indicated   RADIOLOGY  Image and radiology report reviewed and interpreted  by me. Radiology report consistent with the same.  Not indicated  PROCEDURES:  Critical Care performed: No  Procedures   MEDICATIONS ORDERED IN ED:  Medications  acetaminophen  (OFIRMEV ) IV 1,000 mg (1,000 mg Intravenous Patient Refused/Not Given 01/26/24 1356)  lactated ringers  bolus 1,000 mL (0 mLs Intravenous Stopped 01/26/24 1528)  metoCLOPramide  (REGLAN ) injection 10 mg (10 mg Intravenous Given 01/26/24 1410)  ondansetron  (ZOFRAN ) injection 4 mg (4 mg Intravenous Given 01/26/24 1615)     IMPRESSION / MDM / ASSESSMENT AND PLAN / ED COURSE   I have reviewed the triage note.  Differential diagnosis includes, but is not limited to, hyperemesis gravidarum, viral syndrome  Patient's presentation is most consistent with acute illness / injury with system symptoms.  28 year old female presenting to the emergency department for treatment and evaluation of nausea and vomiting and diffuse abdominal pain that has been ongoing during her 9-week pregnancy and resistant to Zofran  or Phenergan .  See HPI for further details.  Exam is reassuring.  Plan will be to  give her some fluids and Reglan .  She had imaging done on 01/24/2024 of confirming and a single IUP measuring 9 weeks with a fetal heart rate of 158.  There was no subchorionic hemorrhage or other acute concerns visualized.  No indication for repeat imaging today as the patient denies vaginal discharge or bleeding and has had no change in quality or location of pain.  Patient states that this pregnancy is unwanted and she has a scheduled appointment at the clinic for termination.  After IV fluids, Reglan , and Zofran  patient's symptoms improved.  Her labs show a white blood cell count of 15.2 with neutrophil count of 12.6, beta-hCG of nearly 200,000, CMP showing a mild hypokalemia and elevation of the ALT.  Urinalysis shows rare bacteria, trace leukocytes, 30 of protein, 28 ketones with a pH of 9 and a turbid specimen.  Patient states that she has been urinating more frequently and has noticed today that the urine is malodorous and darker than usual.  She also reports that during her last pregnancy she had a severe kidney infection that resulted in hospitalization for IV antibiotics.  Plan will be to discharge her home with antibiotics and Reglan .  She was advised to follow-up with her OB/GYN and ER return precautions were discussed.  I did consider admission due to numerous ER visits and previous admission for same symptoms however patient prefers to go home.      FINAL CLINICAL IMPRESSION(S) / ED DIAGNOSES   Final diagnoses:  Nausea and vomiting in pregnancy  Acute cystitis without hematuria     Rx / DC Orders   ED Discharge Orders          Ordered    cephALEXin  (KEFLEX ) 500 MG capsule  3 times daily        01/26/24 1659    metoCLOPramide  (REGLAN ) 10 MG tablet  3 times daily before meals & bedtime        01/26/24 1659             Note:  This document was prepared using Dragon voice recognition software and may include unintentional dictation errors.   Sherryle Don,  FNP 01/26/24 1959    Lubertha Rush, MD 01/27/24 [phone]

## 2024-01-26 NOTE — ED Notes (Signed)
 Pt asked if she could give a urine sample and she stated she could not at this moment. This RN told pt I would come back at a later time to see if she could then.

## 2024-01-26 NOTE — ED Triage Notes (Signed)
 Pt comes with c/o nausea . Pt is about [redacted] weeks pregnant. Pt was seen here recently for same and prescribed phenergan  but no relief. Pt states she is still feeling bad.

## 2024-01-29 ENCOUNTER — Other Ambulatory Visit: Payer: Self-pay

## 2024-01-29 ENCOUNTER — Observation Stay
Admission: EM | Admit: 2024-01-29 | Discharge: 2024-01-30 | Disposition: A | Discharge status: Home / Self Care | Attending: Obstetrics and Gynecology | Admitting: Obstetrics and Gynecology

## 2024-01-29 ENCOUNTER — Encounter: Payer: Self-pay | Admitting: Obstetrics and Gynecology

## 2024-01-29 DIAGNOSIS — Z3A01 Less than 8 weeks gestation of pregnancy: Secondary | ICD-10-CM | POA: Diagnosis not present

## 2024-01-29 DIAGNOSIS — O99281 Endocrine, nutritional and metabolic diseases complicating pregnancy, first trimester: Secondary | ICD-10-CM | POA: Insufficient documentation

## 2024-01-29 DIAGNOSIS — F419 Anxiety disorder, unspecified: Secondary | ICD-10-CM | POA: Diagnosis not present

## 2024-01-29 DIAGNOSIS — O210 Mild hyperemesis gravidarum: Secondary | ICD-10-CM | POA: Diagnosis not present

## 2024-01-29 DIAGNOSIS — F32A Depression, unspecified: Secondary | ICD-10-CM | POA: Insufficient documentation

## 2024-01-29 DIAGNOSIS — E876 Hypokalemia: Secondary | ICD-10-CM

## 2024-01-29 DIAGNOSIS — Z3A09 9 weeks gestation of pregnancy: Secondary | ICD-10-CM | POA: Diagnosis not present

## 2024-01-29 DIAGNOSIS — O99341 Other mental disorders complicating pregnancy, first trimester: Secondary | ICD-10-CM | POA: Diagnosis not present

## 2024-01-29 DIAGNOSIS — R1110 Vomiting, unspecified: Secondary | ICD-10-CM | POA: Diagnosis present

## 2024-01-29 LAB — COMPREHENSIVE METABOLIC PANEL WITH GFR
ALT: 87 U/L — ABNORMAL HIGH (ref 0–44)
AST: 40 U/L (ref 15–41)
Albumin: 4.3 g/dL (ref 3.5–5.0)
Alkaline Phosphatase: 48 U/L (ref 38–126)
Anion gap: 13 (ref 5–15)
BUN: 13 mg/dL (ref 6–20)
CO2: 26 mmol/L (ref 22–32)
Calcium: 9.2 mg/dL (ref 8.9–10.3)
Chloride: 97 mmol/L — ABNORMAL LOW (ref 98–111)
Creatinine, Ser: 0.56 mg/dL (ref 0.44–1.00)
GFR, Estimated: >60 mL/min (ref >60)
Glucose, Bld: 113 mg/dL — ABNORMAL HIGH (ref 70–99)
Potassium: 2.9 mmol/L — ABNORMAL LOW (ref 3.5–5.1)
Sodium: 136 mmol/L (ref 135–145)
Total Bilirubin: 0.9 mg/dL (ref 0.0–1.2)
Total Protein: 7.6 g/dL (ref 6.5–8.1)

## 2024-01-29 LAB — CBC WITH DIFFERENTIAL/PLATELET
Abs Immature Granulocytes: 0.14 10*3/uL — ABNORMAL HIGH (ref 0.00–0.07)
Basophils Absolute: 0 10*3/uL (ref 0.0–0.1)
Basophils Relative: 0 %
Eosinophils Absolute: 0.1 10*3/uL (ref 0.0–0.5)
Eosinophils Relative: 1 %
HCT: 42.6 % (ref 36.0–46.0)
Hemoglobin: 14.4 g/dL (ref 12.0–15.0)
Immature Granulocytes: 1 %
Lymphocytes Relative: 16 %
Lymphs Abs: 2.2 10*3/uL (ref 0.7–4.0)
MCH: 29.9 pg (ref 26.0–34.0)
MCHC: 33.8 g/dL (ref 30.0–36.0)
MCV: 88.4 fL (ref 80.0–100.0)
Monocytes Absolute: 1.3 10*3/uL — ABNORMAL HIGH (ref 0.1–1.0)
Monocytes Relative: 10 %
Neutro Abs: 9.7 10*3/uL — ABNORMAL HIGH (ref 1.7–7.7)
Neutrophils Relative %: 72 %
Platelets: 241 10*3/uL (ref 150–400)
RBC: 4.82 MIL/uL (ref 3.87–5.11)
RDW: 12.4 % (ref 11.5–15.5)
WBC: 13.5 10*3/uL — ABNORMAL HIGH (ref 4.0–10.5)
nRBC: 0 % (ref 0.0–0.2)

## 2024-01-29 LAB — LIPASE, BLOOD: Lipase: 27 U/L (ref 11–51)

## 2024-01-29 LAB — URINALYSIS, ROUTINE W REFLEX MICROSCOPIC
Bilirubin Urine: NEGATIVE
Glucose, UA: NEGATIVE mg/dL
Hgb urine dipstick: NEGATIVE
Ketones, ur: NEGATIVE mg/dL
Leukocytes,Ua: NEGATIVE
Nitrite: NEGATIVE
Protein, ur: NEGATIVE mg/dL
Specific Gravity, Urine: 1.019 (ref 1.005–1.030)
pH: 8 (ref 5.0–8.0)

## 2024-01-29 LAB — T4, FREE: Free T4: 1.15 ng/dL — ABNORMAL HIGH (ref 0.61–1.12)

## 2024-01-29 LAB — MAGNESIUM: Magnesium: 2.1 mg/dL (ref 1.7–2.4)

## 2024-01-29 LAB — TSH: TSH: 0.038 u[IU]/mL — ABNORMAL LOW (ref 0.350–4.500)

## 2024-01-29 MED ORDER — SODIUM CHLORIDE 0.9 % IV SOLN: INTRAVENOUS | Status: AC | PRN

## 2024-01-29 MED ORDER — POTASSIUM CHLORIDE CRYS ER 20 MEQ PO TBCR
40.0000 meq | EXTENDED_RELEASE_TABLET | Freq: Once | ORAL | Status: DC
  Filled 2024-01-29: qty 2

## 2024-01-29 MED ORDER — FAMOTIDINE IN NACL 20-0.9 MG/50ML-% IV SOLN
20.0000 mg | Freq: Two times a day (BID) | INTRAVENOUS | Status: DC
  Administered 2024-01-29 – 2024-01-30 (×3): 20 mg via INTRAVENOUS
  Filled 2024-01-29 (×3): qty 50

## 2024-01-29 MED ORDER — SODIUM CHLORIDE 0.9 % IV SOLN
12.5000 mg | Freq: Once | INTRAVENOUS | Status: AC
  Administered 2024-01-29: 12.5 mg via INTRAVENOUS
  Filled 2024-01-29: qty 12.5

## 2024-01-29 MED ORDER — LACTATED RINGERS IV BOLUS
1000.0000 mL | Freq: Once | INTRAVENOUS | Status: AC
  Administered 2024-01-29: 1000 mL via INTRAVENOUS

## 2024-01-29 MED ORDER — THIAMINE HCL 100 MG/ML IJ SOLN
Freq: Once | INTRAVENOUS | Status: AC
  Filled 2024-01-29: qty 1000

## 2024-01-29 MED ORDER — METOCLOPRAMIDE HCL 5 MG/ML IJ SOLN
10.0000 mg | Freq: Once | INTRAMUSCULAR | Status: AC
  Administered 2024-01-29: 10 mg via INTRAVENOUS
  Filled 2024-01-29: qty 2

## 2024-01-29 MED ORDER — MORPHINE SULFATE (PF) 2 MG/ML IV SOLN
2.0000 mg | Freq: Once | INTRAVENOUS | Status: AC
  Administered 2024-01-29: 2 mg via INTRAVENOUS
  Filled 2024-01-29: qty 1

## 2024-01-29 MED ORDER — PROCHLORPERAZINE EDISYLATE 10 MG/2ML IJ SOLN
10.0000 mg | Freq: Four times a day (QID) | INTRAMUSCULAR | Status: DC | PRN
  Administered 2024-01-29 – 2024-01-30 (×4): 10 mg via INTRAVENOUS
  Filled 2024-01-29 (×6): qty 2

## 2024-01-29 MED ORDER — POTASSIUM CHLORIDE 10 MEQ/100ML IV SOLN
10.0000 meq | INTRAVENOUS | Status: AC
  Administered 2024-01-29 (×3): 10 meq via INTRAVENOUS
  Filled 2024-01-29 (×3): qty 100

## 2024-01-29 MED ORDER — METOCLOPRAMIDE HCL 5 MG/ML IJ SOLN
10.0000 mg | Freq: Four times a day (QID) | INTRAMUSCULAR | Status: DC
  Administered 2024-01-29 – 2024-01-30 (×4): 10 mg via INTRAVENOUS
  Filled 2024-01-29 (×4): qty 2

## 2024-01-29 NOTE — ED Notes (Signed)
 See triage notes. Patient c/o vomiting, epigastric pain and just generally being unable to hold anything down for the past two weeks. Patient is [redacted] weeks pregnant.

## 2024-01-29 NOTE — Progress Notes (Signed)
 Patient reports no emesis since today at 1234, ice chips given to the patient per CNM order.  Erlene Hawks, RN 01/29/24@1619 

## 2024-01-29 NOTE — ED Provider Notes (Signed)
 [facility] Provider Note    Event Date/Time   First MD Initiated Contact with Patient 01/29/24 [id number]     (approximate)   History   Chief Complaint Emesis   HPI  Tara Gonzalez is a 28 y.o. female G3 P2-0-0-2 at approximately 9 weeks of pregnancy who presents to the ED complaining of nausea and vomiting.  Patient reports that she has been dealing with intermittent nausea and vomiting for much of her pregnancy, however it has gotten acutely worse over the past couple of days.  Patient reports that she has been feeling constantly nauseous and has been unable to keep down either liquids or solids during this time.  She was seen in the ED 3 days ago for similar symptoms, diagnosed with a UTI at that time but has been unable to keep down the antibiotics.  She denies any fevers or dysuria, but does report increasing pain in her left flank.  She also reports discomfort in her suprapubic area, has not had any vaginal bleeding or discharge.     Physical Exam   Triage Vital Signs: ED Triage Vitals  Encounter Vitals Group     BP 01/29/24 0620 (!) 133/98     Systolic BP Percentile --      Diastolic BP Percentile --      Pulse Rate 01/29/24 0620 96     Resp 01/29/24 0620 16     Temp 01/29/24 0620 97.8 F (36.6 C)     Temp Source 01/29/24 0620 Oral     SpO2 01/29/24 0620 96 %     Weight 01/29/24 0619 125 lb (56.7 kg)     Height 01/29/24 0619 5\' 1"  (1.549 m)     Head Circumference --      Peak Flow --      Pain Score 01/29/24 0620 8     Pain Loc --      Pain Education --      Exclude from Growth Chart --     Most recent vital signs: Vitals:   01/29/24 0620  BP: (!) 133/98  Pulse: 96  Resp: 16  Temp: 97.8 F (36.6 C)  SpO2: 96%    Constitutional: Alert and oriented. Eyes: Conjunctivae are normal. Head: Atraumatic. Nose: No congestion/rhinnorhea. Mouth/Throat: Mucous membranes are moist.  Cardiovascular: Normal rate, regular rhythm. Grossly normal  heart sounds.  2+ radial pulses bilaterally. Respiratory: Normal respiratory effort.  No retractions. Lungs CTAB. Gastrointestinal: Soft and nontender.  Left CVA tenderness to palpation noted.  No distention. Musculoskeletal: No lower extremity tenderness nor edema.  Neurologic:  Normal speech and language. No gross focal neurologic deficits are appreciated.    ED Results / Procedures / Treatments   Labs (all labs ordered are listed, but only abnormal results are displayed) Labs Reviewed  CBC WITH DIFFERENTIAL/PLATELET - Abnormal; Notable for the following components:      Result Value   WBC 13.5 (*)    Neutro Abs 9.7 (*)    Monocytes Absolute 1.3 (*)    Abs Immature Granulocytes 0.14 (*)    All other components within normal limits  COMPREHENSIVE METABOLIC PANEL WITH GFR - Abnormal; Notable for the following components:   Potassium 2.9 (*)    Chloride 97 (*)    Glucose, Bld 113 (*)    ALT 87 (*)    All other components within normal limits  LIPASE, BLOOD  MAGNESIUM   URINALYSIS, ROUTINE W REFLEX MICROSCOPIC     PROCEDURES:  Critical Care  performed: No  Procedures   MEDICATIONS ORDERED IN ED: Medications  potassium chloride  10 mEq in 100 mL IVPB (10 mEq Intravenous New Bag/Given 01/29/24 0913)  promethazine  (PHENERGAN ) 12.5 mg in sodium chloride  0.9 % 50 mL IVPB (0 mg Intravenous Stopped 01/29/24 0844)  morphine  (PF) 2 MG/ML injection 2 mg (2 mg Intravenous Given 01/29/24 0756)  lactated ringers  bolus 1,000 mL (1,000 mLs Intravenous New Bag/Given 01/29/24 0813)  metoCLOPramide  (REGLAN ) injection 10 mg (10 mg Intravenous Given 01/29/24 0910)     IMPRESSION / MDM / ASSESSMENT AND PLAN / ED COURSE  I reviewed the triage vital signs and the nursing notes.                              28 y.o. female G3 P2-0-0-2 at approximately 9 weeks of pregnancy who presents to the ED complaining of nausea and vomiting with suprapubic and left flank pain.  Patient's presentation is  most consistent with acute presentation with potential threat to life or bodily function.  Differential diagnosis includes, but is not limited to, hyperemesis gravidarum, dehydration, electrolyte abnormality, AKI, UTI, kidney stone.  Patient uncomfortable but nontoxic-appearing and in no acute distress, vital signs are unremarkable.  She has a benign abdominal exam but does have left CVA tenderness, most recent urinalysis with rare bacteria, will check repeat UA.  Lab results are pending at this time, will treat symptomatically with IV morphine  and Phenergan .  Labs with significant hypokalemia at 2.9, otherwise no significant AKI or anemia, mild leukocytosis noted and urinalysis pending at this time.  Patient continues to have significant nausea despite IV Phenergan  and Reglan .  Case discussed with OB team for admission due to hyperemesis gravidarum and intractable nausea and vomiting.      FINAL CLINICAL IMPRESSION(S) / ED DIAGNOSES   Final diagnoses:  Hyperemesis gravidarum  Hypokalemia     Rx / DC Orders   ED Discharge Orders     None        Note:  This document was prepared using Dragon voice recognition software and may include unintentional dictation errors.   Twilla Galea, MD 01/29/24 0930

## 2024-01-29 NOTE — Plan of Care (Signed)
 Patient tolerating ice chips, no emesis since today at 1234. Voiding, saline locked. SCDs on.  Erlene Hawks, RN 01/29/24 @1744 

## 2024-01-29 NOTE — ED Triage Notes (Signed)
 To ER from home with report of vomiting. [redacted] weeks pregnant. Unable to keep reglan  down at home. Complaining of epigastric pain. Intermittent vomiting for 2 weeks.

## 2024-01-29 NOTE — ED Notes (Signed)
 Report called to [facility] and transport to floor arranged.

## 2024-01-30 DIAGNOSIS — O211 Hyperemesis gravidarum with metabolic disturbance: Secondary | ICD-10-CM | POA: Diagnosis not present

## 2024-01-30 DIAGNOSIS — O23591 Infection of other part of genital tract in pregnancy, first trimester: Secondary | ICD-10-CM | POA: Diagnosis not present

## 2024-01-30 DIAGNOSIS — Z3A09 9 weeks gestation of pregnancy: Secondary | ICD-10-CM | POA: Diagnosis not present

## 2024-01-30 LAB — COMPREHENSIVE METABOLIC PANEL WITH GFR
ALT: 64 U/L — ABNORMAL HIGH (ref 0–44)
ALT: 61 U/L — ABNORMAL HIGH (ref 0–44)
AST: 27 U/L (ref 15–41)
AST: 25 U/L (ref 15–41)
Albumin: 3.4 g/dL — ABNORMAL LOW (ref 3.5–5.0)
Albumin: 3.4 g/dL — ABNORMAL LOW (ref 3.5–5.0)
Alkaline Phosphatase: 39 U/L (ref 38–126)
Alkaline Phosphatase: 42 U/L (ref 38–126)
Anion gap: 7 (ref 5–15)
Anion gap: 8 (ref 5–15)
BUN: 10 mg/dL (ref 6–20)
BUN: 10 mg/dL (ref 6–20)
CO2: 26 mmol/L (ref 22–32)
CO2: 23 mmol/L (ref 22–32)
Calcium: 8.5 mg/dL — ABNORMAL LOW (ref 8.9–10.3)
Calcium: 8.5 mg/dL — ABNORMAL LOW (ref 8.9–10.3)
Chloride: 103 mmol/L (ref 98–111)
Chloride: 104 mmol/L (ref 98–111)
Creatinine, Ser: 0.52 mg/dL (ref 0.44–1.00)
Creatinine, Ser: 0.43 mg/dL — ABNORMAL LOW (ref 0.44–1.00)
GFR, Estimated: >60 mL/min (ref >60)
GFR, Estimated: >60 mL/min (ref >60)
Glucose, Bld: 78 mg/dL (ref 70–99)
Glucose, Bld: 98 mg/dL (ref 70–99)
Potassium: 3.6 mmol/L (ref 3.5–5.1)
Potassium: 3.8 mmol/L (ref 3.5–5.1)
Sodium: 136 mmol/L (ref 135–145)
Sodium: 135 mmol/L (ref 135–145)
Total Bilirubin: 1.2 mg/dL (ref 0.0–1.2)
Total Bilirubin: 0.6 mg/dL (ref 0.0–1.2)
Total Protein: 6.1 g/dL — ABNORMAL LOW (ref 6.5–8.1)
Total Protein: 6.3 g/dL — ABNORMAL LOW (ref 6.5–8.1)

## 2024-01-30 LAB — CBC
HCT: 34.3 % — ABNORMAL LOW (ref 36.0–46.0)
Hemoglobin: 11.9 g/dL — ABNORMAL LOW (ref 12.0–15.0)
MCH: 31 pg (ref 26.0–34.0)
MCHC: 34.7 g/dL (ref 30.0–36.0)
MCV: 89.3 fL (ref 80.0–100.0)
Platelets: 197 10*3/uL (ref 150–400)
RBC: 3.84 MIL/uL — ABNORMAL LOW (ref 3.87–5.11)
RDW: 12.5 % (ref 11.5–15.5)
WBC: 10.2 10*3/uL (ref 4.0–10.5)
nRBC: 0 % (ref 0.0–0.2)

## 2024-01-30 LAB — MAGNESIUM: Magnesium: 2.2 mg/dL (ref 1.7–2.4)

## 2024-01-30 MED ORDER — METOCLOPRAMIDE HCL 10 MG PO TABS
10.0000 mg | ORAL_TABLET | Freq: Three times a day (TID) | ORAL | Status: DC
  Administered 2024-01-30: 10 mg via ORAL
  Filled 2024-01-30: qty 1

## 2024-01-30 MED ORDER — PANTOPRAZOLE SODIUM 40 MG PO TBEC
40.0000 mg | DELAYED_RELEASE_TABLET | Freq: Every day | ORAL | Status: DC
  Administered 2024-01-30: 40 mg via ORAL
  Filled 2024-01-30: qty 1

## 2024-01-30 MED ORDER — PANTOPRAZOLE SODIUM 40 MG PO TBEC: 40.0000 mg | DELAYED_RELEASE_TABLET | Freq: Every day | ORAL | 0 refills | Status: AC

## 2024-01-30 MED ORDER — METOCLOPRAMIDE HCL 10 MG PO TABS: 10.0000 mg | ORAL_TABLET | Freq: Three times a day (TID) | ORAL | 1 refills | Status: AC

## 2024-01-30 MED ORDER — DOXYLAMINE SUCCINATE (SLEEP) 25 MG PO TABS
25.0000 mg | ORAL_TABLET | Freq: Every day | ORAL | 0 refills | Status: AC
Start: 2024-01-30 — End: ?

## 2024-01-30 MED ORDER — POTASSIUM CHLORIDE CRYS ER 20 MEQ PO TBCR: 40.0000 meq | EXTENDED_RELEASE_TABLET | Freq: Every day | ORAL | Status: DC

## 2024-01-30 MED ORDER — POTASSIUM CHLORIDE CRYS ER 20 MEQ PO TBCR
40.0000 meq | EXTENDED_RELEASE_TABLET | Freq: Two times a day (BID) | ORAL | Status: DC
  Administered 2024-01-30: 40 meq via ORAL
  Filled 2024-01-30: qty 2

## 2024-01-30 MED ORDER — PYRIDOXINE HCL 25 MG PO TABS
25.0000 mg | ORAL_TABLET | Freq: Every day | ORAL | 0 refills | Status: AC
Start: 2024-01-30 — End: ?

## 2024-01-30 MED ORDER — PYRIDOXINE HCL 25 MG PO TABS
25.0000 mg | ORAL_TABLET | Freq: Every day | ORAL | Status: DC
  Filled 2024-01-30: qty 1

## 2024-01-30 MED ORDER — DOXYLAMINE SUCCINATE (SLEEP) 25 MG PO TABS
25.0000 mg | ORAL_TABLET | Freq: Every day | ORAL | Status: DC
  Filled 2024-01-30: qty 1

## 2024-01-30 NOTE — Progress Notes (Signed)
 Patient discharged home with her brother. Discharge instructions, when to follow up, and prescriptions reviewed with patient.  Patient verbalized understanding. Patient will be escorted out by brother, refused wheelchair. Erlene Hawks, RN 01/30/24 @1825 

## 2024-01-30 NOTE — Discharge Summary (Signed)
 Patient ID: Tara Gonzalez MRN: [id number] DOB/AGE: 30-Sep-1995 28 y.o.  Admit date: 01/29/2024 Discharge date: 01/30/2024  Admission Diagnoses: n/v in pregnancy, hypokalemia  Discharge Diagnoses: hyperemesis gravidarum, 9 weeks pregnancy, unwanted pregnancy  Factors complicating pregnancy: Unwanted pregnancy Hyperemesis gravidarum Anxiety and depression Hypokalemia Bipolar pregnancy  Significant Diagnostic Studies:  Results for orders placed or performed during the hospital encounter of 01/29/24 (from the past week)  CBC with Differential   Collection Time: 01/29/24  7:40 AM  Result Value Ref Range   WBC 13.5 (H) 4.0 - 10.5 K/uL   RBC 4.82 3.87 - 5.11 MIL/uL   Hemoglobin 14.4 12.0 - 15.0 g/dL   HCT 40.9 81.1 - 91.4 %   MCV 88.4 80.0 - 100.0 fL   MCH 29.9 26.0 - 34.0 pg   MCHC 33.8 30.0 - 36.0 g/dL   RDW 78.2 95.6 - 21.3 %   Platelets 241 150 - 400 K/uL   nRBC 0.0 0.0 - 0.2 %   Neutrophils Relative % 72 %   Neutro Abs 9.7 (H) 1.7 - 7.7 K/uL   Lymphocytes Relative 16 %   Lymphs Abs 2.2 0.7 - 4.0 K/uL   Monocytes Relative 10 %   Monocytes Absolute 1.3 (H) 0.1 - 1.0 K/uL   Eosinophils Relative 1 %   Eosinophils Absolute 0.1 0.0 - 0.5 K/uL   Basophils Relative 0 %   Basophils Absolute 0.0 0.0 - 0.1 K/uL   Immature Granulocytes 1 %   Abs Immature Granulocytes 0.14 (H) 0.00 - 0.07 K/uL  Comprehensive metabolic panel   Collection Time: 01/29/24  7:40 AM  Result Value Ref Range   Sodium 136 135 - 145 mmol/L   Potassium 2.9 (L) 3.5 - 5.1 mmol/L   Chloride 97 (L) 98 - 111 mmol/L   CO2 26 22 - 32 mmol/L   Glucose, Bld 113 (H) 70 - 99 mg/dL   BUN 13 6 - 20 mg/dL   Creatinine, Ser 0.86 0.44 - 1.00 mg/dL   Calcium  9.2 8.9 - 10.3 mg/dL   Total Protein 7.6 6.5 - 8.1 g/dL   Albumin 4.3 3.5 - 5.0 g/dL   AST 40 15 - 41 U/L   ALT 87 (H) 0 - 44 U/L   Alkaline Phosphatase 48 38 - 126 U/L   Total Bilirubin 0.9 0.0 - 1.2 mg/dL   GFR, Estimated >57 >84 mL/min   Anion gap 13 5 - 15   Lipase, blood   Collection Time: 01/29/24  7:40 AM  Result Value Ref Range   Lipase 27 11 - 51 U/L  Magnesium    Collection Time: 01/29/24  7:40 AM  Result Value Ref Range   Magnesium  2.1 1.7 - 2.4 mg/dL  TSH   Collection Time: 01/29/24  7:40 AM  Result Value Ref Range   TSH 0.038 (L) 0.350 - 4.500 uIU/mL  T4, free   Collection Time: 01/29/24  7:40 AM  Result Value Ref Range   Free T4 1.15 (H) 0.61 - 1.12 ng/dL  Urinalysis, Routine w reflex microscopic -Urine, Clean Catch   Collection Time: 01/29/24 11:16 AM  Result Value Ref Range   Color, Urine YELLOW YELLOW   APPearance HAZY (A) CLEAR   Specific Gravity, Urine 1.019 1.005 - 1.030   pH 8.0 5.0 - 8.0   Glucose, UA NEGATIVE NEGATIVE mg/dL   Hgb urine dipstick NEGATIVE NEGATIVE   Bilirubin Urine NEGATIVE NEGATIVE   Ketones, ur NEGATIVE NEGATIVE mg/dL   Protein, ur NEGATIVE NEGATIVE mg/dL   Nitrite NEGATIVE  NEGATIVE   Leukocytes,Ua NEGATIVE NEGATIVE  CBC   Collection Time: 01/30/24  8:33 AM  Result Value Ref Range   WBC 10.2 4.0 - 10.5 K/uL   RBC 3.84 (L) 3.87 - 5.11 MIL/uL   Hemoglobin 11.9 (L) 12.0 - 15.0 g/dL   HCT 16.1 (L) 09.6 - 04.5 %   MCV 89.3 80.0 - 100.0 fL   MCH 31.0 26.0 - 34.0 pg   MCHC 34.7 30.0 - 36.0 g/dL   RDW 40.9 81.1 - 91.4 %   Platelets 197 150 - 400 K/uL   nRBC 0.0 0.0 - 0.2 %  Comprehensive metabolic panel   Collection Time: 01/30/24  8:33 AM  Result Value Ref Range   Sodium 136 135 - 145 mmol/L   Potassium 3.6 3.5 - 5.1 mmol/L   Chloride 103 98 - 111 mmol/L   CO2 26 22 - 32 mmol/L   Glucose, Bld 78 70 - 99 mg/dL   BUN 10 6 - 20 mg/dL   Creatinine, Ser 7.82 0.44 - 1.00 mg/dL   Calcium  8.5 (L) 8.9 - 10.3 mg/dL   Total Protein 6.1 (L) 6.5 - 8.1 g/dL   Albumin 3.4 (L) 3.5 - 5.0 g/dL   AST 27 15 - 41 U/L   ALT 64 (H) 0 - 44 U/L   Alkaline Phosphatase 39 38 - 126 U/L   Total Bilirubin 1.2 0.0 - 1.2 mg/dL   GFR, Estimated >95 >62 mL/min   Anion gap 7 5 - 15  Magnesium    Collection Time:  01/30/24  8:33 AM  Result Value Ref Range   Magnesium  2.2 1.7 - 2.4 mg/dL  Comprehensive metabolic panel   Collection Time: 01/30/24  5:06 PM  Result Value Ref Range   Sodium 135 135 - 145 mmol/L   Potassium 3.8 3.5 - 5.1 mmol/L   Chloride 104 98 - 111 mmol/L   CO2 23 22 - 32 mmol/L   Glucose, Bld 98 70 - 99 mg/dL   BUN 10 6 - 20 mg/dL   Creatinine, Ser 1.30 (L) 0.44 - 1.00 mg/dL   Calcium  8.5 (L) 8.9 - 10.3 mg/dL   Total Protein 6.3 (L) 6.5 - 8.1 g/dL   Albumin 3.4 (L) 3.5 - 5.0 g/dL   AST 25 15 - 41 U/L   ALT 61 (H) 0 - 44 U/L   Alkaline Phosphatase 42 38 - 126 U/L   Total Bilirubin 0.6 0.0 - 1.2 mg/dL   GFR, Estimated >86 >57 mL/min   Anion gap 8 5 - 15  Results for orders placed or performed during the hospital encounter of 01/26/24 (from the past week)  CBC with Differential   Collection Time: 01/26/24  1:45 PM  Result Value Ref Range   WBC 15.2 (H) 4.0 - 10.5 K/uL   RBC 4.36 3.87 - 5.11 MIL/uL   Hemoglobin 13.5 12.0 - 15.0 g/dL   HCT 84.6 96.2 - 95.2 %   MCV 88.3 80.0 - 100.0 fL   MCH 31.0 26.0 - 34.0 pg   MCHC 35.1 30.0 - 36.0 g/dL   RDW 84.1 32.4 - 40.1 %   Platelets 231 150 - 400 K/uL   nRBC 0.0 0.0 - 0.2 %   Neutrophils Relative % 83 %   Neutro Abs 12.6 (H) 1.7 - 7.7 K/uL   Lymphocytes Relative 11 %   Lymphs Abs 1.6 0.7 - 4.0 K/uL   Monocytes Relative 6 %   Monocytes Absolute 0.9 0.1 - 1.0 K/uL   Eosinophils  Relative 0 %   Eosinophils Absolute 0.0 0.0 - 0.5 K/uL   Basophils Relative 0 %   Basophils Absolute 0.0 0.0 - 0.1 K/uL   Immature Granulocytes 0 %   Abs Immature Granulocytes 0.06 0.00 - 0.07 K/uL  Comprehensive metabolic panel   Collection Time: 01/26/24  1:45 PM  Result Value Ref Range   Sodium 134 (L) 135 - 145 mmol/L   Potassium 3.2 (L) 3.5 - 5.1 mmol/L   Chloride 100 98 - 111 mmol/L   CO2 22 22 - 32 mmol/L   Glucose, Bld 101 (H) 70 - 99 mg/dL   BUN 10 6 - 20 mg/dL   Creatinine, Ser 4.09 0.44 - 1.00 mg/dL   Calcium  9.3 8.9 - 10.3 mg/dL    Total Protein 7.4 6.5 - 8.1 g/dL   Albumin 4.3 3.5 - 5.0 g/dL   AST 35 15 - 41 U/L   ALT 73 (H) 0 - 44 U/L   Alkaline Phosphatase 47 38 - 126 U/L   Total Bilirubin 0.6 0.0 - 1.2 mg/dL   GFR, Estimated >81 >19 mL/min   Anion gap 12 5 - 15  hCG, quantitative, pregnancy   Collection Time: 01/26/24  1:45 PM  Result Value Ref Range   hCG, Beta Chain, Quant, S 196,426 (H) <5 mIU/mL  Urinalysis, Routine w reflex microscopic -Urine, Clean Catch   Collection Time: 01/26/24  4:33 PM  Result Value Ref Range   Color, Urine YELLOW (A) YELLOW   APPearance TURBID (A) CLEAR   Specific Gravity, Urine 1.020 1.005 - 1.030   pH 9.0 (H) 5.0 - 8.0   Glucose, UA NEGATIVE NEGATIVE mg/dL   Hgb urine dipstick NEGATIVE NEGATIVE   Bilirubin Urine NEGATIVE NEGATIVE   Ketones, ur 20 (A) NEGATIVE mg/dL   Protein, ur 30 (A) NEGATIVE mg/dL   Nitrite NEGATIVE NEGATIVE   Leukocytes,Ua TRACE (A) NEGATIVE   RBC / HPF 0-5 0 - 5 RBC/hpf   WBC, UA 0 0 - 5 WBC/hpf   Bacteria, UA RARE (A) NONE SEEN   Squamous Epithelial / HPF 0-5 0 - 5 /HPF   Mucus PRESENT    Amorphous Crystal PRESENT   Results for orders placed or performed during the hospital encounter of 01/24/24 (from the past week)  Comprehensive metabolic panel   Collection Time: 01/24/24  6:28 AM  Result Value Ref Range   Sodium 136 135 - 145 mmol/L   Potassium 3.2 (L) 3.5 - 5.1 mmol/L   Chloride 106 98 - 111 mmol/L   CO2 22 22 - 32 mmol/L   Glucose, Bld 105 (H) 70 - 99 mg/dL   BUN 7 6 - 20 mg/dL   Creatinine, Ser 1.47 0.44 - 1.00 mg/dL   Calcium  8.7 (L) 8.9 - 10.3 mg/dL   Total Protein 6.8 6.5 - 8.1 g/dL   Albumin 3.8 3.5 - 5.0 g/dL   AST 35 15 - 41 U/L   ALT 36 0 - 44 U/L   Alkaline Phosphatase 40 38 - 126 U/L   Total Bilirubin 0.4 0.0 - 1.2 mg/dL   GFR, Estimated >82 >95 mL/min   Anion gap 8 5 - 15  CBC with Differential   Collection Time: 01/24/24  6:28 AM  Result Value Ref Range   WBC 10.8 (H) 4.0 - 10.5 K/uL   RBC 3.97 3.87 - 5.11 MIL/uL    Hemoglobin 12.0 12.0 - 15.0 g/dL   HCT 62.1 (L) 30.8 - 65.7 %   MCV 90.4 80.0 -  100.0 fL   MCH 30.2 26.0 - 34.0 pg   MCHC 33.4 30.0 - 36.0 g/dL   RDW 16.1 09.6 - 04.5 %   Platelets 198 150 - 400 K/uL   nRBC 0.0 0.0 - 0.2 %   Neutrophils Relative % 84 %   Neutro Abs 9.1 (H) 1.7 - 7.7 K/uL   Lymphocytes Relative 11 %   Lymphs Abs 1.1 0.7 - 4.0 K/uL   Monocytes Relative 4 %   Monocytes Absolute 0.5 0.1 - 1.0 K/uL   Eosinophils Relative 0 %   Eosinophils Absolute 0.0 0.0 - 0.5 K/uL   Basophils Relative 0 %   Basophils Absolute 0.0 0.0 - 0.1 K/uL   Immature Granulocytes 1 %   Abs Immature Granulocytes 0.07 0.00 - 0.07 K/uL  Magnesium    Collection Time: 01/24/24  6:28 AM  Result Value Ref Range   Magnesium  1.9 1.7 - 2.4 mg/dL  Urinalysis, w/ Reflex to Culture (Infection Suspected) -Urine, Clean Catch   Collection Time: 01/24/24 11:02 AM  Result Value Ref Range   Specimen Source URINE, CLEAN CATCH    Color, Urine YELLOW (A) YELLOW   APPearance CLOUDY (A) CLEAR   Specific Gravity, Urine 1.012 1.005 - 1.030   pH 8.0 5.0 - 8.0   Glucose, UA NEGATIVE NEGATIVE mg/dL   Hgb urine dipstick NEGATIVE NEGATIVE   Bilirubin Urine NEGATIVE NEGATIVE   Ketones, ur 5 (A) NEGATIVE mg/dL   Protein, ur NEGATIVE NEGATIVE mg/dL   Nitrite NEGATIVE NEGATIVE   Leukocytes,Ua NEGATIVE NEGATIVE   RBC / HPF 0-5 0 - 5 RBC/hpf   WBC, UA 0-5 0 - 5 WBC/hpf   Bacteria, UA RARE (A) NONE SEEN   Squamous Epithelial / HPF 6-10 0 - 5 /HPF   Mucus PRESENT    Budding Yeast PRESENT    Amorphous Crystal PRESENT   Wet prep, genital   Collection Time: 01/24/24 11:35 AM   Specimen: Vaginal  Result Value Ref Range   Yeast Wet Prep HPF POC NONE SEEN NONE SEEN   Trich, Wet Prep NONE SEEN NONE SEEN   Clue Cells Wet Prep HPF POC PRESENT (A) NONE SEEN   WBC, Wet Prep HPF POC <10 <10   Sperm NONE SEEN   CBC   Collection Time: 01/25/24  7:19 AM  Result Value Ref Range   WBC 11.1 (H) 4.0 - 10.5 K/uL   RBC 3.74 (L)  3.87 - 5.11 MIL/uL   Hemoglobin 11.7 (L) 12.0 - 15.0 g/dL   HCT 40.9 (L) 81.1 - 91.4 %   MCV 89.3 80.0 - 100.0 fL   MCH 31.3 26.0 - 34.0 pg   MCHC 35.0 30.0 - 36.0 g/dL   RDW 78.2 95.6 - 21.3 %   Platelets 187 150 - 400 K/uL   nRBC 0.0 0.0 - 0.2 %  Comprehensive metabolic panel   Collection Time: 01/25/24  7:19 AM  Result Value Ref Range   Sodium 134 (L) 135 - 145 mmol/L   Potassium 3.1 (L) 3.5 - 5.1 mmol/L   Chloride 103 98 - 111 mmol/L   CO2 22 22 - 32 mmol/L   Glucose, Bld 92 70 - 99 mg/dL   BUN <5 (L) 6 - 20 mg/dL   Creatinine, Ser 0.86 (L) 0.44 - 1.00 mg/dL   Calcium  8.6 (L) 8.9 - 10.3 mg/dL   Total Protein 6.4 (L) 6.5 - 8.1 g/dL   Albumin 3.6 3.5 - 5.0 g/dL   AST 25 15 - 41 U/L  ALT 40 0 - 44 U/L   Alkaline Phosphatase 39 38 - 126 U/L   Total Bilirubin 0.8 0.0 - 1.2 mg/dL   GFR, Estimated >16 >10 mL/min   Anion gap 9 5 - 15    Treatments: IV hydration and anti-emetics  Hospital Course:  This is a 28 y.o. [id number] with IUP at [redacted]w[redacted]d admitted for hyperemesis gravidarum and hypokalemia. She received IV hydration and scheduled anti-emetics and experienced a resolution in her nausea and vomiting. She was given 2.5 bags of 10mEq K+ IV then refused any further IV potassium. She took one dose of oral 40mEq potassium. Her potassium improved and her nausea and vomiting resolved. She was able to keep down a regular diet and was requesting discharge. She was stable for discharge and outpatient follow-up.  She is scheduled for a termination of her pregnancy on 02/01/24. She will follow-up in our office after the termination to start OCPs for pregnancy prevention.   Discharge Physical Exam:   BP 109/65 (BP Location: Right Arm)   Pulse 67   Temp 98.1 F (36.7 C) (Oral)   Resp 18   Ht 5\' 1"  (1.549 m)   Wt 58.2 kg   LMP 11/29/2023 (Exact Date)   SpO2 100%   BMI 24.26 kg/m   General: NAD CV: RRR Pulm: CTABL, nl effort ABD: s/nd/nt, gravid DVT Evaluation: LE non-ttp, no  evidence of DVT on exam.   Discharge Condition: Stable  Disposition: Discharge disposition: 01-Home or Self Care        Allergies as of 01/30/2024   No Known Allergies      Medication List     STOP taking these medications    cephALEXin  500 MG capsule Commonly known as: KEFLEX    folic acid 1 MG tablet Commonly known as: FOLVITE   metroNIDAZOLE  0.75 % vaginal gel Commonly known as: METROGEL    potassium chloride  SA 20 MEQ tablet Commonly known as: KLOR-CON  M   thiamine 100 MG tablet Commonly known as: Vitamin B-1       TAKE these medications    acetaminophen  500 MG tablet Commonly known as: TYLENOL  Take 2 tablets (1,000 mg total) by mouth every 6 (six) hours as needed.   doxylamine (Sleep) 25 MG tablet Commonly known as: UNISOM Take 1 tablet (25 mg total) by mouth at bedtime.   famotidine  20 MG tablet Commonly known as: PEPCID  Take 1 tablet (20 mg total) by mouth 2 (two) times daily.   metoCLOPramide  10 MG tablet Commonly known as: REGLAN  Take 1 tablet (10 mg total) by mouth 4 (four) times daily -  before meals and at bedtime.   ondansetron  4 MG disintegrating tablet Commonly known as: ZOFRAN -ODT Take 1 tablet (4 mg total) by mouth every 8 (eight) hours as needed for nausea or vomiting.   pantoprazole  40 MG tablet Commonly known as: PROTONIX  Take 1 tablet (40 mg total) by mouth daily before breakfast. Start taking on: Jan 31, 2024   promethazine  25 MG tablet Commonly known as: PHENERGAN  Take 1 tablet (25 mg total) by mouth every 6 (six) hours as needed for nausea or vomiting.   pyridOXINE 25 MG tablet Commonly known as: VITAMIN B6 Take 1 tablet (25 mg total) by mouth at bedtime.         Signed:  Auston Left 01/30/2024 6:05 PM ----- Lavanda Porter, CNM Certified Nurse Midwife [facility]

## 2024-01-30 NOTE — Progress Notes (Signed)
 ANTEPARTUM PROGRESS NOTE  Tara Gonzalez is a 28 y.o. G3P2002 at [redacted]w[redacted]d who is admitted for hyperemesis gravidarum. This is her second admission. Last admission 01/24/24 - 01/25/24  Length of Stay:  1 Days. Admitted 01/29/2024  Subjective:  She reports she is feeling better today, has not vomited since yesterday around noon. She denies any abdominal pain and is hungry, requesting regular diet.  Vitals:  Blood pressure 128/79, pulse 64, temperature 98.7 F (37.1 C), temperature source Oral, resp. rate 18, height 5\' 1"  (1.549 m), weight 58.2 kg, last menstrual period 11/29/2023, SpO2 100%. Physical Examination: CONSTITUTIONAL: Well-developed, well-nourished female in no acute distress.  HENT:  Normocephalic, atraumatic, External right and left ear normal. Oropharynx is clear and moist SKIN: Skin is warm and dry. No rash noted. Not diaphoretic. No erythema. No pallor. NEUROLGIC: Alert and oriented to person, place, and time. Normal reflexes, muscle tone coordination. No cranial nerve deficit noted. PSYCHIATRIC: Normal mood and affect. Normal behavior. Normal judgment and thought content. CARDIOVASCULAR: Normal heart rate noted, regular rhythm RESPIRATORY: Effort and breath sounds normal, no problems with respiration noted MUSCULOSKELETAL: Normal range of motion. No edema and no tenderness. 2+ distal pulses. ABDOMEN: Soft, nontender, nondistended, gravid.  Results for orders placed or performed during the hospital encounter of 01/29/24 (from the past 48 hours)  CBC with Differential     Status: Abnormal   Collection Time: 01/29/24  7:40 AM  Result Value Ref Range   WBC 13.5 (H) 4.0 - 10.5 K/uL   RBC 4.82 3.87 - 5.11 MIL/uL   Hemoglobin 14.4 12.0 - 15.0 g/dL   HCT 13.0 86.5 - 78.4 %   MCV 88.4 80.0 - 100.0 fL   MCH 29.9 26.0 - 34.0 pg   MCHC 33.8 30.0 - 36.0 g/dL   RDW 69.6 29.5 - 28.4 %   Platelets 241 150 - 400 K/uL   nRBC 0.0 0.0 - 0.2 %   Neutrophils Relative % 72 %   Neutro Abs 9.7  (H) 1.7 - 7.7 K/uL   Lymphocytes Relative 16 %   Lymphs Abs 2.2 0.7 - 4.0 K/uL   Monocytes Relative 10 %   Monocytes Absolute 1.3 (H) 0.1 - 1.0 K/uL   Eosinophils Relative 1 %   Eosinophils Absolute 0.1 0.0 - 0.5 K/uL   Basophils Relative 0 %   Basophils Absolute 0.0 0.0 - 0.1 K/uL   Immature Granulocytes 1 %   Abs Immature Granulocytes 0.14 (H) 0.00 - 0.07 K/uL    Comment: Performed at [facility], [address] [place], [place] [address]  Comprehensive metabolic panel     Status: Abnormal   Collection Time: 01/29/24  7:40 AM  Result Value Ref Range   Sodium 136 135 - 145 mmol/L   Potassium 2.9 (L) 3.5 - 5.1 mmol/L   Chloride 97 (L) 98 - 111 mmol/L   CO2 26 22 - 32 mmol/L   Glucose, Bld 113 (H) 70 - 99 mg/dL    Comment: Glucose reference range applies only to samples taken after fasting for at least 8 hours.   BUN 13 6 - 20 mg/dL   Creatinine, Ser 0.10 0.44 - 1.00 mg/dL   Calcium  9.2 8.9 - 10.3 mg/dL   Total Protein 7.6 6.5 - 8.1 g/dL   Albumin 4.3 3.5 - 5.0 g/dL   AST 40 15 - 41 U/L   ALT 87 (H) 0 - 44 U/L   Alkaline Phosphatase 48 38 - 126 U/L   Total Bilirubin 0.9  0.0 - 1.2 mg/dL   GFR, Estimated >24 >40 mL/min    Comment: (NOTE) Calculated using the CKD-EPI Creatinine Equation (2021)    Anion gap 13 5 - 15    Comment: Performed at [facility], [address] [place], [place] [address]  Lipase, blood     Status: None   Collection Time: 01/29/24  7:40 AM  Result Value Ref Range   Lipase 27 11 - 51 U/L    Comment: Performed at [facility], [address] [place], [place] [address]  Magnesium      Status: None   Collection Time: 01/29/24  7:40 AM  Result Value Ref Range   Magnesium  2.1 1.7 - 2.4 mg/dL    Comment: Performed at [facility], [address] [place], [place] [address]  TSH     Status: Abnormal   Collection Time: 01/29/24  7:40 AM  Result Value Ref Range   TSH 0.038 (L) 0.350 - 4.500 uIU/mL    Comment:  Performed by a 3rd Generation assay with a functional sensitivity of <=0.01 uIU/mL. Performed at [facility], [address] [place], [place] [address]   T4, free     Status: Abnormal   Collection Time: 01/29/24  7:40 AM  Result Value Ref Range   Free T4 1.15 (H) 0.61 - 1.12 ng/dL    Comment: (NOTE) Biotin ingestion may interfere with free T4 tests. If the results are inconsistent with the TSH level, previous test results, or the clinical presentation, then consider biotin interference. If needed, order repeat testing after stopping biotin. Performed at [facility], [address] [place], [place] [address]   Urinalysis, Routine w reflex microscopic -Urine, Clean Catch     Status: Abnormal   Collection Time: 01/29/24 11:16 AM  Result Value Ref Range   Color, Urine YELLOW YELLOW   APPearance HAZY (A) CLEAR   Specific Gravity, Urine 1.019 1.005 - 1.030   pH 8.0 5.0 - 8.0   Glucose, UA NEGATIVE NEGATIVE mg/dL   Hgb urine dipstick NEGATIVE NEGATIVE   Bilirubin Urine NEGATIVE NEGATIVE   Ketones, ur NEGATIVE NEGATIVE mg/dL   Protein, ur NEGATIVE NEGATIVE mg/dL   Nitrite NEGATIVE NEGATIVE   Leukocytes,Ua NEGATIVE NEGATIVE    Comment: Microscopic not done on urines with negative protein, blood, leukocytes, nitrite, or glucose < 500 mg/dL. Performed at [facility], [address] [place], [place] [address]   CBC     Status: Abnormal   Collection Time: 01/30/24  8:33 AM  Result Value Ref Range   WBC 10.2 4.0 - 10.5 K/uL   RBC 3.84 (L) 3.87 - 5.11 MIL/uL   Hemoglobin 11.9 (L) 12.0 - 15.0 g/dL   HCT 95.1 (L) 88.4 - 16.6 %   MCV 89.3 80.0 - 100.0 fL   MCH 31.0 26.0 - 34.0 pg   MCHC 34.7 30.0 - 36.0 g/dL   RDW 06.3 01.6 - 01.0 %   Platelets 197 150 - 400 K/uL   nRBC 0.0 0.0 - 0.2 %    Comment: Performed at [facility], [address], [place], [place] [address]  Comprehensive metabolic panel     Status: Abnormal   Collection Time:  01/30/24  8:33 AM  Result Value Ref Range   Sodium 136 135 - 145 mmol/L   Potassium 3.6 3.5 - 5.1 mmol/L   Chloride 103 98 - 111 mmol/L   CO2 26 22 - 32 mmol/L   Glucose, Bld 78 70 -  99 mg/dL    Comment: Glucose reference range applies only to samples taken after fasting for at least 8 hours.   BUN 10 6 - 20 mg/dL   Creatinine, Ser 1.61 0.44 - 1.00 mg/dL   Calcium  8.5 (L) 8.9 - 10.3 mg/dL   Total Protein 6.1 (L) 6.5 - 8.1 g/dL   Albumin 3.4 (L) 3.5 - 5.0 g/dL   AST 27 15 - 41 U/L   ALT 64 (H) 0 - 44 U/L   Alkaline Phosphatase 39 38 - 126 U/L   Total Bilirubin 1.2 0.0 - 1.2 mg/dL   GFR, Estimated >09 >60 mL/min    Comment: (NOTE) Calculated using the CKD-EPI Creatinine Equation (2021)    Anion gap 7 5 - 15    Comment: Performed at [facility], [address] [place], [place] [address]    No results found.  Current scheduled medications  metoCLOPramide  (REGLAN ) injection  10 mg Intravenous Q6H   [START ON 01/31/2024] potassium chloride   40 mEq Oral Daily    I have reviewed the patient's current medications.  ASSESSMENT: Patient Active Problem List   Diagnosis Date Noted   Hyperemesis 01/29/2024   Hyperemesis gravidarum 01/24/2024   GI bleeding 10/18/2023   Anxiety and depression 10/18/2023   Influenza A 10/18/2023   Acute gastroenteritis 10/17/2023   Exposure to STD 09/30/2020   Acute cholecystitis 06/25/2020   Intractable vomiting 05/20/2017   Grief reaction 09/11/2015   Hypokalemia 08/01/2015   Lower abdominal pain 01/21/2013   Bipolar disorder (HCC) 05/02/2012    PLAN:  1. Hyperemesis - hyperemesis protocol initiated  - advance diet as tolerated  - Discontinue IV fluids and initiate oral hydration - Scheduled IV antiemetics changed to scheduled oral antiemetics, with PRN IV/PO/PR antiemetics for breakthrough N/V  - Daily weight  - Q4 hour intake/output    2. Routine antenatal care  - Activity as tolerated  - Bathroom privileges  - OB US   completed   3. Hypokalemia - K+ 2.9 upon admission, received 2.5 bags of 10mEq K+ IVF, patient refused any further IV K+ replacement. She accepted oral K+ 40mEq. - K+ came up to 3.6 this morning, recheck CMP in 8hrs    4. Unwanted pregnancy - she is aware of [place] abortion laws and has an appointment for termination in 2 days  Auston Left, [facility] 01/30/2024 10:43 AM

## 2024-02-12 ENCOUNTER — Other Ambulatory Visit: Payer: Self-pay

## 2024-02-12 ENCOUNTER — Emergency Department

## 2024-02-12 ENCOUNTER — Observation Stay
Admission: EM | Admit: 2024-02-12 | Discharge: 2024-02-13 | Disposition: A | Discharge status: Home / Self Care | Attending: Obstetrics | Admitting: Obstetrics

## 2024-02-12 DIAGNOSIS — O26899 Other specified pregnancy related conditions, unspecified trimester: Secondary | ICD-10-CM | POA: Diagnosis not present

## 2024-02-12 DIAGNOSIS — E876 Hypokalemia: Secondary | ICD-10-CM | POA: Diagnosis not present

## 2024-02-12 DIAGNOSIS — Z3A01 Less than 8 weeks gestation of pregnancy: Secondary | ICD-10-CM | POA: Diagnosis not present

## 2024-02-12 DIAGNOSIS — O210 Mild hyperemesis gravidarum: Principal | ICD-10-CM | POA: Diagnosis present

## 2024-02-12 DIAGNOSIS — F32A Depression, unspecified: Secondary | ICD-10-CM | POA: Insufficient documentation

## 2024-02-12 DIAGNOSIS — Z640 Problems related to unwanted pregnancy: Secondary | ICD-10-CM | POA: Diagnosis not present

## 2024-02-12 DIAGNOSIS — F419 Anxiety disorder, unspecified: Secondary | ICD-10-CM | POA: Diagnosis not present

## 2024-02-12 DIAGNOSIS — Z3A11 11 weeks gestation of pregnancy: Secondary | ICD-10-CM | POA: Insufficient documentation

## 2024-02-12 DIAGNOSIS — O99341 Other mental disorders complicating pregnancy, first trimester: Secondary | ICD-10-CM | POA: Diagnosis not present

## 2024-02-12 DIAGNOSIS — O99281 Endocrine, nutritional and metabolic diseases complicating pregnancy, first trimester: Secondary | ICD-10-CM | POA: Insufficient documentation

## 2024-02-12 DIAGNOSIS — R1111 Vomiting without nausea: Secondary | ICD-10-CM | POA: Diagnosis not present

## 2024-02-12 LAB — CBC WITH DIFFERENTIAL/PLATELET
Abs Immature Granulocytes: 0.1 10*3/uL — ABNORMAL HIGH (ref 0.00–0.07)
Basophils Absolute: 0 10*3/uL (ref 0.0–0.1)
Basophils Relative: 0 %
Eosinophils Absolute: 0 10*3/uL (ref 0.0–0.5)
Eosinophils Relative: 0 %
HCT: 39 % (ref 36.0–46.0)
Hemoglobin: 13.3 g/dL (ref 12.0–15.0)
Immature Granulocytes: 1 %
Lymphocytes Relative: 5 %
Lymphs Abs: 0.9 10*3/uL (ref 0.7–4.0)
MCH: 31 pg (ref 26.0–34.0)
MCHC: 34.1 g/dL (ref 30.0–36.0)
MCV: 90.9 fL (ref 80.0–100.0)
Monocytes Absolute: 0.6 10*3/uL (ref 0.1–1.0)
Monocytes Relative: 4 %
Neutro Abs: 14.4 10*3/uL — ABNORMAL HIGH (ref 1.7–7.7)
Neutrophils Relative %: 90 %
Platelets: 260 10*3/uL (ref 150–400)
RBC: 4.29 MIL/uL (ref 3.87–5.11)
RDW: 13.4 % (ref 11.5–15.5)
WBC: 15.9 10*3/uL — ABNORMAL HIGH (ref 4.0–10.5)
nRBC: 0 % (ref 0.0–0.2)

## 2024-02-12 LAB — COMPREHENSIVE METABOLIC PANEL WITH GFR
ALT: 162 U/L — ABNORMAL HIGH (ref 0–44)
AST: 53 U/L — ABNORMAL HIGH (ref 15–41)
Albumin: 3.9 g/dL (ref 3.5–5.0)
Alkaline Phosphatase: 50 U/L (ref 38–126)
Anion gap: 12 (ref 5–15)
BUN: 16 mg/dL (ref 6–20)
CO2: 20 mmol/L — ABNORMAL LOW (ref 22–32)
Calcium: 8.9 mg/dL (ref 8.9–10.3)
Chloride: 105 mmol/L (ref 98–111)
Creatinine, Ser: 0.43 mg/dL — ABNORMAL LOW (ref 0.44–1.00)
GFR, Estimated: >60 mL/min (ref >60)
Glucose, Bld: 102 mg/dL — ABNORMAL HIGH (ref 70–99)
Potassium: 3.6 mmol/L (ref 3.5–5.1)
Sodium: 137 mmol/L (ref 135–145)
Total Bilirubin: 0.7 mg/dL (ref 0.0–1.2)
Total Protein: 7.7 g/dL (ref 6.5–8.1)

## 2024-02-12 LAB — HCG, QUANTITATIVE, PREGNANCY: hCG, Beta Chain, Quant, S: 204399 m[IU]/mL — ABNORMAL HIGH (ref <5)

## 2024-02-12 MED ORDER — SODIUM CHLORIDE 0.9 % IV SOLN
12.5000 mg | Freq: Once | INTRAVENOUS | Status: AC
  Administered 2024-02-12: 12.5 mg via INTRAVENOUS
  Filled 2024-02-12: qty 0.5

## 2024-02-12 MED ORDER — DEXTROSE IN LACTATED RINGERS 5 % IV SOLN
1000.0000 mL | Freq: Once | INTRAVENOUS | Status: AC
  Administered 2024-02-12: 1000 mL via INTRAVENOUS

## 2024-02-12 MED ORDER — DEXTROSE 5 % IN LACTATED RINGERS IV BOLUS
1000.0000 mL | Freq: Once | INTRAVENOUS | Status: AC
  Administered 2024-02-12: 1000 mL via INTRAVENOUS
  Filled 2024-02-12: qty 1000

## 2024-02-12 MED ORDER — METOCLOPRAMIDE HCL 5 MG/ML IJ SOLN
5.0000 mg | Freq: Once | INTRAMUSCULAR | Status: AC
  Administered 2024-02-12: 5 mg via INTRAVENOUS
  Filled 2024-02-12: qty 2

## 2024-02-12 NOTE — ED Triage Notes (Signed)
 BIB [facility] from home with CC of emesis x2 days - [redacted] weeks pregnant. LMP 3/26. EDD in December. 18G in L AC and gave 500mL NS. Seen last week for same. Hx of hyperemesis with pregnancies.

## 2024-02-12 NOTE — ED Provider Notes (Signed)
 [facility] Provider Note    Event Date/Time   First MD Initiated Contact with Patient 02/12/24 2020     (approximate)   History   Emesis   HPI Tara Gonzalez is a 28 y.o. female with history of hyperemesis gravidarum and currently approximately 11 to [redacted] weeks pregnant presenting today for nausea with vomiting.  Patient states she had onset of symptoms yesterday but felt similar to prior episodes.  She is also having lower abdominal pain and low back pain which started after the vomiting.  She denies dysuria, vaginal bleeding, diarrhea, constipation.  Chart review: Patient has had multiple recent visits to the ED including admissions for hyperemesis gravidarum.  She is scheduled reportedly in the upcoming week for termination of her pregnancy     Physical Exam   Triage Vital Signs: ED Triage Vitals [02/12/24 1920]  Encounter Vitals Group     BP (!) 132/90     Systolic BP Percentile      Diastolic BP Percentile      Pulse Rate 94     Resp 19     Temp 98.3 F (36.8 C)     Temp Source Oral     SpO2 98 %     Weight 128 lb 6.4 oz (58.2 kg)     Height 5\' 1"  (1.549 m)     Head Circumference      Peak Flow      Pain Score 8     Pain Loc      Pain Education      Exclude from Growth Chart     Most recent vital signs: Vitals:   02/12/24 1920  BP: (!) 132/90  Pulse: 94  Resp: 19  Temp: 98.3 F (36.8 C)  SpO2: 98%   Physical Exam: I have reviewed the vital signs and nursing notes. General: Awake, alert, no acute distress.  Nontoxic appearing. Head:  Atraumatic, normocephalic.   ENT:  EOM intact, PERRL. Oral mucosa is pink and moist with no lesions. Neck: Neck is supple with full range of motion, No meningeal signs. Cardiovascular:  RRR, No murmurs. Peripheral pulses palpable and equal bilaterally. Respiratory:  Symmetrical chest wall expansion.  No rhonchi, rales, or wheezes.  Good air movement throughout.  No use of accessory muscles.    Musculoskeletal:  No cyanosis or edema. Moving extremities with full ROM Abdomen:  Soft, generalized lower abdominal pain and low back pain, nondistended. Neuro:  GCS 15, moving all four extremities, interacting appropriately. Speech clear. Psych:  Calm, appropriate.   Skin:  Warm, dry, no rash.    ED Results / Procedures / Treatments   Labs (all labs ordered are listed, but only abnormal results are displayed) Labs Reviewed  CBC WITH DIFFERENTIAL/PLATELET - Abnormal; Notable for the following components:      Result Value   WBC 15.9 (*)    Neutro Abs 14.4 (*)    Abs Immature Granulocytes 0.10 (*)    All other components within normal limits  COMPREHENSIVE METABOLIC PANEL WITH GFR - Abnormal; Notable for the following components:   CO2 20 (*)    Glucose, Bld 102 (*)    Creatinine, Ser 0.43 (*)    AST 53 (*)    ALT 162 (*)    All other components within normal limits  HCG, QUANTITATIVE, PREGNANCY - Abnormal; Notable for the following components:   hCG, Beta Chain, Quant, S 204,399 (*)    All other components within normal limits  URINALYSIS, ROUTINE W REFLEX MICROSCOPIC     EKG    RADIOLOGY Independently interpreted ultrasound with no acute pathology   PROCEDURES:  Critical Care performed: No  Procedures   MEDICATIONS ORDERED IN ED: Medications  dextrose  5% lactated ringers  bolus 1,000 mL (0 mLs Intravenous Stopped 02/12/24 2224)  promethazine  (PHENERGAN ) 12.5 mg in sodium chloride  0.9 % 50 mL IVPB (0 mg Intravenous Stopped 02/12/24 2224)  dextrose  5 % in lactated ringers  infusion (1,000 mLs Intravenous New Bag/Given 02/12/24 2259)  metoCLOPramide  (REGLAN ) injection 5 mg (5 mg Intravenous Given 02/12/24 2300)     IMPRESSION / MDM / ASSESSMENT AND PLAN / ED COURSE  I reviewed the triage vital signs and the nursing notes.                              Differential diagnosis includes, but is not limited to, hyperemesis gravidarum, dehydration, electrolyte  abnormality, miscarriage, lower concern for subchorionic hemorrhage or other acute ovarian pathology  Patient's presentation is most consistent with acute complicated illness / injury requiring diagnostic workup.  Patient is a 28 year old female presenting today for nausea and vomiting in the setting of pregnancy.  Multiple prior ED visits for the same including 2 admissions to the [facility] service for ongoing hyperemesis gravidarum.  Generalized pain to the lower abdomen and lower back.  CBC with slight leukocytosis although similar to prior baseline.  Does have evidence of dehydration with hypocarbia.  Will give D5 LR as well as Phenergan .  Given that has been almost 2 weeks since last visit, will get repeat ultrasound to evaluate the pregnancy.  Ultrasound negative for acute pathology.  Patient was given repeat fluids and Reglan  with ongoing symptoms.  Unable to tolerate p.o.  Will admit to [facility] team for hyperemesis gravidarum.  Clinical Course as of 02/13/24 0002  Mon Feb 12, 2024  2232 Having ongoing nausea with vomiting.  Will give additional fluids and nausea medication [DW]  2238 US  OB Comp Less 14 Wks No acute findings [DW]  2240 Unable to provide urine sample due to her dehydration and ongoing N/V at this time [DW]  2330 Still ongoing, nausea with vomiting. Will discuss with CNM with OB for admission for hyperemesis gravidarum [DW]  2335 Spoke with Collin Deal, midwife on for [facility]. She will accept the patient for admission for hyperemesis gravidarum [DW]    Clinical Course User Index [DW] Kandee Orion, MD     FINAL CLINICAL IMPRESSION(S) / ED DIAGNOSES   Final diagnoses:  Hyperemesis gravidarum     Rx / DC Orders   ED Discharge Orders     None        Note:  This document was prepared using Dragon voice recognition software and may include unintentional dictation errors.   Kandee Orion, MD 02/12/24 [phone]

## 2024-02-13 DIAGNOSIS — O210 Mild hyperemesis gravidarum: Secondary | ICD-10-CM | POA: Diagnosis not present

## 2024-02-13 DIAGNOSIS — Z3A11 11 weeks gestation of pregnancy: Secondary | ICD-10-CM | POA: Diagnosis not present

## 2024-02-13 DIAGNOSIS — Z640 Problems related to unwanted pregnancy: Secondary | ICD-10-CM | POA: Diagnosis not present

## 2024-02-13 DIAGNOSIS — O0931 Supervision of pregnancy with insufficient antenatal care, first trimester: Secondary | ICD-10-CM | POA: Diagnosis not present

## 2024-02-13 DIAGNOSIS — Z5329 Procedure and treatment not carried out because of patient's decision for other reasons: Secondary | ICD-10-CM | POA: Diagnosis not present

## 2024-02-13 LAB — URINALYSIS, ROUTINE W REFLEX MICROSCOPIC
Bacteria, UA: NONE SEEN
Bilirubin Urine: NEGATIVE
Glucose, UA: >=500 mg/dL — AB
Hgb urine dipstick: NEGATIVE
Ketones, ur: 20 mg/dL — AB
Leukocytes,Ua: NEGATIVE
Nitrite: NEGATIVE
Protein, ur: NEGATIVE mg/dL
Specific Gravity, Urine: 1.03 (ref 1.005–1.030)
pH: 6 (ref 5.0–8.0)

## 2024-02-13 LAB — TSH: TSH: 0.184 u[IU]/mL — ABNORMAL LOW (ref 0.350–4.500)

## 2024-02-13 LAB — LIPASE, BLOOD: Lipase: 30 U/L (ref 11–51)

## 2024-02-13 LAB — MAGNESIUM: Magnesium: 1.7 mg/dL (ref 1.7–2.4)

## 2024-02-13 LAB — T4, FREE: Free T4: 0.71 ng/dL (ref 0.61–1.12)

## 2024-02-13 MED ORDER — THIAMINE HCL 100 MG/ML IJ SOLN
Freq: Once | INTRAVENOUS | Status: AC
  Filled 2024-02-13: qty 1000

## 2024-02-13 MED ORDER — FAMOTIDINE IN NACL 20-0.9 MG/50ML-% IV SOLN
20.0000 mg | Freq: Two times a day (BID) | INTRAVENOUS | Status: DC
  Administered 2024-02-13 (×2): 20 mg via INTRAVENOUS
  Filled 2024-02-13 (×3): qty 50

## 2024-02-13 MED ORDER — ACETAMINOPHEN 500 MG PO TABS: 1000.0000 mg | ORAL_TABLET | Freq: Four times a day (QID) | ORAL | Status: DC | PRN

## 2024-02-13 MED ORDER — PROCHLORPERAZINE EDISYLATE 10 MG/2ML IJ SOLN
10.0000 mg | Freq: Four times a day (QID) | INTRAMUSCULAR | Status: DC
  Administered 2024-02-13 (×3): 10 mg via INTRAVENOUS
  Filled 2024-02-13 (×5): qty 2

## 2024-02-13 MED ORDER — METOCLOPRAMIDE HCL 5 MG/ML IJ SOLN: 10.0000 mg | Freq: Four times a day (QID) | INTRAMUSCULAR | Status: DC

## 2024-02-13 MED ORDER — ONDANSETRON HCL 4 MG/2ML IJ SOLN
4.0000 mg | Freq: Three times a day (TID) | INTRAMUSCULAR | Status: DC | PRN
  Administered 2024-02-13: 4 mg via INTRAVENOUS
  Filled 2024-02-13: qty 2

## 2024-02-13 MED ORDER — METOCLOPRAMIDE HCL 5 MG/ML IJ SOLN
10.0000 mg | Freq: Four times a day (QID) | INTRAMUSCULAR | Status: DC
  Administered 2024-02-13 (×2): 10 mg via INTRAVENOUS
  Filled 2024-02-13 (×2): qty 2

## 2024-02-13 NOTE — Discharge Summary (Signed)
 Patient ID: Tara Gonzalez MRN: [id number] DOB/AGE: 05/04/1996 28 y.o.  Admit date: 02/12/2024 Discharge date: 02/13/2024  Admission Diagnoses: hyperemesis   Discharge Diagnoses: hyperemesis with undesired pregnancy  Factors complicating pregnancy: Unwanted pregnancy Hyperemesis gravidarum Anxiety and depression Hypokalemia Bipolar disorder  Prenatal Procedures: ultrasound and labs  Consults: None  Significant Diagnostic Studies:  Results for orders placed or performed during the hospital encounter of 02/12/24 (from the past week)  CBC with Differential   Collection Time: 02/12/24  7:25 PM  Result Value Ref Range   WBC 15.9 (H) 4.0 - 10.5 K/uL   RBC 4.29 3.87 - 5.11 MIL/uL   Hemoglobin 13.3 12.0 - 15.0 g/dL   HCT 40.9 81.1 - 91.4 %   MCV 90.9 80.0 - 100.0 fL   MCH 31.0 26.0 - 34.0 pg   MCHC 34.1 30.0 - 36.0 g/dL   RDW 78.2 95.6 - 21.3 %   Platelets 260 150 - 400 K/uL   nRBC 0.0 0.0 - 0.2 %   Neutrophils Relative % 90 %   Neutro Abs 14.4 (H) 1.7 - 7.7 K/uL   Lymphocytes Relative 5 %   Lymphs Abs 0.9 0.7 - 4.0 K/uL   Monocytes Relative 4 %   Monocytes Absolute 0.6 0.1 - 1.0 K/uL   Eosinophils Relative 0 %   Eosinophils Absolute 0.0 0.0 - 0.5 K/uL   Basophils Relative 0 %   Basophils Absolute 0.0 0.0 - 0.1 K/uL   Immature Granulocytes 1 %   Abs Immature Granulocytes 0.10 (H) 0.00 - 0.07 K/uL  Comprehensive metabolic panel   Collection Time: 02/12/24  7:25 PM  Result Value Ref Range   Sodium 137 135 - 145 mmol/L   Potassium 3.6 3.5 - 5.1 mmol/L   Chloride 105 98 - 111 mmol/L   CO2 20 (L) 22 - 32 mmol/L   Glucose, Bld 102 (H) 70 - 99 mg/dL   BUN 16 6 - 20 mg/dL   Creatinine, Ser 0.86 (L) 0.44 - 1.00 mg/dL   Calcium  8.9 8.9 - 10.3 mg/dL   Total Protein 7.7 6.5 - 8.1 g/dL   Albumin 3.9 3.5 - 5.0 g/dL   AST 53 (H) 15 - 41 U/L   ALT 162 (H) 0 - 44 U/L   Alkaline Phosphatase 50 38 - 126 U/L   Total Bilirubin 0.7 0.0 - 1.2 mg/dL   GFR, Estimated >57 >84 mL/min   Anion  gap 12 5 - 15  hCG, quantitative, pregnancy   Collection Time: 02/12/24  7:25 PM  Result Value Ref Range   hCG, Beta Chain, Quant, S 204,399 (H) <5 mIU/mL  Urinalysis, Routine w reflex microscopic -Urine, Clean Catch   Collection Time: 02/13/24 12:11 AM  Result Value Ref Range   Color, Urine YELLOW (A) YELLOW   APPearance CLEAR (A) CLEAR   Specific Gravity, Urine 1.030 1.005 - 1.030   pH 6.0 5.0 - 8.0   Glucose, UA >=500 (A) NEGATIVE mg/dL   Hgb urine dipstick NEGATIVE NEGATIVE   Bilirubin Urine NEGATIVE NEGATIVE   Ketones, ur 20 (A) NEGATIVE mg/dL   Protein, ur NEGATIVE NEGATIVE mg/dL   Nitrite NEGATIVE NEGATIVE   Leukocytes,Ua NEGATIVE NEGATIVE   RBC / HPF 0-5 0 - 5 RBC/hpf   WBC, UA 0-5 0 - 5 WBC/hpf   Bacteria, UA NONE SEEN NONE SEEN   Squamous Epithelial / HPF 0-5 0 - 5 /HPF   Mucus PRESENT   Lipase, blood   Collection Time: 02/13/24  8:10 AM  Result  Value Ref Range   Lipase 30 11 - 51 U/L  Magnesium    Collection Time: 02/13/24  8:10 AM  Result Value Ref Range   Magnesium  1.7 1.7 - 2.4 mg/dL  TSH   Collection Time: 02/13/24  8:10 AM  Result Value Ref Range   TSH 0.184 (L) 0.350 - 4.500 uIU/mL  T4, free   Collection Time: 02/13/24  8:10 AM  Result Value Ref Range   Free T4 0.71 0.61 - 1.12 ng/dL    Treatments: IV hydration  Hospital Course:  This is a 28 year old G3P2002 with an IUP at 11 weeks and 6 days, who was admitted for hyperemesis.  She was treated with a banana bag and antiemetics. An early ultrasound conducted upon admission indicated a normal early pregnancy. This morning, she expressed her wish to go home today and requested an advancement of her diet, denying any nausea or vomiting since her visit to the ED yesterday. She refused the complete obstetric panel labs and the assessment of fetal heart tones as ordered for every shift. She mentioned that she still intends to terminate the pregnancy and that the facility she has selected performs abortions up to  14 weeks. She successfully consumed 5 orange juices, 2 pieces of bacon, and a few bites of eggs without experiencing vomiting. She was considered stable for discharge to home with outpatient follow-up.  Discharge Physical Exam:  BP 102/64 (BP Location: Left Arm)   Pulse 63   Temp 98.3 F (36.8 C) (Oral)   Resp 18   Ht 5\' 1"  (1.549 m)   Wt 58.2 kg   LMP 11/29/2023 (Exact Date)   SpO2 99%   BMI 24.26 kg/m   General: NAD CV: RRR Pulm: CTABL, nl effort ABD: s/nd/nt, gravid DVT Evaluation: LE non-ttp, no evidence of DVT on exam.  FHT per doppler: patient refused    Discharge Condition: Stable  Disposition:  Discharge disposition: 01-Home or Self Care        Allergies as of 02/13/2024   No Known Allergies      Medication List     TAKE these medications    acetaminophen  500 MG tablet Commonly known as: TYLENOL  Take 2 tablets (1,000 mg total) by mouth every 6 (six) hours as needed.   doxylamine  (Sleep) 25 MG tablet Commonly known as: UNISOM  Take 1 tablet (25 mg total) by mouth at bedtime.   famotidine  20 MG tablet Commonly known as: PEPCID  Take 1 tablet (20 mg total) by mouth 2 (two) times daily.   metoCLOPramide  10 MG tablet Commonly known as: REGLAN  Take 1 tablet (10 mg total) by mouth 4 (four) times daily -  before meals and at bedtime.   ondansetron  4 MG disintegrating tablet Commonly known as: ZOFRAN -ODT Take 1 tablet (4 mg total) by mouth every 8 (eight) hours as needed for nausea or vomiting.   pantoprazole  40 MG tablet Commonly known as: PROTONIX  Take 1 tablet (40 mg total) by mouth daily before breakfast.   promethazine  25 MG tablet Commonly known as: PHENERGAN  Take 1 tablet (25 mg total) by mouth every 6 (six) hours as needed for nausea or vomiting.   pyridOXINE  25 MG tablet Commonly known as: VITAMIN B6 Take 1 tablet (25 mg total) by mouth at bedtime.         Signed:  Arzella Laurence, CNM 02/13/2024 2:12 PM

## 2024-02-13 NOTE — Progress Notes (Signed)
 Pt declined labs on admit to [facility]. States she would like to wait until 0800. Lab notified and stated they will make a note of pts preferred lab draw time.

## 2024-02-13 NOTE — Progress Notes (Signed)
Patient discharged. Discharge instructions given. Patient verbalizes understanding. Transported by RN. 

## 2024-02-13 NOTE — Progress Notes (Signed)
 Pt refused FHT.

## 2024-02-13 NOTE — ED Notes (Signed)
Report called to beth rn

## 2024-02-13 NOTE — H&P (Addendum)
 HISTORY AND PHYSICAL NOTE  History of Present Illness: Tara Gonzalez is a 28 y.o. G3P2002 at [redacted]w[redacted]d, based on a 9 week ultrasound, is admitted for hyperemesis.  She presented to L&D with complaints of N/V.    Previous admissions for similar symptoms on 01/29/24 and 01/23/24 as well as other ED visits.  She has not yet established OB care anywhere.   Factors complicating pregnancy:  Unwanted pregnancy Hyperemesis gravidarum Anxiety and depression Hypokalemia Bipolar disorder  Prenatal care site:  None established yet  Patient Active Problem List   Diagnosis Date Noted   Hyperemesis 01/29/2024   Hyperemesis gravidarum 01/24/2024   GI bleeding 10/18/2023   Anxiety and depression 10/18/2023   Influenza A 10/18/2023   Acute gastroenteritis 10/17/2023   Exposure to STD 09/30/2020   Acute cholecystitis 06/25/2020   Intractable vomiting 05/20/2017   Grief reaction 09/11/2015   Hypokalemia 08/01/2015   Lower abdominal pain 01/21/2013   Bipolar disorder (HCC) 05/02/2012    Past Medical History:  Diagnosis Date   Anemia    during last pregnancy diagnosed    Anxiety    Bipolar disorder (HCC)    Bipolar disorder (HCC)    Depression    on going started diagnosed as child. not currently on medication    Headache    Pyelonephritis affecting pregnancy in first trimester     Past Surgical History:  Procedure Laterality Date   BIOPSY  10/18/2023   Procedure: BIOPSY;  Surgeon: Marnee Sink, MD;  Location: [facility] ENDOSCOPY;  Service: Endoscopy;;   CHOLECYSTECTOMY N/A 06/25/2020   Procedure: LAPAROSCOPIC CHOLECYSTECTOMY;  Surgeon: Derral Flick, MD;  Location: [facility] ORS;  Service: General;  Laterality: N/A;   ESOPHAGOGASTRODUODENOSCOPY N/A 10/18/2023   Procedure: ESOPHAGOGASTRODUODENOSCOPY (EGD);  Surgeon: Marnee Sink, MD;  Location: [facility] ENDOSCOPY;  Service: Endoscopy;  Laterality: N/A;   TONSILLECTOMY      OB History  Gravida Para Term Preterm AB Living  3 2 2   2   SAB IAB Ectopic  Multiple Live Births     0 2    # Outcome Date GA Lbr Len/2nd Weight Sex Type Anes PTL Lv  3 Current           2 Term 12/08/17 [redacted]w[redacted]d 02:07 / 00:16 3521 g M Vag-Spont EPI  LIV  1 Term 01/12/16 [redacted]w[redacted]d 05:57 / 00:08 2665 g M Vag-Vacuum EPI  LIV    Social History:  reports that she has never smoked. She has never used smokeless tobacco. She reports that she does not currently use alcohol. She reports that she does not use drugs.  Family History: family history includes Asthma in her brother; Autism spectrum disorder in her brother; Congenital Murmur in her maternal grandmother; Diabetes in her maternal grandfather and maternal grandmother; Hyperlipidemia in her mother; Hypertension in her mother.  No Known Allergies  (Not in a hospital admission)   Review of Systems  Constitutional: Negative.   Respiratory: Negative.    Cardiovascular: Negative.   Gastrointestinal:  Positive for nausea and vomiting.  Genitourinary: Negative.   Musculoskeletal: Negative.   Skin: Negative.     Physical Examination: Vitals:  BP (!) 132/90 (BP Location: Right Arm)   Pulse 94   Temp 98.3 F (36.8 C) (Oral)   Resp 19   Ht 5\' 1"  (1.549 m)   Wt 58.2 kg   LMP 11/29/2023 (Exact Date)   SpO2 98%   BMI 24.26 kg/m  General: no acute distress.  HEENT: normocephalic, atraumatic Heart: regular rate & rhythm.  Lungs: normal respiratory effort Abdomen: soft, gravid, non-tender Extremities: non-tender, symmetric, no edema bilaterally.   Neurologic: Alert & oriented x 3.    Labs:  Results for orders placed or performed during the hospital encounter of 02/12/24 (from the past 24 hours)  CBC with Differential   Collection Time: 02/12/24  7:25 PM  Result Value Ref Range   WBC 15.9 (H) 4.0 - 10.5 K/uL   RBC 4.29 3.87 - 5.11 MIL/uL   Hemoglobin 13.3 12.0 - 15.0 g/dL   HCT 10.2 72.5 - 36.6 %   MCV 90.9 80.0 - 100.0 fL   MCH 31.0 26.0 - 34.0 pg   MCHC 34.1 30.0 - 36.0 g/dL   RDW 44.0 34.7 - 42.5 %    Platelets 260 150 - 400 K/uL   nRBC 0.0 0.0 - 0.2 %   Neutrophils Relative % 90 %   Neutro Abs 14.4 (H) 1.7 - 7.7 K/uL   Lymphocytes Relative 5 %   Lymphs Abs 0.9 0.7 - 4.0 K/uL   Monocytes Relative 4 %   Monocytes Absolute 0.6 0.1 - 1.0 K/uL   Eosinophils Relative 0 %   Eosinophils Absolute 0.0 0.0 - 0.5 K/uL   Basophils Relative 0 %   Basophils Absolute 0.0 0.0 - 0.1 K/uL   Immature Granulocytes 1 %   Abs Immature Granulocytes 0.10 (H) 0.00 - 0.07 K/uL  Comprehensive metabolic panel   Collection Time: 02/12/24  7:25 PM  Result Value Ref Range   Sodium 137 135 - 145 mmol/L   Potassium 3.6 3.5 - 5.1 mmol/L   Chloride 105 98 - 111 mmol/L   CO2 20 (L) 22 - 32 mmol/L   Glucose, Bld 102 (H) 70 - 99 mg/dL   BUN 16 6 - 20 mg/dL   Creatinine, Ser 9.56 (L) 0.44 - 1.00 mg/dL   Calcium  8.9 8.9 - 10.3 mg/dL   Total Protein 7.7 6.5 - 8.1 g/dL   Albumin 3.9 3.5 - 5.0 g/dL   AST 53 (H) 15 - 41 U/L   ALT 162 (H) 0 - 44 U/L   Alkaline Phosphatase 50 38 - 126 U/L   Total Bilirubin 0.7 0.0 - 1.2 mg/dL   GFR, Estimated >38 >75 mL/min   Anion gap 12 5 - 15  hCG, quantitative, pregnancy   Collection Time: 02/12/24  7:25 PM  Result Value Ref Range   hCG, Beta Chain, Quant, S 204,399 (H) <5 mIU/mL    Prenatal Labs: Blood type/Rh O pos  Antibody screen   Rubella   Varicella   RPR   HBsAg   Hep C   HIV   GC   Chlamydia   Genetic screening   1 hour GTT   3 hour GTT   GBS     Imaging Studies: US  OB Comp Less 14 Wks Result Date: 02/12/2024 CLINICAL DATA:  Pelvic pain with known pregnancy, initial encounter EXAM: OBSTETRIC <14 WK ULTRASOUND TECHNIQUE: Transabdominal ultrasound was performed for evaluation of the gestation as well as the maternal uterus and adnexal regions. COMPARISON:  01/24/2024 FINDINGS: Intrauterine gestational sac: Present Yolk sac:  Absent Embryo:  Present Cardiac Activity: Present Heart Rate: 164 bpm CRL:   55 mm   12 w 1 d                  US  EDC: 08/25/2024  Subchorionic hemorrhage:  None Maternal uterus/adnexae: Ovaries are within normal limits. IMPRESSION: Single live intrauterine gestation at 12 weeks 1 day. Electronically Signed  By: Violeta Grey M.D.   On: 02/12/2024 22:34   US  OB Comp Less 14 Wks Result Date: 01/24/2024 CLINICAL DATA:  [id number] Pelvic pain [id number] EXAM: OBSTETRIC <14 WK ULTRASOUND TECHNIQUE: Transabdominal ultrasound was performed for evaluation of the gestation as well as the maternal uterus and adnexal regions. COMPARISON:  None Available. FINDINGS: Intrauterine gestational sac: Single Yolk sac:  Visualized. Embryo:  Visualized. Cardiac Activity: Visualized. Heart Rate: 158 bpm CRL: 23.3  mm   9 w 0 d                  US  EDC: 08/28/2024. Subchorionic hemorrhage:  None visualized. Maternal uterus/adnexae: Within normal limits. IMPRESSION: *Single live intrauterine gestation with crown-rump length corresponding to 9 weeks 0 day. Electronically Signed   By: Beula Brunswick M.D.   On: 01/24/2024 08:05    Assessment and Plan: Patient Active Problem List   Diagnosis Date Noted   Hyperemesis 01/29/2024   Hyperemesis gravidarum 01/24/2024   GI bleeding 10/18/2023   Anxiety and depression 10/18/2023   Influenza A 10/18/2023   Acute gastroenteritis 10/17/2023   Exposure to STD 09/30/2020   Acute cholecystitis 06/25/2020   Intractable vomiting 05/20/2017   Grief reaction 09/11/2015   Hypokalemia 08/01/2015   Lower abdominal pain 01/21/2013   Bipolar disorder (HCC) 05/02/2012    1. Admit to Antenatal - Admit for observation for Hyperemesis - IV fluids with Thiamine , folic acid , and MVI - Alternating Compazine  and Reglan  every 3 hours - NPO - Pepcid  - Strict I&Os - Collected lab work - Doppler heart tones q shift  Aron Lard, CNM  Certified Nurse Midwife [facility]

## 2024-04-24 ENCOUNTER — Telehealth: Payer: Self-pay

## 2024-04-24 DIAGNOSIS — O210 Mild hyperemesis gravidarum: Secondary | ICD-10-CM

## 2024-04-24 NOTE — Progress Notes (Signed)
 Complex Care Management Note Care Guide Note  04/24/2024 Name: Tara Gonzalez MRN: [id number] DOB: 06/12/1996   Complex Care Management Outreach Attempts: An unsuccessful telephone outreach was attempted today to offer the patient information about available complex care management services.  Follow Up Plan:  No further outreach attempts will be made at this time. We have been unable to contact the patient to offer or enroll patient in complex care management services.  Encounter Outcome:  Patient Refused  Leotis Rase [facility], [facility] Guide  Direct Dial: [phone]  Fax [phone]
